# Patient Record
Sex: Male | Born: 1945 | Race: Black or African American | Hispanic: No | Marital: Single | Smoking: Current every day smoker
Health system: Southern US, Community
[De-identification: ages and names within clinical notes are randomized; demographics above are authoritative.]

## PROBLEM LIST (undated history)

## (undated) DIAGNOSIS — F1010 Alcohol abuse, uncomplicated: Secondary | ICD-10-CM

## (undated) DIAGNOSIS — E7211 Homocystinuria: Secondary | ICD-10-CM

## (undated) DIAGNOSIS — I10 Essential (primary) hypertension: Secondary | ICD-10-CM

## (undated) DIAGNOSIS — K219 Gastro-esophageal reflux disease without esophagitis: Secondary | ICD-10-CM

## (undated) DIAGNOSIS — I771 Stricture of artery: Secondary | ICD-10-CM

## (undated) DIAGNOSIS — I739 Peripheral vascular disease, unspecified: Secondary | ICD-10-CM

## (undated) DIAGNOSIS — N19 Unspecified kidney failure: Secondary | ICD-10-CM

## (undated) DIAGNOSIS — D126 Benign neoplasm of colon, unspecified: Secondary | ICD-10-CM

## (undated) DIAGNOSIS — Z720 Tobacco use: Secondary | ICD-10-CM

## (undated) DIAGNOSIS — E538 Deficiency of other specified B group vitamins: Secondary | ICD-10-CM

## (undated) DIAGNOSIS — N281 Cyst of kidney, acquired: Secondary | ICD-10-CM

## (undated) DIAGNOSIS — N289 Disorder of kidney and ureter, unspecified: Secondary | ICD-10-CM

## (undated) DIAGNOSIS — A048 Other specified bacterial intestinal infections: Secondary | ICD-10-CM

## (undated) DIAGNOSIS — I82409 Acute embolism and thrombosis of unspecified deep veins of unspecified lower extremity: Secondary | ICD-10-CM

## (undated) HISTORY — DX: Peripheral vascular disease, unspecified: I73.9

## (undated) HISTORY — PX: BACK SURGERY: SHX140

## (undated) HISTORY — DX: Gastro-esophageal reflux disease without esophagitis: K21.9

## (undated) HISTORY — DX: Disorder of kidney and ureter, unspecified: N28.9

## (undated) HISTORY — DX: Stricture of artery: I77.1

## (undated) HISTORY — DX: Tobacco use: Z72.0

## (undated) HISTORY — DX: Other specified bacterial intestinal infections: A04.8

## (undated) HISTORY — DX: Essential (primary) hypertension: I10

## (undated) HISTORY — DX: Cyst of kidney, acquired: N28.1

## (undated) HISTORY — DX: Acute embolism and thrombosis of unspecified deep veins of unspecified lower extremity: I82.409

## (undated) HISTORY — DX: Unspecified kidney failure: N19

## (undated) HISTORY — DX: Deficiency of other specified B group vitamins: E53.8

## (undated) HISTORY — DX: Alcohol abuse, uncomplicated: F10.10

## (undated) HISTORY — DX: Benign neoplasm of colon, unspecified: D12.6

## (undated) HISTORY — DX: Homocystinuria: E72.11

---

## 2006-03-24 ENCOUNTER — Inpatient Hospital Stay (HOSPITAL_COMMUNITY): Admission: EM | Admit: 2006-03-24 | Discharge: 2006-04-02 | Payer: Self-pay | Admitting: Emergency Medicine

## 2006-04-16 ENCOUNTER — Ambulatory Visit (HOSPITAL_COMMUNITY): Admission: RE | Admit: 2006-04-16 | Discharge: 2006-04-16 | Payer: Self-pay | Admitting: Urology

## 2006-05-12 ENCOUNTER — Ambulatory Visit (HOSPITAL_COMMUNITY): Admission: RE | Admit: 2006-05-12 | Discharge: 2006-05-12 | Payer: Self-pay | Admitting: Urology

## 2010-03-16 DIAGNOSIS — D126 Benign neoplasm of colon, unspecified: Secondary | ICD-10-CM

## 2010-03-16 DIAGNOSIS — I82409 Acute embolism and thrombosis of unspecified deep veins of unspecified lower extremity: Secondary | ICD-10-CM

## 2010-03-16 HISTORY — DX: Benign neoplasm of colon, unspecified: D12.6

## 2010-03-16 HISTORY — DX: Acute embolism and thrombosis of unspecified deep veins of unspecified lower extremity: I82.409

## 2010-06-11 ENCOUNTER — Inpatient Hospital Stay (HOSPITAL_COMMUNITY)
Admission: EM | Admit: 2010-06-11 | Discharge: 2010-06-13 | Disposition: A | Discharge status: Other Acute Inpatient Hospital | Payer: Medicaid Other | Source: Home / Self Care

## 2010-06-11 ENCOUNTER — Emergency Department (HOSPITAL_COMMUNITY): Payer: Medicaid Other

## 2010-06-11 DIAGNOSIS — I129 Hypertensive chronic kidney disease with stage 1 through stage 4 chronic kidney disease, or unspecified chronic kidney disease: Secondary | ICD-10-CM | POA: Diagnosis present

## 2010-06-11 DIAGNOSIS — I824Y9 Acute embolism and thrombosis of unspecified deep veins of unspecified proximal lower extremity: Secondary | ICD-10-CM | POA: Diagnosis present

## 2010-06-11 DIAGNOSIS — L98499 Non-pressure chronic ulcer of skin of other sites with unspecified severity: Secondary | ICD-10-CM | POA: Diagnosis present

## 2010-06-11 DIAGNOSIS — L02419 Cutaneous abscess of limb, unspecified: Secondary | ICD-10-CM | POA: Diagnosis present

## 2010-06-11 DIAGNOSIS — N179 Acute kidney failure, unspecified: Secondary | ICD-10-CM | POA: Diagnosis not present

## 2010-06-11 DIAGNOSIS — L03119 Cellulitis of unspecified part of limb: Secondary | ICD-10-CM | POA: Diagnosis present

## 2010-06-11 DIAGNOSIS — F17200 Nicotine dependence, unspecified, uncomplicated: Secondary | ICD-10-CM | POA: Diagnosis present

## 2010-06-11 DIAGNOSIS — F1010 Alcohol abuse, uncomplicated: Secondary | ICD-10-CM | POA: Diagnosis present

## 2010-06-11 DIAGNOSIS — I739 Peripheral vascular disease, unspecified: Secondary | ICD-10-CM | POA: Diagnosis present

## 2010-06-11 DIAGNOSIS — N189 Chronic kidney disease, unspecified: Secondary | ICD-10-CM | POA: Diagnosis present

## 2010-06-11 DIAGNOSIS — K219 Gastro-esophageal reflux disease without esophagitis: Secondary | ICD-10-CM | POA: Diagnosis present

## 2010-06-11 DIAGNOSIS — L97309 Non-pressure chronic ulcer of unspecified ankle with unspecified severity: Secondary | ICD-10-CM | POA: Diagnosis present

## 2010-06-11 DIAGNOSIS — L97809 Non-pressure chronic ulcer of other part of unspecified lower leg with unspecified severity: Secondary | ICD-10-CM | POA: Diagnosis present

## 2010-06-11 DIAGNOSIS — N183 Chronic kidney disease, stage 3 unspecified: Secondary | ICD-10-CM | POA: Diagnosis present

## 2010-06-11 LAB — CBC
HCT: 42.6 % (ref 39.0–52.0)
MCH: 32.4 pg (ref 26.0–34.0)
MCV: 97.9 fL (ref 78.0–100.0)
Platelets: 319 10*3/uL (ref 150–400)
RBC: 4.35 MIL/uL (ref 4.22–5.81)
RDW: 15.2 % (ref 11.5–15.5)
WBC: 5.3 10*3/uL (ref 4.0–10.5)

## 2010-06-11 LAB — BASIC METABOLIC PANEL
CO2: 25 mEq/L (ref 19–32)
Calcium: 9.6 mg/dL (ref 8.4–10.5)
Chloride: 104 mEq/L (ref 96–112)
Creatinine, Ser: 1.57 mg/dL — ABNORMAL HIGH (ref 0.4–1.5)
Potassium: 4.7 mEq/L (ref 3.5–5.1)

## 2010-06-11 LAB — DIFFERENTIAL
Eosinophils Relative: 3 % (ref 0–5)
Lymphocytes Relative: 26 % (ref 12–46)
Monocytes Relative: 10 % (ref 3–12)
Neutro Abs: 3.2 10*3/uL (ref 1.7–7.7)
Neutrophils Relative %: 60 % (ref 43–77)

## 2010-06-11 LAB — PROTIME-INR: INR: 0.91 (ref 0.00–1.49)

## 2010-06-11 NOTE — H&P (Addendum)
NAME:  Justin Gilmore, Justin Gilmore NO.:  [id number]  MEDICAL RECORD NO.:  [id number]           PATIENT TYPE:  E  LOCATION:  ED                            FACILITY:  [facility]  PHYSICIAN:  Talmage Nap, MD  DATE OF BIRTH:  [birth year]  DATE OF ADMISSION:  06/11/2010 DATE OF DISCHARGE:  LH                             HISTORY & PHYSICAL   PRIMARY CARE PHYSICIAN:  Unassigned and history obtainable from the patient and the patient's family.  CHIEF COMPLAINT:  Swelling and redness of the lower extremities on and off for about a month.  HISTORY OF PRESENT ILLNESS:  The patient is a 65 year old African American male with no known medical history presenting to the emergency room with swelling of the lower extremity with redness which according to family member has been on and off for about a month and this was said to have gotten progressively worse in the past 5 days prior to presenting to the emergency room.  Baseline, the patient is able to ambulate, however, with the progression of the swelling of the lower extremity the patient was having extreme difficulty trying to ambulate. He, however, denied any history of chest pain.  He denied any history of shortness of breath.  He denied any systemic symptoms i.e. no fever, no chills, no rigor.  He was initially treated by the orthopedic surgeon as an outpatient but the swelling and the redness continued to get progressively worse, hence he was advised by the orthopedic surgeon to come to the emergency room to be evaluated.  PAST MEDICAL HISTORY:  None.  PAST SURGICAL HISTORY:  Cyst excision from the lower back.  MEDICATIONS:  He is not on any medications for now.  ALLERGIES:  He has no known allergies.  SOCIAL HISTORY:  The patient smokes about two packs of cigarettes per day in the past 30 years and also takes hard liquor and regular alcohol and is currently on disability.  FAMILY HISTORY:  Positive for hypertension and  coronary artery disease.  REVIEW OF SYSTEMS:  The patient denies any history of headaches.  No blurred vision.  No nausea or vomiting.  No fever.  No chills.  No rigor.  Denies any chest pain or shortness of breath.  No cough.  No abdominal discomfort.  No diarrhea or hematochezia.  No dysuria or hematuria.  Has swelling of the right lower extremity with difficulty in ambulation with a lot of erythema.  No intolerance to heat or cold and no neuropsychiatric disorder.  PHYSICAL EXAMINATION:  GENERAL:  Elderly man not in any obvious respiratory distress looking slightly unkempt. VITAL SIGNS:  Blood pressure is 172/94, pulse is 62, respiratory rate 20, temperature is 98.5. HEENT:  Pupils are reactive to light and extraocular muscles are intact. NECK:  No jugular venous distention.  No carotid bruit.  No lymphadenopathy. CHEST:  Clear to auscultation. HEART:  Sounds are 1 and 2. ABDOMEN:  Soft, nontender.  Liver, spleen and kidney not palpable. Bowel sounds are positive. EXTREMITIES:  Swelling of the right lower leg with irregular erythema slightly warm to touch.  Denna Haggard' sign not  elicited.  Slight tenderness in the calf. NEUROPSYCHIATRIC:  Unremarkable. SKIN:  Normal turgor. MUSCULOSKELETAL:  Arthritic changes in the knees and in the feet.  LABORATORY DATA:  Initial anticoagulation profile done on the patient showed PT 12.5, INR 0.91.  Hematological indices showed WBC of 5.3, hemoglobin of 14.1, hematocrit of 42.6, MCV of 97.1, platelet count of 309, neutrophil is 60, and absolute granulocyte count is 3.2.  Chemistry showed sodium of 136, potassium of 4.7, chloride of 104 with a bicarb of 75, glucose is 93, BUN is 17, creatinine is 1.57.  Imaging studies done on the patient include a venous duplex of the right lower extremity and showed partial occlusive mural thrombus through the superficial femoral vein and occlusive thrombus in the distal, superficial and femoral vein, partial  occlusive mural thrombus in the popliteal vein and the posterior tibial vein, extensive DVT of the femoral popliteal veins.  Chest x-ray showed hyperaeration, no active lung disease.  There is a vague nodular opacification at the right lung base which may be due to overlapping bony and vascular structure and there is coronary artery calcification.  IMPRESSION: 1. Deep vein thrombosis right leg. 2. Venous stasis right lower leg. 3. Cellulitis of the right lower leg. 4. Elevated blood pressure. 5. Chronic tobacco use.  PLAN:  To admit the patient to general medical floor.  The patient will be anticoagulated with Lovenox 1 mg per kg subcu q. 12 hourly and Coumadin p.o. dosing to be done by pharmacy, pain control will be done with Dilaudid 1 mg IV q. 4 p.r.n.  The patient's venous stasis will be treated with Lasix 40 mg IV q. 24 start dose and this will be followed by hydrochlorothiazide 25 mg p.o. daily.  He will also be on Zosyn 3.375 grams IV q. 8 hourly for treatment of cellulitis.  Since the patient expressed his willingness to quit smoking, he will be on nicotine patch 14 mg q.24 hours transdermal and he will also be given Protonix 40 mg p.o. daily for GI prophylaxis.  Further workup to be done on this patient will include repeating coagulation profile that is PT/PTT, INR in a.m., CBCD, CMP and magnesium will be repeated in a.m.  The patient will be followed and evaluated on a day-to-day basis.     Talmage Nap, MD     CN/MEDQ  D:  06/11/2010  T:  06/11/2010  Job:  [id number]  Electronically Signed by Talmage Nap  on 06/11/2010 11:39:11 PM

## 2010-06-12 ENCOUNTER — Inpatient Hospital Stay (HOSPITAL_COMMUNITY): Payer: Medicaid Other

## 2010-06-12 LAB — COMPREHENSIVE METABOLIC PANEL
ALT: 9 U/L (ref 0–53)
Albumin: 2.9 g/dL — ABNORMAL LOW (ref 3.5–5.2)
Alkaline Phosphatase: 65 U/L (ref 39–117)
CO2: 21 mEq/L (ref 19–32)
Chloride: 107 mEq/L (ref 96–112)
Creatinine, Ser: 1.57 mg/dL — ABNORMAL HIGH (ref 0.4–1.5)
GFR calc Af Amer: 54 mL/min — ABNORMAL LOW (ref >60)
GFR calc non Af Amer: 45 mL/min — ABNORMAL LOW (ref >60)
Sodium: 137 mEq/L (ref 135–145)
Total Protein: 6.2 g/dL (ref 6.0–8.3)

## 2010-06-12 LAB — DIFFERENTIAL
Basophils Absolute: 0 10*3/uL (ref 0.0–0.1)
Lymphocytes Relative: 23 % (ref 12–46)
Neutro Abs: 3 10*3/uL (ref 1.7–7.7)
Neutrophils Relative %: 64 % (ref 43–77)

## 2010-06-12 LAB — CBC
Hemoglobin: 13.3 g/dL (ref 13.0–17.0)
MCHC: 32.8 g/dL (ref 30.0–36.0)
RDW: 15.2 % (ref 11.5–15.5)

## 2010-06-13 ENCOUNTER — Inpatient Hospital Stay (HOSPITAL_COMMUNITY)
Admission: EM | Admit: 2010-06-13 | Discharge: 2010-06-23 | DRG: 253 | Disposition: A | Discharge status: Home Health | Payer: Medicaid Other | Source: Other Acute Inpatient Hospital | Attending: Family Medicine | Admitting: Family Medicine

## 2010-06-13 ENCOUNTER — Encounter: Payer: Self-pay | Admitting: Vascular Surgery

## 2010-06-13 LAB — BASIC METABOLIC PANEL
CO2: 24 mEq/L (ref 19–32)
Chloride: 103 mEq/L (ref 96–112)
Creatinine, Ser: 1.78 mg/dL — ABNORMAL HIGH (ref 0.4–1.5)
GFR calc non Af Amer: 39 mL/min — ABNORMAL LOW (ref >60)
Glucose, Bld: 108 mg/dL — ABNORMAL HIGH (ref 70–99)
Potassium: 4.2 mEq/L (ref 3.5–5.1)

## 2010-06-13 LAB — CBC
HCT: 39 % (ref 39.0–52.0)
Hemoglobin: 13 g/dL (ref 13.0–17.0)
MCH: 32.3 pg (ref 26.0–34.0)
MCHC: 33.3 g/dL (ref 30.0–36.0)
MCV: 97 fL (ref 78.0–100.0)
RBC: 4.02 MIL/uL — ABNORMAL LOW (ref 4.22–5.81)

## 2010-06-13 LAB — DIFFERENTIAL
Basophils Absolute: 0 10*3/uL (ref 0.0–0.1)
Basophils Relative: 0 % (ref 0–1)
Eosinophils Relative: 3 % (ref 0–5)
Monocytes Absolute: 0.8 10*3/uL (ref 0.1–1.0)
Neutro Abs: 4.2 10*3/uL (ref 1.7–7.7)
Neutrophils Relative %: 62 % (ref 43–77)

## 2010-06-13 LAB — PROTIME-INR
INR: 1.03 (ref 0.00–1.49)
Prothrombin Time: 13.7 seconds (ref 11.6–15.2)

## 2010-06-14 DIAGNOSIS — I824Z9 Acute embolism and thrombosis of unspecified deep veins of unspecified distal lower extremity: Secondary | ICD-10-CM

## 2010-06-14 DIAGNOSIS — L97309 Non-pressure chronic ulcer of unspecified ankle with unspecified severity: Secondary | ICD-10-CM

## 2010-06-14 LAB — URINALYSIS, ROUTINE W REFLEX MICROSCOPIC
Hgb urine dipstick: NEGATIVE
Nitrite: NEGATIVE
Protein, ur: NEGATIVE mg/dL
Specific Gravity, Urine: 1.015 (ref 1.005–1.030)
Urobilinogen, UA: 0.2 mg/dL (ref 0.0–1.0)

## 2010-06-14 LAB — PROTIME-INR
INR: 1.25 (ref 0.00–1.49)
Prothrombin Time: 15.9 s — ABNORMAL HIGH (ref 11.6–15.2)

## 2010-06-14 LAB — BASIC METABOLIC PANEL
Chloride: 105 mEq/L (ref 96–112)
Creatinine, Ser: 1.63 mg/dL — ABNORMAL HIGH (ref 0.4–1.5)
GFR calc Af Amer: 52 mL/min — ABNORMAL LOW (ref >60)
GFR calc non Af Amer: 43 mL/min — ABNORMAL LOW (ref >60)
Glucose, Bld: 110 mg/dL — ABNORMAL HIGH (ref 70–99)
Potassium: 4.3 mEq/L (ref 3.5–5.1)

## 2010-06-14 LAB — CBC
HCT: 37.4 % — ABNORMAL LOW (ref 39.0–52.0)
Hemoglobin: 12.4 g/dL — ABNORMAL LOW (ref 13.0–17.0)
MCH: 31.6 pg (ref 26.0–34.0)
MCHC: 33.2 g/dL (ref 30.0–36.0)
MCV: 95.2 fL (ref 78.0–100.0)
RBC: 3.93 MIL/uL — ABNORMAL LOW (ref 4.22–5.81)
RDW: 14.5 % (ref 11.5–15.5)

## 2010-06-14 LAB — LIPID PANEL
Cholesterol: 160 mg/dL (ref 0–200)
HDL: 55 mg/dL (ref >39)
Total CHOL/HDL Ratio: 2.9 RATIO
VLDL: 20 mg/dL (ref 0–40)

## 2010-06-15 DIAGNOSIS — I739 Peripheral vascular disease, unspecified: Secondary | ICD-10-CM

## 2010-06-15 DIAGNOSIS — L98499 Non-pressure chronic ulcer of skin of other sites with unspecified severity: Secondary | ICD-10-CM

## 2010-06-15 DIAGNOSIS — I824Z9 Acute embolism and thrombosis of unspecified deep veins of unspecified distal lower extremity: Secondary | ICD-10-CM

## 2010-06-15 DIAGNOSIS — L97309 Non-pressure chronic ulcer of unspecified ankle with unspecified severity: Secondary | ICD-10-CM

## 2010-06-15 LAB — CBC
MCV: 95.8 fL (ref 78.0–100.0)
Platelets: 249 10*3/uL (ref 150–400)
RBC: 3.55 MIL/uL — ABNORMAL LOW (ref 4.22–5.81)
RDW: 14.4 % (ref 11.5–15.5)
WBC: 7 10*3/uL (ref 4.0–10.5)

## 2010-06-15 LAB — BASIC METABOLIC PANEL
CO2: 23 mEq/L (ref 19–32)
Calcium: 8.3 mg/dL — ABNORMAL LOW (ref 8.4–10.5)
Creatinine, Ser: 1.49 mg/dL (ref 0.4–1.5)
GFR calc Af Amer: 58 mL/min — ABNORMAL LOW (ref >60)
GFR calc non Af Amer: 48 mL/min — ABNORMAL LOW (ref >60)
Sodium: 133 mEq/L — ABNORMAL LOW (ref 135–145)

## 2010-06-15 LAB — PROTIME-INR: Prothrombin Time: 15.4 seconds — ABNORMAL HIGH (ref 11.6–15.2)

## 2010-06-16 DIAGNOSIS — Z86718 Personal history of other venous thrombosis and embolism: Secondary | ICD-10-CM

## 2010-06-16 DIAGNOSIS — I803 Phlebitis and thrombophlebitis of lower extremities, unspecified: Secondary | ICD-10-CM

## 2010-06-16 LAB — CBC
MCH: 31.7 pg (ref 26.0–34.0)
MCHC: 33 g/dL (ref 30.0–36.0)
Platelets: 230 10*3/uL (ref 150–400)
RBC: 3.5 MIL/uL — ABNORMAL LOW (ref 4.22–5.81)

## 2010-06-16 LAB — PROTIME-INR
INR: 1.1 (ref 0.00–1.49)
Prothrombin Time: 14.4 seconds (ref 11.6–15.2)

## 2010-06-16 LAB — BASIC METABOLIC PANEL
CO2: 24 mEq/L (ref 19–32)
Creatinine, Ser: 1.51 mg/dL — ABNORMAL HIGH (ref 0.4–1.5)

## 2010-06-16 NOTE — Consult Note (Signed)
NAME:  Justin Gilmore, Justin Gilmore NO.:  [id number]  MEDICAL RECORD NO.:  [id number]           PATIENT TYPE:  I  LOCATION:  5508                         FACILITY:  [facility]  PHYSICIAN:  Quita Skye. Hart Rochester, M.D.  DATE OF BIRTH:  Aug 02, 1945  DATE OF CONSULTATION:  06/14/2010 DATE OF DISCHARGE:                                CONSULTATION   CHIEF COMPLAINT:  Nonhealing ulcer right ankle and recent deep vein thrombosis right leg.  HISTORY OF PRESENT ILLNESS:  A 65 year old male patient states that he has been having swelling in the right leg for the last 4-6 weeks and developed an ulcer on the lateral aspect of his right ankle about the same time period.  He denies trauma to this area or previous ulcerations.  He does admit to severe claudication in the right leg limiting him to walking 1-2 blocks over the last several months.  He has no symptoms on the contralateral left leg.  He was admitted to [facility] where a venous duplex exam revealed a DVT in the right superficial, femoral and popliteal veins and he was started on anticoagulation.  He also had an arterial evaluation with an ABI of 0.24 on the right and 0.84 on the left with evidence of right superficial femoral occlusion on CT angiogram.  He was referred for further evaluation.  CHRONIC MEDICAL PROBLEMS: 1. Hypertension. 2. Tobacco abuse.  Admits to 1-2 packs of cigarettes per day for 50+     years. 3. History of renal cyst. 4. Renal dysfunction, creatinine at 1.5-1.8 range. 5. Negative for coronary artery disease, diabetes or stroke.  SOCIAL HISTORY:  The patient admits to 1-2 packs cigarettes per day. Drinks frequent alcohol and beer primarily.  FAMILY HISTORY:  Positive for diabetes in a brother.  Negative for coronary artery disease or stroke.  REVIEW OF SYSTEMS:  The patient denies any chest pain, dyspnea on exertion, PND, orthopnea, or chronic bronchitis.  Does have severe right leg pain as noted  with ambulation.  Denies all other symptoms in a complete review of systems.  PHYSICAL EXAMINATION:  VITAL SIGNS:  Blood pressure is 140/70, heart rate 70, respirations 14, temperature 98. GENERAL:  He is a thin African American male in no apparent distress, alert and oriented x3. HEENT:  Normal for age.  EOMs intact. LUNGS:  Clear to auscultation.  No rhonchi or wheezing. CARDIOVASCULAR:  Regular rhythm.  No murmurs.  Carotid pulses are 3+. No audible bruits. ABDOMEN:  Soft, nontender with no masses. NEUROLOGIC:  Normal. MUSCULOSKELETAL:  Free of major deformities. LOWER EXTREMITY:  Left leg has a 2+ femoral, popliteal and dorsalis pedis pulse palpable.  Right leg has a 2+ femoral, absent popliteal and distal pulses.  There is a dry eschar adjacent to the right lateral malleolus measuring 2.5 x 4 cm with no exposed bone or purulent drainage.  He has multiple superficial pretibial ulcers in the right leg between the knee and the ankle with no pressure sores on the heel.  He does have 1+ to 2+ edema in the right leg from mid thigh to the foot. No  edema in the left leg.  IMPRESSION: 1. Recent onset of deep vein thrombosis in the right leg. 2. Ischemic ulcer right ankle. 3. Renal insufficiency with creatinine clearance of 48 mL per minute. 4. History of tobacco abuse.  PLAN: 1. The patient needs IV hydration to maximize his renal function. 2. We will plan an angiogram with possible intervention by Dr. Myra Gianotti     next week, if feasible to try to salvage his right lower extremity. 3. Repeat a venous duplex exam on Monday to find out current status of     DVT.  The patient is at high risk of requiring an amputation     because of DVT and arterial insufficiency with renal insufficiency     and an eschar adjacent to the lateral malleolus.  Discussed this at     length with the family at side.     Quita Skye Hart Rochester, M.D.     JDL/MEDQ  D:  06/14/2010  T:  06/15/2010  Job:   [id number]  Electronically Signed by Josephina Gip M.D. on 06/16/2010 10:26:06 AM

## 2010-06-17 DIAGNOSIS — I739 Peripheral vascular disease, unspecified: Secondary | ICD-10-CM

## 2010-06-17 DIAGNOSIS — L98499 Non-pressure chronic ulcer of skin of other sites with unspecified severity: Secondary | ICD-10-CM

## 2010-06-17 LAB — BASIC METABOLIC PANEL
CO2: 24 mEq/L (ref 19–32)
Chloride: 107 mEq/L (ref 96–112)
Glucose, Bld: 99 mg/dL (ref 70–99)
Potassium: 3.9 mEq/L (ref 3.5–5.1)
Sodium: 137 mEq/L (ref 135–145)

## 2010-06-18 DIAGNOSIS — I739 Peripheral vascular disease, unspecified: Secondary | ICD-10-CM

## 2010-06-18 DIAGNOSIS — L98499 Non-pressure chronic ulcer of skin of other sites with unspecified severity: Secondary | ICD-10-CM

## 2010-06-18 LAB — CBC
HCT: 31.7 % — ABNORMAL LOW (ref 39.0–52.0)
Hemoglobin: 10.7 g/dL — ABNORMAL LOW (ref 13.0–17.0)
MCH: 31.9 pg (ref 26.0–34.0)
MCHC: 33.8 g/dL (ref 30.0–36.0)
MCV: 94.6 fL (ref 78.0–100.0)
RBC: 3.35 MIL/uL — ABNORMAL LOW (ref 4.22–5.81)

## 2010-06-18 LAB — BASIC METABOLIC PANEL
CO2: 26 mEq/L (ref 19–32)
Calcium: 8.4 mg/dL (ref 8.4–10.5)
Chloride: 105 mEq/L (ref 96–112)
Creatinine, Ser: 1.47 mg/dL (ref 0.4–1.5)
Glucose, Bld: 106 mg/dL — ABNORMAL HIGH (ref 70–99)

## 2010-06-19 LAB — COMPREHENSIVE METABOLIC PANEL
ALT: 25 U/L (ref 0–53)
Alkaline Phosphatase: 63 U/L (ref 39–117)
BUN: 9 mg/dL (ref 6–23)
CO2: 23 mEq/L (ref 19–32)
Calcium: 8.4 mg/dL (ref 8.4–10.5)
GFR calc non Af Amer: 46 mL/min — ABNORMAL LOW (ref >60)
Glucose, Bld: 102 mg/dL — ABNORMAL HIGH (ref 70–99)
Potassium: 3.9 mEq/L (ref 3.5–5.1)
Sodium: 136 mEq/L (ref 135–145)
Total Protein: 5.6 g/dL — ABNORMAL LOW (ref 6.0–8.3)

## 2010-06-19 LAB — CBC
MCH: 31.6 pg (ref 26.0–34.0)
MCHC: 33.2 g/dL (ref 30.0–36.0)
MCV: 95.1 fL (ref 78.0–100.0)
Platelets: 291 10*3/uL (ref 150–400)

## 2010-06-19 LAB — PROTIME-INR: Prothrombin Time: 14.4 seconds (ref 11.6–15.2)

## 2010-06-19 LAB — HEPARIN LEVEL (UNFRACTIONATED): Heparin Unfractionated: 0.3 IU/mL (ref 0.30–0.70)

## 2010-06-20 ENCOUNTER — Other Ambulatory Visit: Payer: Self-pay | Admitting: Vascular Surgery

## 2010-06-20 DIAGNOSIS — I743 Embolism and thrombosis of arteries of the lower extremities: Secondary | ICD-10-CM

## 2010-06-20 DIAGNOSIS — I70219 Atherosclerosis of native arteries of extremities with intermittent claudication, unspecified extremity: Secondary | ICD-10-CM

## 2010-06-20 HISTORY — PX: FEMORAL-POPLITEAL BYPASS GRAFT: SHX937

## 2010-06-20 LAB — CBC
HCT: 31 % — ABNORMAL LOW (ref 39.0–52.0)
Hemoglobin: 10.2 g/dL — ABNORMAL LOW (ref 13.0–17.0)
Hemoglobin: 10.5 g/dL — ABNORMAL LOW (ref 13.0–17.0)
MCH: 32 pg (ref 26.0–34.0)
MCV: 95.4 fL (ref 78.0–100.0)
Platelets: 279 10*3/uL (ref 150–400)
RBC: 3.25 MIL/uL — ABNORMAL LOW (ref 4.22–5.81)
RBC: 3.28 MIL/uL — ABNORMAL LOW (ref 4.22–5.81)
RDW: 14.7 % (ref 11.5–15.5)
WBC: 5.8 10*3/uL (ref 4.0–10.5)

## 2010-06-20 LAB — BASIC METABOLIC PANEL
BUN: 7 mg/dL (ref 6–23)
CO2: 24 mEq/L (ref 19–32)
Calcium: 8.7 mg/dL (ref 8.4–10.5)
Calcium: 8.6 mg/dL (ref 8.4–10.5)
Chloride: 108 mEq/L (ref 96–112)
GFR calc Af Amer: 59 mL/min — ABNORMAL LOW (ref >60)
GFR calc non Af Amer: 44 mL/min — ABNORMAL LOW (ref >60)
Glucose, Bld: 115 mg/dL — ABNORMAL HIGH (ref 70–99)
Glucose, Bld: 116 mg/dL — ABNORMAL HIGH (ref 70–99)
Potassium: 3.9 mEq/L (ref 3.5–5.1)
Potassium: 4 mEq/L (ref 3.5–5.1)
Sodium: 137 mEq/L (ref 135–145)

## 2010-06-20 LAB — HEMOGLOBIN A1C: Mean Plasma Glucose: 120 mg/dL — ABNORMAL HIGH (ref <117)

## 2010-06-20 LAB — MRSA PCR SCREENING: MRSA by PCR: NEGATIVE

## 2010-06-20 LAB — GLUCOSE, CAPILLARY: Glucose-Capillary: 107 mg/dL — ABNORMAL HIGH (ref 70–99)

## 2010-06-20 LAB — SURGICAL PCR SCREEN: MRSA, PCR: NEGATIVE

## 2010-06-21 LAB — BASIC METABOLIC PANEL
BUN: 6 mg/dL (ref 6–23)
Calcium: 8.5 mg/dL (ref 8.4–10.5)
Chloride: 105 mEq/L (ref 96–112)
Creatinine, Ser: 1.56 mg/dL — ABNORMAL HIGH (ref 0.4–1.5)
GFR calc Af Amer: 55 mL/min — ABNORMAL LOW (ref >60)
GFR calc non Af Amer: 45 mL/min — ABNORMAL LOW (ref >60)

## 2010-06-22 LAB — COMPREHENSIVE METABOLIC PANEL
ALT: 16 U/L (ref 0–53)
AST: 16 U/L (ref 0–37)
CO2: 24 mEq/L (ref 19–32)
Calcium: 8.7 mg/dL (ref 8.4–10.5)
Creatinine, Ser: 1.73 mg/dL — ABNORMAL HIGH (ref 0.4–1.5)
GFR calc Af Amer: 49 mL/min — ABNORMAL LOW (ref >60)
GFR calc non Af Amer: 40 mL/min — ABNORMAL LOW (ref >60)
Sodium: 135 mEq/L (ref 135–145)
Total Protein: 5.8 g/dL — ABNORMAL LOW (ref 6.0–8.3)

## 2010-06-22 LAB — CBC
Platelets: 268 10*3/uL (ref 150–400)
RBC: 3.01 MIL/uL — ABNORMAL LOW (ref 4.22–5.81)
RDW: 15.1 % (ref 11.5–15.5)
WBC: 8.3 10*3/uL (ref 4.0–10.5)

## 2010-06-22 LAB — CROSSMATCH
ABO/RH(D): O POS
Unit division: 0

## 2010-06-22 LAB — PROTIME-INR
INR: 1.12 (ref 0.00–1.49)
Prothrombin Time: 14.6 seconds (ref 11.6–15.2)

## 2010-06-23 LAB — BASIC METABOLIC PANEL
BUN: 8 mg/dL (ref 6–23)
CO2: 24 mEq/L (ref 19–32)
Chloride: 103 mEq/L (ref 96–112)
GFR calc Af Amer: 52 mL/min — ABNORMAL LOW (ref >60)
Potassium: 3.7 mEq/L (ref 3.5–5.1)

## 2010-06-23 LAB — CBC
HCT: 28.7 % — ABNORMAL LOW (ref 39.0–52.0)
Hemoglobin: 9.5 g/dL — ABNORMAL LOW (ref 13.0–17.0)
MCHC: 33.1 g/dL (ref 30.0–36.0)
MCV: 93.5 fL (ref 78.0–100.0)

## 2010-06-23 LAB — PROTIME-INR: INR: 1.17 (ref 0.00–1.49)

## 2010-06-28 NOTE — Discharge Summary (Signed)
NAME:  Justin Gilmore, Justin Gilmore NO.:  [id number]  MEDICAL RECORD NO.:  [id number]           PATIENT TYPE:  LOCATION:                                 FACILITY:  PHYSICIAN:  Pleas Koch, MD        DATE OF BIRTH:  02/09/1946  DATE OF ADMISSION:  06/11/2010 DATE OF DISCHARGE:  06/23/2010                              DISCHARGE SUMMARY   PERTINENT DISCHARGE DIAGNOSES: 1. Right lower extremity deep vein thrombosis. 2. Day #3 status post right common femoral artery endarterectomy and     extended profundoplasty with right common femoral artery to above-     knee popliteal bypass with probe patent graft. 3. Possible cellulitis of the above. 4. Gastroesophageal reflux disease. 5. Diabetes mellitus, A1c 5.8. 6. Hypertension, moderately controlled. 7. Significant alcohol and tobacco abuse in the past. 8. 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  PERTINENT PROCEDURES DONE: 1. Ultrasound accessed left femoral artery. 2. Abdominal aortogram. 3. Right common femoral artery endarterectomy with extended     profundoplasty. 4. Right common artery to above knee popliteal bypass pin probe by Dr.     Leonides Sake dated June 20, 2010.  DISCHARGE MEDICATIONS:  Are as follow: 1. Lovenox 80 mg b.i.d. for quantity sufficient for 5 days,     prescription written. 2. NicoDerm patch 14 mg over 24 hours daily. 3. Nicotine patch 14 mg over 24 hours, quantity sufficient for 1     month, 30 given. 4. Metoprolol 50 mg p.o. b.i.d., Lopressor. 5. Bisacodyl 5 mg tablets, take 10 mg daily p.r.n., quantity     sufficient given for 1 month. 6. Amlodipine 10 mg 1 tablet daily. 7. Keflex 500 mg b.i.d.  Diagnosis, cellulitis.  The patient is to     take this for 4 days and quantity would be 8 and to complete a 7-     day course of treatment. 8. Oxycodone 5 mg IR q.4 p.r.n.  The patient was given a 5-day     prescription of this, which would equal 30 tablets. 9. Tramadol 50 mg tablets p.o. q.6 p.r.n.   The patient was given a 14-     day prescription of this  equalling quantity of 54 tablets. 10.The patient is instructed to discontinue taking Naprosyn.  The     patient is instructed to take collagenous ointment one application     b.i.d. to lower extremity, quantity sufficient for 5 days. 11.Hydrochlorothiazide 25 mg 1 tablet daily.  PERTINENT IMAGING STUDIES: 1. Ultrasound venous Doppler done May 13, 2010, showed extensive     femoral popliteal DVT, probably significant chronic component.     There is partial occlusive mural thrombus through the superficial     femoral vein.  Occlusive thrombus and distal superficial femoral     vein.  Partially occlusive mural thrombus popliteal vein, posterior     tibial vein.  Chest x-ray two-view on June 11, 2010, equal sign,     hyperaeration, no active disease. 2. Vague nodular opacity of right lung base, may be due to overlapping     bony and  vascular structures.  Recommend followup x-ray to assess     stability. 3. Coronary artery calcifications. 4. 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  The patient     failed conservative treatment, consider CT and abdominal, aorta,     and lower extremities to better define side and nature of the     arterial occlusive disease and delineate treatment options.  BRIEF HOSPITAL COURSE:  Briefly, this is a pleasant 65 year old male with no diagnosed medical diseases in the past, who has been having lower extremity swelling on and off for about a month that got worse over the past 5 days.  He presented to the emergency room at [facility] and [facility] pain and was referred to Dr. Hilda Lias, who referred him then to the emergency room subsequently.  The patient has already been scheduled to have Vascular Surgery see him, but never made the appointment  and it was noted that on the lateral malleolus, he had black eschar with no drainage measuring about 3 x 4 cm area proximal to that about 4.5 x 2 cm and the third in the pretibial region measuring 4.5 x 5 cm.  The patient had tight shiny skin, significant erythema and warmth, and arterial Doppler done showed DVT.  Arterial studies also confirmed arterial disease as well.  He is placed on Lasix and metoprolol and was on Lovenox, but subsequently was discussed by Dr. Lendell Caprice and Dr. Hart Rochester and subsequently transferred over to [facility] for further management and care.  The patient stayed on at [facility] until June 20, 2010, when he got the procedure as mentioned above.  HOSPITAL COURSE: 1. Day 3 status post endarterectomy and profundoplasty.  The patient     had been seen by Dr. Imogene Burn who recommended the patient can go home.     The patient will continue on Santyl ointment.  The patient will     also need to get Keflex for empiric coverage.  It is noted that the     patient is ambulating well, but will need PT/OT.  Dr. Imogene Burn has     reported to me that the patient will probably need revisit of lower     extremity and may need surgical debridement versus amputation in     the future.  This will have to be discussed with him and the family     as an outpatient.  The patient is stable from our standpoint and     will continue on Santyl to debride the area. 2. Right lower extremity DVT.  His INR is still subtherapeutic at     1.17, and will need overlap supply for 4 days.  He will start     Coumadin on discharge, may be 5 mg and follow up with PCP as an     outpatient.  The patient will need an INR in 3-5 days to determine     level of what his anticoagulation would be.  I have discussed this     with family and with the patient extensively, and the patient will     need to be on Coumadin at least for 6 months. 3. Cellulitis.  I have discussed this with Dr. Imogene Burn and he feels that      the patient should be on empiric coverage for cellulitis as an  outpatient just for a 7-day course subsequent to antibiotics.     Please note the patient is on vanc and Zosyn on admission and     continue this until the day of surgery. 4. Gastroesophageal reflux.  The patient has a history of this and can     take over-the-counter Tums. 5. Hypertension.  The patient will continue on Norvasc 10 OD,     metoprolol 50 b.i.d., and hydrochlorothiazide 25 mg daily as he has     had a history of accelerated hypertension in the past. 6. Ethanol and tobacco abuse.  Ethanol abuse, the patient actually     requested alcohol while in the hospital and we agreed to this and     gave him 1 dose of whiskey to bedside.  The patient has been on     tobacco patch while here and will need tobacco and alcohol     counseling as an outpatient.  He will get tobacco counseling prior     to discharge.  I have mentioned to him that smoking and drinking     would cause him to have peripheral vascular disease.  The patient     is understanding same.  The patient is discharged in stable state.     Temperature was 99, pulse was 60, respirations were 19-20, and     blood pressures 143-147/68-71. 7. CKD, stage II to III.  His renal function was relatively stable     while in the hospital.  His last BUN/creatinine was 8/1.62, this is     likely attributable to his poorly-controlled hypertension.  It was a pleasure taking care of this patient.          ______________________________ Pleas Koch, MD     JS/MEDQ  D:  06/23/2010  T:  06/23/2010  Job:  [id number]  Electronically Signed by Pleas Koch MD on 06/28/2010 07:16:12 AM

## 2010-07-01 NOTE — Op Note (Signed)
NAME:  Justin Gilmore, MCSHAN NO.:  [id number]  MEDICAL RECORD NO.:  [id number]           PATIENT TYPE:  LOCATION:                                 FACILITY:  PHYSICIAN:  Fransisco Hertz, MD       DATE OF BIRTH:  04/17/1945  DATE OF PROCEDURE:  06/20/2010 DATE OF DISCHARGE:                              OPERATIVE REPORT   PROCEDURES: 1. Right common femoral artery endarterectomy with extended     profundoplasty. 2. A right common femoral artery to above-knee popliteal bypass with     probe patent graft.  PREOPERATIVE DIAGNOSIS:  Peripheral arterial disease and nonhealing ulcers of left lower leg.  POSTOPERATIVE DIAGNOSIS:  Peripheral arterial disease and nonhealing ulcers of left lower leg.  SURGEON:  Arlys John L. Imogene Burn, MD  ASSISTANT:  Pecola Leisure, PA  ANESTHESIA:  General.  FINDINGS: dopplerable right posterior tibial and anterior tibial.  SPECIMEN:  Plaque which was sent to Pathology.  ESTIMATED BLOOD LOSS:  200 mL.  INDICATIONS:  This is a 65 year old gentleman that presented with non-healing ulcers in the right leg.  He underwent an angiogram which demonstrated an occlusion of his right superficial femoral artery with above-knee reconstitution.  He underwent vein mapping in his left thigh. His greater saphenous vein, however, was found to be of inadequate caliber throughout.  So it was felt that his only bypass option would be a right common femoral artery endarterectomy with likely extension into the profunda and then a bypass from the newly patched common femoral artery down to the above-knee popliteal artery.  I discussed with this patient preoperatively that with the extensive amount of ulceration he had in the lower leg that there was no guarantee that these wounds would heal even with additional blood flow and in fact it may not be possible to salvage this leg but without an attempt at bypass, there would not be any chance of salvaging this right  lower leg and would need a below-knee  amputation.  He is aware of the risks of this procedure include bleeding, infection, death, myocardial infarction, stroke, possible infection of the prosthetic graft, and possible need for additional procedures.  He is aware of the risks and agreed to proceed forward with such.  DESCRIPTION OF THE OPERATION:  After full informed written consent was obtained from the patient, he was brought back to the operating room and placed supine upon the operating table.  Prior to induction, he had received IV antibiotics.  He was then prepped and draped in the standard fashion for right leg bypass procedure. I turned my attention first to his right groin.  A longitudinal incision was made over the common femoral artery, and I dissected down to the artery with blunt dissection and electrocautery, tying off any crossing veins in the process.  We were able to identify the common femoral artery from the inguinal ligament level down to its femoral bifurcation where we dissected out circumflex branches and then controlled these with vessel loops.  I was able to dissect down to the first bifurcation of the profunda artery and placed vessel  loops around these branches, also had control of the external iliac artery and also the superficial femoral artery.  At this point, I turned my attention to the above-knee exposure, I made an incision in the distal thigh, over Hunter canal.  I dissected down to the artery, retracting the sartorius out of the way,  I was able to get down to the above-knee popliteal artery.  It was actually noted to be soft here.  I dissected the veins off, tying off any crossing branches.  I had controlled this segment of the artery proximally and distally with vessel loops.  At this point, I turned my attention back to the groin.  I gave the patient's 6000 units of heparin intravenously to obtain therapeutic anticoagulation.  After waiting 3  minutes, I clamped the external iliac artery and placed the circumflex branches and the profunda branches under tension.  I made an arteriotomy with a 11-blade and extended it with Potts scissor.  Note, there was no bleeding whatsoever from the superficial femoral artery.  The clot in the common femoral artery appeared to have relatively fresh thrombus with some organization.  I extended the arteriotomy up into the more proximal common femoral artery.  Using a Cytogeneticist, I was able to eventually dissect the plaque which was heavily calcified and adherent to the wall circumferentially. I carried this dissection into a portion of the plaque which was densely adherent to the wall and the more proximal common femoral artery.  I did not feel the extension of this dissection into the external iliac artery was needed as this plaque was extensively adherent to the posterior wall and there was a greater than 80% of the lumen still present even with this plaque present.  I then carried out this dissection distally down to the superficial femoral artery.  There was absolutely no flow through the SFA.  I then carried this dissection into the profunda artery where it feathered out well.  Note that the plaque had signs of recent clots and also organized clot on it along with calcified plaque in the posterior aspect.  We then spent about 10 minutes removing intimal debris.  Note that the artery was extremely thinned out in the midportion of the common femoral artery, but still of adequate substance for use as the inflow target and did not need replacement with interposition graft.  I irrigated out this artery. There was no more loose intimal flaps.  I allowed all the arteries to back bleed, there was a good backbleeding from the profunda branches. No backbleeding from the superficial femoral artery.  The circumflex arteries did bleed and there was pulsatile bleeding from the external iliac  artery.  I reclamped everything and I washed out this artery.  Then, I took a bovine pericardial patch that I previously been soaking and fashioned it to the geometry of this arteriotomy.  Note, I had extended the arteriotomy into the profunda to help ensure that I had completed the endarterectomy successfully into the profunda.  There was no more residual intimal flaps extending into the first order profunda branches.  I then took this bovine patch and sewed it from above and below extending the patch angioplasty onto the profunda, thus performing a profundoplasty.  Also this was sewn in place with a stitch of 5-0 Prolene from above and a 6-0 Prolene from beneath tying in the middle.  Prior to completing the patch angioplasty, I allowed all vessels to back bleed or antegrade bleed. There was  good bleeding throughout.  I injected heparinized saline at this point, and I completed the patch angioplasty in the usual fashion.  At this point, I released all clamps and immediately there was a good pulse in this patched common femoral artery extending down into the profunda. Using a continuous Doppler, I interrogated the profunda branches.  There was good pulsatile triphasic flow into the profunda.  At this point, I obtained a Gore tunneler and tunneled from the medial incision in a subsartorial fashion up into the groin and then through this tunnel, placed a 6-mm Propatene graft.  This had been stretched to a full length and then I removed the tunnel leaving the graft in place.  The proximal end of this graft was spatulated for about a 8-mm long anastomosis.  I then made an incision in the bovine patch near the femoral bifurcation and extended it proximally with a Potts scissor to meet the geometry of the proximal graft.  The graft was sewn to the patched artery with a running stitch of 5-0 Prolene in an end to side configuration.   I completed this anastomosis without any difficulties and then I  clamped the graft distally in the thigh incision.  At this point, I released all clamps and then allowed the graft to pressurize.  There was good pulsatile bleeding in the graft.  At this point, I then clamped the graft near its arterial anastomosis and placed thrombin and Gelfoam around the anastomoses to treat the needle holes.  Around this time, approximately an hour had passed, so I gave another 2000 units of heparin to continue the anticoagulation.  I then adjusted the tension on this graft to the anterior incision and then I cut the graft to appropriate length, spatulating in the process for another about 7-mm arteriotomy.  I then clamped the above-the-knee popliteal artery, proximally and distally, and removed the vessel loops.  I made an arteriotomy in the popliteal artery with a 11-blade  and extended it with Potts scissors.  I allowed the artery to bleed in an  antegrade and retrograde fashion.  There was backbleeding from both ends.  I then  sewed the graft to the artery in end-to-side fashion using a running stitch of 6-0 Prolene.  Prior to completing this anastomosis, I allowed all arteries to back bleed and the graft to bleed in an antegrade fashion.  There was good pulsatile bleeding in the graft.  I have flushed out the anastomosis with heparin and I completed the anastomosis in the usual fashion.  Then I released all clamps and immediately there was resumption of pulsatile flow in the graft and also in the above-knee popliteal artery.  Using a Doppler, I was able to obtain multiphasic flow in the above-knee popliteal artery. With the Doppler, I checked the foot.   There was at least a biphasic posterior tibial and a strongly monophasic anterior tibial.  At this point, I irrigated out all wounds and placed on some more thrombin and Gelfoam in all wounds.  After waiting a few minutes, we looked for any bleeding points which were controlled with electrocautery, irrigated out  the wound another time, and placed some more thrombin Gelfoam.  After waiting an additional 3 minutes, there was no more active bleeding for any wounds.  We then closed the groin with a double layer of 2-0 Vicryl and then a layer of 3-0 Vicryl and the skin was closed with running subcuticular 4-0 Monocryl and then closure was reinforced with Dermabond  and then Coverall dressings. Sterile dressing was applied for the thigh incision.  A single layer of 3-0 Vicryl was placed and then a layer of 4-0 Monocryl was placed in a subcuticular fashion to close the skin.  The skin was then reinforced with Dermabond and then after allowing to dry, Coverall sterile dressing was applied.  The ulcerations in the right foot were just wrapped tightly with a Kerlix taking care to leave the foot open for Doppler examination.  The patient was allowed to awaken without any difficulties with the plan to readmit to the step-down unit for observation overnight.  COMPLICATIONS:  None.  CONDITION:  Stable.     Fransisco Hertz, MD     BLC/MEDQ  D:  06/20/2010  T:  06/21/2010  Job:  [id number]  Electronically Signed by Leonides Sake MD on 07/01/2010 10:16:59 AM

## 2010-07-02 NOTE — Discharge Summary (Signed)
NAME:  Justin Gilmore, Justin Gilmore                 ACCOUNT NO.:  [id number]  MEDICAL RECORD NO.:  [id number]           PATIENT TYPE:  I  LOCATION:  A318                          FACILITY:  [facility]  PHYSICIAN:  Kawon Willcutt L. Lendell Caprice, MDDATE OF BIRTH:  October 28, 1945  DATE OF ADMISSION:  06/11/2010 DATE OF DISCHARGE:  03/30/2012LH                         DISCHARGE SUMMARY-REFERRING   DISCHARGE DIAGNOSES: 1. Right leg deep venous thrombosis. 2. Peripheral arterial disease of the right leg with several ischemic     ulcers. 3. Tobacco abuse. 4. Malignant hypertension, new diagnosis. 5. Renal insufficiency, suspect chronic kidney disease. 6. Right leg cellulitis, improved.  MEDICATIONS:  At the time of transfer include; 1. Lovenox 80 mg subcutaneously b.i.d. 2. Vancomycin IV per pharmacy. 3. Metoprolol 50 mg p.o. b.i.d. 4. Nicotine patch 14 mg daily. 5. Warfarin has been stopped, but he has received 2 doses, currently     normal INR and some p.r.n. medications.  DIET:  Heart healthy.  ACTIVITY:  Ad lib, but keep his right leg elevated when seated or in bed.  CONDITION AT TRANSFER:  Stable.  PROCEDURES:  None.  LABORATORY DATA:  CBC on admission normal.  INR on admission was 0.91, PTT 37.  INR today is 1.03.  Complete metabolic panel on admission significant for a creatinine of 1.57, creatinine today is 1.78 after several doses of Lasix and hydrochlorothiazide, which have been stopped. Magnesium normal.  Fasting lipid panel and basic metabolic panel for tomorrow had been ordered.  RADIOLOGY AND SPECIAL STUDIES:  Ultrasound of the right leg showed partially occlusive mural thrombus through the superficial femoral vein, occlusive thrombus in the distal superficial femoral vein, partially occlusive mural thrombus in the popliteal vein and posterior tibial veins.  Visualized portions of the greater saphenous vein are unremarkable.  Chest x-ray showed hyperaeration vague nodular opacity at the right  lung base.  Recommend followup to assess stability.  Coronary artery calcifications; arterial Doppler of the right leg shows irregular monophasic waveform recorded distally, right ABI is 0.24, left leg shows a triphasic waveform distally, left ABI is 0.84.  HISTORY AND HOSPITAL COURSE:  Please see H and P for complete admission details.  Justin Gilmore is a 65 year old black male smoker without a primary care physician who has been having problems with his right leg for quite some time.  He presented to initially an [facility] with right leg swelling and pain.  He also had ulcerations in several areas that started over a month ago.  He was referred to Dr. Hilda Lias who apparently eventually referred him to the emergency room.  Dr. Hilda Lias had scheduled an appointment as an outpatient with Vascular Surgery, but the patient did not make the appointment.  The patient has no known medical history, but has not had a primary care physician for quite some time. He smokes 2 packs of cigarettes a day and was not previously on any medications.  He had a blood pressure in the emergency room of 172/94. He was afebrile.  He had right leg swelling with erythema, warmth.  No ulceration was mentioned in the H and P, but when I  evaluated him the following day, he did in fact have several ulcers.  One is on the right lateral malleolus with a black eschar and no drainage measuring about 3 x 4 cm with an area proximal to that about 4.5 x 2 cm and the third area in the pretibial region measuring 4.5 x 5 cm.  The patient had a tight, shiny skin with significant erythema and warmth.  His toes were hairless and I could not palpate a pulse.  A Doppler was done in the emergency room, which showed DVT.  I ordered arterial studies which confirmed arterial disease as well.  Upon further questioning, the patient reports claudication for the past year or so, but never sought medical care.  He initially was placed on  Lasix and subsequently hydrochlorothiazide, which I have stopped and placed him on metoprolol.  He was on Lovenox on admission as well as a few doses of Coumadin, but his INR has not increased as of yet.  Upon finding the arterial study results, I did discuss the case with Dr. Hart Rochester .  Because of his DVT and renal insufficiency, his case is fairly complicated in terms of workup and optimal treatment.  Also, he reports that the location of the ulcer may be problematic.  He recommends at this time transferred to the hospitalist service at [facility] and he will consult to determine best options for any further workup or treatment.  The patient's cellulitis, edema are much improved and his ulcers remain the same.  Yesterday, he has had about 1-2+ pitting edema with significant erythema involving the mid to distal leg and foot.  Today, he has trace edema if any and minimal erythema.  He has significant desquamative flaking.  I have started him on IV fluids in anticipation for possible angiography or other workup.  He will need fasting lipids.  I will also order an EKG as he has evidence of coronary artery calcifications on chest x-ray.  He has had no cardiac symptoms in the past or present.  He does wish to quit smoking and currently has a nicotine patch in place.  Total time today is 90 minutes.  The patient understands the plan and is agreeable.     Justin Gilmore L. Lendell Caprice, MD     CLS/MEDQ  D:  06/13/2010  T:  06/13/2010  Job:  [id number]  cc:   Teola Bradley, M.D. Fax: [phone]  Dr. Hart Rochester  Electronically Signed by Crista Curb MD on 07/02/2010 09:34:39 PM

## 2010-07-11 ENCOUNTER — Ambulatory Visit (INDEPENDENT_AMBULATORY_CARE_PROVIDER_SITE_OTHER): Payer: Self-pay | Admitting: Vascular Surgery

## 2010-07-11 DIAGNOSIS — I824Z9 Acute embolism and thrombosis of unspecified deep veins of unspecified distal lower extremity: Secondary | ICD-10-CM

## 2010-07-11 NOTE — Assessment & Plan Note (Signed)
OFFICE VISIT  Justin Gilmore, Justin Gilmore DOB:  February 13, 1946                                       07/11/2010 ZOXWR#:[id number]  This is Gilmore postoperative visit.  HISTORY OF PRESENT ILLNESS:  This patient underwent Gilmore right common femoral artery endarterectomy with extended profundoplasty with Gilmore right common femoral artery to above knee pop bypass with Propaten on 06/20/2010 who presents for followup.  No fever or chills.  He does note some drainage from his above the knee pop exposure.  It is serous.  No pus.  He has been undergoing wound care with some type of clinic at [facility] and they have been debriding his wounds serially with good result.  PHYSICAL EXAMINATION:  He had Gilmore blood pressure 161/81, Gilmore heart rate of 54, respirations were 12.  Focused exam:  The right groin incisions are mostly healed.  There is firm tissue here consistent with postoperative scar tissue.  No frankly ballotable hematoma here.  The above the knee popliteal exposure feels slightly fluctuant.  There is some serous fluid leaking there.  We also took down the dressings in his lower leg.  He has at least three big ulcerations.  The anterior one has recently been debrided and actually has good granulation tissue in it.  The medial one remains about the same with some evidence of some granulation tissue. The posterior ulceration still has some eschar on top of it and this is the next ulcer that will be debrided at this wound care clinic.  In his lower foot he has Gilmore weakly palpable dorsalis pedis and also Gilmore posterior tibial.  There are no other ischemic changes in this foot.  The foot feels warm.  MEDICAL DECISION MAKING:  This is Gilmore 65 year old gentleman status post extended right femoral artery endarterectomy and profundoplasty and then Gilmore bypass from the femoral artery to above knee popliteal artery with Propaten.  Given the presence of likely seroma in the popliteal exposure I would go ahead  and proceed with antibiotics as Gilmore precaution.  He is going to continue wound care and we will see him back in about 3 weeks to check on the wounds.  Meanwhile, he is going to continue to follow up with his wound care specialist for continued debridement on these ulcers.  I am hopeful that he will be able to heal this though as I noted previously there is no guarantee that he is going to be able to heal the full extent of the ulcers that he already had present prior to the bypass procedure.  The next followup in addition we will obtain some ABIs and also Gilmore duplex on the graft to verify patency of the graft.    Fransisco Hertz, MD Electronically Signed  BLC/MEDQ  D:  07/11/2010  T:  07/11/2010  Job:  [id number]

## 2010-07-16 DIAGNOSIS — I824Z9 Acute embolism and thrombosis of unspecified deep veins of unspecified distal lower extremity: Secondary | ICD-10-CM

## 2010-07-21 ENCOUNTER — Encounter: Payer: Self-pay | Admitting: Vascular Surgery

## 2010-07-28 NOTE — Op Note (Signed)
  NAME:  Justin Gilmore, Justin Gilmore                 ACCOUNT NO.:  [id number]  MEDICAL RECORD NO.:  [id number]           PATIENT TYPE:  I  LOCATION:  5508                         FACILITY:  [facility]  PHYSICIAN:  Juleen China IV, MDDATE OF BIRTH:  22-Mar-1945  DATE OF PROCEDURE:  06/17/2010 DATE OF DISCHARGE:                              OPERATIVE REPORT   PREOPERATIVE DIAGNOSIS:  Right leg ulcer.  POSTOPERATIVE DIAGNOSIS:  Right leg ulcer.  PROCEDURE PERFORMED: 1. Ultrasound access left femoral artery. 2. Abdominal aortogram. 3. Right leg runoff. 4. Second order catheterization.  INDICATIONS:  This is a 65 year old gentleman with right foot ulcer.  He has subacute DVT on the right and renal insufficiency comes today for arteriogram and possible intervention.  PROCEDURE:  The patient was identified in the holding area and taken to room 8, placed supine on the table.  Both groins were prepped and draped in usual fashion.  Time-out was called.  The left femoral artery was evaluated with ultrasound and found to be widely patent.  It was accessed under ultrasound guidance with an 18-gauge needle.  An 0.035 wire was then advanced into the iliac system where I met resistance and a 5-French sheath was placed.  A Kumpe catheter was used to navigate the wire into the aorta, and an Omni flush catheter was advanced to the level of L1 and abdominal aortogram was obtained.  Next, the catheters were pulled down the aortic bifurcation and distal aortogram was performed.  I then crossed the aortic bifurcation with an Omni flush catheter and Bentson wire.  Catheter was placed in the right external iliac artery and right leg runoff was performed.  FINDINGS:  Aortogram:  Visualized portions of the suprarenal abdominal aorta showed no significant disease.  There was no evidence of renal artery stenosis.  The infrarenal abdominal aorta is widely patent.  It is heavily calcified.  There is approximately 60%  stenosis within the left common iliac artery.  The left external iliac artery is widely patent.  The left hypogastric artery is not visualized.  Right common iliac artery is widely patent.  Right lower extremity:  The right common femoral artery is occluded. The proximal right profunda femoral artery is occluded.  The right superficial femoral artery is occluded with reconstitution in the mid thigh.  The popliteal artery is patent throughout its course.  There is three-vessel runoff to the foot.  After the above images were obtained, decision was made to terminate the procedure.  Catheters and wires were removed.  The patient taken to the holding area for sheath pull.  IMPRESSION: 1. Left common iliac stenosis. 2. Right common femoral artery occlusion with concomitant proximal     right profunda occlusion and right superficial femoral artery     occlusion with reconstitution of the mid thigh. 3. Three-vessel runoff of the right leg.     Juleen China IV, MD     VWB/MEDQ  D:  06/17/2010  T:  06/18/2010  Job:  [id number] Electronically Signed by Arelia Longest IV MD on 07/28/2010 10:53:28 PM

## 2010-08-08 ENCOUNTER — Encounter (INDEPENDENT_AMBULATORY_CARE_PROVIDER_SITE_OTHER): Payer: Medicaid Other

## 2010-08-08 ENCOUNTER — Ambulatory Visit (INDEPENDENT_AMBULATORY_CARE_PROVIDER_SITE_OTHER): Payer: Self-pay | Admitting: Vascular Surgery

## 2010-08-08 DIAGNOSIS — L98499 Non-pressure chronic ulcer of skin of other sites with unspecified severity: Secondary | ICD-10-CM

## 2010-08-08 DIAGNOSIS — I739 Peripheral vascular disease, unspecified: Secondary | ICD-10-CM

## 2010-08-08 DIAGNOSIS — Z48812 Encounter for surgical aftercare following surgery on the circulatory system: Secondary | ICD-10-CM

## 2010-08-12 NOTE — Assessment & Plan Note (Signed)
OFFICE VISIT  MCCLELLAN, DEMARAIS A DOB:  10-30-1945                                       08/08/2010 ZOXWR#:[id number]  Post-operative note  HISTORY OF PRESENT ILLNESS:  This is a 65 year old gentleman who is now status post right common femoral artery to above-knee popliteal bypass with Propaten graft for limb salvage.  He had multiple ulcers on his right foot that I had concerns were not going to be able to heal.  He notes at this point now most of his ulcers on the foot are healed except for one on his dorsum of his foot which is in the process of healing. Notes some pain.  No further drainage from his popliteal exposure site. Denies any fevers or chills at this point.  PHYSICAL EXAMINATION:  On physical exam examination today, he had a blood pressure of 144/76, heart rate of 48, temperature was 98.0.  On focused exam, the right above the knee popliteal exposure is completely healed at this point.  His previous ulcers are all healed except for an ulcer on his dorsum of his right foot.  This has good granulation in it and is almost of the level of the skin, so this should reepithelialized quickly soon.  He has a weakly palpable DP and PT on this side.  Warm foot.  He had noninvasive studies of the bilateral lower extremity.  ABI on the right side 1.05,  left is 1.06.  The right posterior tibials look to be hyperemic and likely multiphasic, however, due to hyperemia appears be above the baseline.  I suspect this patient has multiphasic flow but with the elevated waveforms are above baseline due to hyperemia, so I cannot absolutely say such.  He also had a graft duplex that demonstrates it is a widely patent endarterectomized common femoral segment.  The inflow into this is at 170 cm/sec, so the inflow itself is elevated velocity at 259 v/dat the proximal stenosis and distally 316 cm/sec.  This does not meet the criteria for x3.0 even though it is greater  than 300 c/s.  MEDICAL DECISION MAKING:  This is a 65 year old gentleman who is recovering now slightly past about 6 weeks after his right common femoral artery endarterectomy with extended profundoplasty and bypass from that down to above-knee popliteal segment.  He demonstrates some fluid around the graft on the ultrasound.  He is not clinically presenting with any evidence of graft infection.  I suspect this is residual serous fluid from the surgery.  He is healing up his ulcers well and I suspect the one residual ulcer should be fully reepithelialized within a couple weeks.  He is going to continue Santyl and wet-to-dry dressings to his right foot.  Otherwise will see this gentleman in 2 weeks.  He will follow up with [place] for arterial duplex studies and I will continue to follow this graft closely.    Fransisco Hertz, MD Electronically Signed  BLC/MEDQ  D:  08/08/2010  T:  08/12/2010  Job:  [id number]

## 2010-08-15 NOTE — Procedures (Unsigned)
BYPASS GRAFT EVALUATION  INDICATION:  Right CFA to AK popliteal graft performed 06/20/2010.  HISTORY: Diabetes:  No. Cardiac:  No. Hypertension:  Yes. Smoking:  Yes. Previous Surgery:  Fem-pop graft.  SINGLE LEVEL ARTERIAL EXAM                              RIGHT              LEFT Brachial:                    126                139 Anterior tibial:             146                148 Posterior tibial:            138                160 Peroneal: Ankle/brachial index:        1.05               1.15  PREVIOUS ABI:  Date:  NA  RIGHT:  LEFT:  LOWER EXTREMITY BYPASS GRAFT DUPLEX EXAM:  DUPLEX:  Waveforms on the right ABI are abnormal likely due to postsurgical hyperemia.  The bilateral ABI is within normal limits. Widely patent right fem-pop graft with elevated velocities of the anastomoses, however, no hyperplasia or restenosis is observed. There is serous fluid surrounding the entire graft.  IMPRESSION:  Widely patent right femoral-popliteal graft as described above.   ___________________________________________ Fransisco Hertz, MD  LT/MEDQ  D:  08/08/2010  T:  08/08/2010  Job:  [id number]

## 2010-09-09 ENCOUNTER — Other Ambulatory Visit (HOSPITAL_COMMUNITY): Payer: Self-pay | Admitting: Oncology

## 2010-09-09 ENCOUNTER — Encounter (HOSPITAL_COMMUNITY): Payer: Medicaid Other | Attending: Oncology

## 2010-09-09 DIAGNOSIS — Z86718 Personal history of other venous thrombosis and embolism: Secondary | ICD-10-CM | POA: Insufficient documentation

## 2010-09-09 DIAGNOSIS — D509 Iron deficiency anemia, unspecified: Secondary | ICD-10-CM | POA: Insufficient documentation

## 2010-09-09 DIAGNOSIS — I82409 Acute embolism and thrombosis of unspecified deep veins of unspecified lower extremity: Secondary | ICD-10-CM

## 2010-09-09 LAB — PROTIME-INR
INR: 2.13 — ABNORMAL HIGH (ref 0.00–1.49)
Prothrombin Time: 24.2 seconds — ABNORMAL HIGH (ref 11.6–15.2)

## 2010-09-11 ENCOUNTER — Other Ambulatory Visit (HOSPITAL_COMMUNITY): Payer: Self-pay | Admitting: Oncology

## 2010-09-11 ENCOUNTER — Encounter (HOSPITAL_COMMUNITY): Payer: Medicaid Other

## 2010-09-11 DIAGNOSIS — I82509 Chronic embolism and thrombosis of unspecified deep veins of unspecified lower extremity: Secondary | ICD-10-CM

## 2010-09-11 LAB — CBC
HCT: 34.4 % — ABNORMAL LOW (ref 39.0–52.0)
Hemoglobin: 11 g/dL — ABNORMAL LOW (ref 13.0–17.0)
RBC: 3.89 MIL/uL — ABNORMAL LOW (ref 4.22–5.81)

## 2010-09-11 LAB — DIFFERENTIAL
Basophils Absolute: 0.1 10*3/uL (ref 0.0–0.1)
Eosinophils Absolute: 0.3 10*3/uL (ref 0.0–0.7)
Lymphs Abs: 1.6 10*3/uL (ref 0.7–4.0)
Monocytes Absolute: 0.5 10*3/uL (ref 0.1–1.0)
Neutro Abs: 2.6 10*3/uL (ref 1.7–7.7)

## 2010-09-11 LAB — PROTIME-INR
INR: 2.63 — ABNORMAL HIGH (ref 0.00–1.49)
Prothrombin Time: 28.5 seconds — ABNORMAL HIGH (ref 11.6–15.2)

## 2010-09-11 LAB — RETICULOCYTES
RBC.: 3.89 MIL/uL — ABNORMAL LOW (ref 4.22–5.81)
Retic Ct Pct: 1.9 % (ref 0.4–3.1)

## 2010-09-11 LAB — D-DIMER, QUANTITATIVE: D-Dimer, Quant: 0.54 ug/mL-FEU — ABNORMAL HIGH (ref 0.00–0.48)

## 2010-09-12 LAB — FERRITIN: Ferritin: 20 ng/mL — ABNORMAL LOW (ref 22–322)

## 2010-09-12 LAB — VITAMIN B12: Vitamin B-12: 228 pg/mL (ref 211–911)

## 2010-09-12 LAB — FOLATE: Folate: 2.4 ng/mL — ABNORMAL LOW

## 2010-09-19 ENCOUNTER — Encounter (HOSPITAL_COMMUNITY): Payer: Medicaid Other | Attending: Oncology

## 2010-09-19 ENCOUNTER — Other Ambulatory Visit (HOSPITAL_COMMUNITY): Payer: Self-pay | Admitting: Oncology

## 2010-09-19 ENCOUNTER — Encounter (HOSPITAL_COMMUNITY): Payer: Medicaid Other

## 2010-09-19 DIAGNOSIS — Z86718 Personal history of other venous thrombosis and embolism: Secondary | ICD-10-CM | POA: Insufficient documentation

## 2010-09-19 DIAGNOSIS — I82409 Acute embolism and thrombosis of unspecified deep veins of unspecified lower extremity: Secondary | ICD-10-CM

## 2010-09-19 DIAGNOSIS — D509 Iron deficiency anemia, unspecified: Secondary | ICD-10-CM | POA: Insufficient documentation

## 2010-09-19 LAB — PROTIME-INR: INR: 2.3 — ABNORMAL HIGH (ref 0.00–1.49)

## 2010-09-25 ENCOUNTER — Other Ambulatory Visit (HOSPITAL_COMMUNITY): Payer: Self-pay | Admitting: Oncology

## 2010-09-25 ENCOUNTER — Encounter (HOSPITAL_BASED_OUTPATIENT_CLINIC_OR_DEPARTMENT_OTHER): Payer: Medicaid Other

## 2010-09-25 DIAGNOSIS — I82409 Acute embolism and thrombosis of unspecified deep veins of unspecified lower extremity: Secondary | ICD-10-CM

## 2010-09-25 LAB — PROTIME-INR
INR: 2.72 — ABNORMAL HIGH (ref 0.00–1.49)
Prothrombin Time: 29.3 seconds — ABNORMAL HIGH (ref 11.6–15.2)

## 2010-09-26 ENCOUNTER — Other Ambulatory Visit (HOSPITAL_COMMUNITY): Payer: Self-pay | Admitting: *Deleted

## 2010-09-26 DIAGNOSIS — I82409 Acute embolism and thrombosis of unspecified deep veins of unspecified lower extremity: Secondary | ICD-10-CM

## 2010-10-02 ENCOUNTER — Encounter (HOSPITAL_BASED_OUTPATIENT_CLINIC_OR_DEPARTMENT_OTHER): Payer: Medicaid Other

## 2010-10-02 ENCOUNTER — Other Ambulatory Visit (HOSPITAL_COMMUNITY): Payer: Self-pay | Admitting: Oncology

## 2010-10-02 DIAGNOSIS — I82409 Acute embolism and thrombosis of unspecified deep veins of unspecified lower extremity: Secondary | ICD-10-CM

## 2010-10-02 LAB — PROTIME-INR
INR: 3.03 — ABNORMAL HIGH (ref 0.00–1.49)
Prothrombin Time: 31.9 seconds — ABNORMAL HIGH (ref 11.6–15.2)

## 2010-10-02 NOTE — Progress Notes (Signed)
Labs drawn today for pt.  Pt on 5mg  and 7.5 mg alt.  Call pt at [phone]

## 2010-10-02 NOTE — Progress Notes (Signed)
Same dose. PT/INR in one week.  Pt notified to continue same coumadin dose and that we will recheck his pt/inr with his previously scheduled lab appt on Friday 7/27. He verbalized understanding.

## 2010-10-10 ENCOUNTER — Telehealth (HOSPITAL_COMMUNITY): Payer: Self-pay | Admitting: *Deleted

## 2010-10-10 ENCOUNTER — Encounter (HOSPITAL_COMMUNITY): Payer: Medicaid Other

## 2010-10-10 ENCOUNTER — Other Ambulatory Visit (HOSPITAL_COMMUNITY): Payer: Self-pay | Admitting: Oncology

## 2010-10-10 DIAGNOSIS — I82409 Acute embolism and thrombosis of unspecified deep veins of unspecified lower extremity: Secondary | ICD-10-CM

## 2010-10-10 LAB — PROTIME-INR: Prothrombin Time: 29.2 seconds — ABNORMAL HIGH (ref 11.6–15.2)

## 2010-10-10 LAB — CBC
MCV: 91.1 fL (ref 78.0–100.0)
RDW: 21.2 % — ABNORMAL HIGH (ref 11.5–15.5)

## 2010-10-10 LAB — FERRITIN: Ferritin: 156 ng/mL (ref 22–322)

## 2010-10-10 NOTE — Telephone Encounter (Signed)
Message copied by Dennie Maizes on Fri Oct 10, 2010  3:06 PM ------      Message from: Ellouise Newer III      Created: Fri Oct 10, 2010  2:05 PM       Same Coumadin dose.            PT/INR in 2 weeks.

## 2010-10-10 NOTE — Progress Notes (Signed)
Labs drawn today for pt, cbc,ferr.  Patient on 7.5mg  of coud.  Call patient at [phone]

## 2010-10-10 NOTE — Telephone Encounter (Signed)
Message copied by Dennie Maizes on Fri Oct 10, 2010  3:07 PM ------      Message from: Ellouise Newer III      Created: Fri Oct 10, 2010  2:05 PM       Same Coumadin dose.            PT/INR in 2 weeks.

## 2010-10-10 NOTE — Telephone Encounter (Signed)
Spoke with pt, instruct same dose coumadin,rtc in 2 weeks for labs. Kathie Rhodes wrote appt down.

## 2010-10-24 ENCOUNTER — Encounter (HOSPITAL_COMMUNITY): Payer: Medicaid Other | Attending: Oncology

## 2010-10-24 ENCOUNTER — Other Ambulatory Visit (HOSPITAL_COMMUNITY): Payer: Self-pay | Admitting: Oncology

## 2010-10-24 DIAGNOSIS — I82409 Acute embolism and thrombosis of unspecified deep veins of unspecified lower extremity: Secondary | ICD-10-CM

## 2010-10-24 DIAGNOSIS — N289 Disorder of kidney and ureter, unspecified: Secondary | ICD-10-CM | POA: Insufficient documentation

## 2010-10-24 DIAGNOSIS — D509 Iron deficiency anemia, unspecified: Secondary | ICD-10-CM | POA: Insufficient documentation

## 2010-10-24 LAB — PROTIME-INR
INR: 2.94 — ABNORMAL HIGH (ref 0.00–1.49)
Prothrombin Time: 31.1 seconds — ABNORMAL HIGH (ref 11.6–15.2)

## 2010-10-24 NOTE — Progress Notes (Signed)
Labs drawn today for pt.  Patient on 7.5mg  coud.  Call patient at [phone]

## 2010-10-31 ENCOUNTER — Encounter (HOSPITAL_COMMUNITY): Payer: Medicaid Other

## 2010-10-31 ENCOUNTER — Telehealth (HOSPITAL_COMMUNITY): Payer: Self-pay | Admitting: *Deleted

## 2010-10-31 DIAGNOSIS — I82409 Acute embolism and thrombosis of unspecified deep veins of unspecified lower extremity: Secondary | ICD-10-CM

## 2010-10-31 LAB — PROTIME-INR
INR: 4.7 — ABNORMAL HIGH (ref 0.00–1.49)
Prothrombin Time: 44.9 seconds — ABNORMAL HIGH (ref 11.6–15.2)

## 2010-10-31 NOTE — Progress Notes (Signed)
Labs drawn today for pt,  Patient on 5mg  and 7.5 mg alt.  Call patient at [phone]

## 2010-10-31 NOTE — Telephone Encounter (Signed)
Pt. Instructed to take NO coumadin this weekend. Start back on coumadin at 5 mg on Monday night. And pt level Tuesday.

## 2010-11-04 ENCOUNTER — Telehealth (HOSPITAL_COMMUNITY): Payer: Self-pay | Admitting: *Deleted

## 2010-11-04 ENCOUNTER — Encounter (HOSPITAL_COMMUNITY): Payer: Medicaid Other

## 2010-11-04 DIAGNOSIS — I82409 Acute embolism and thrombosis of unspecified deep veins of unspecified lower extremity: Secondary | ICD-10-CM

## 2010-11-04 NOTE — Telephone Encounter (Signed)
Message copied by Dennie Maizes on Tue Nov 04, 2010  5:07 PM ------      Message from: Mariel Sleet, ERIC S      Created: Tue Nov 04, 2010  4:58 PM       Same dose INR Monday.

## 2010-11-04 NOTE — Progress Notes (Signed)
Labs drawn today for pt.  Patient on 5mg  coud qd.  Call patient at [phone]

## 2010-11-05 ENCOUNTER — Telehealth (HOSPITAL_COMMUNITY): Payer: Self-pay | Admitting: *Deleted

## 2010-11-05 NOTE — Telephone Encounter (Signed)
Spoke with pt. Instruct to continue same dose coumadin. RTC Monday 8/27 at 920 am for PT/INR. Pt understands,

## 2010-11-07 ENCOUNTER — Encounter (INDEPENDENT_AMBULATORY_CARE_PROVIDER_SITE_OTHER): Payer: Medicaid Other

## 2010-11-07 ENCOUNTER — Telehealth (HOSPITAL_COMMUNITY): Payer: Self-pay

## 2010-11-07 DIAGNOSIS — I739 Peripheral vascular disease, unspecified: Secondary | ICD-10-CM

## 2010-11-07 DIAGNOSIS — Z48812 Encounter for surgical aftercare following surgery on the circulatory system: Secondary | ICD-10-CM

## 2010-11-07 NOTE — Telephone Encounter (Signed)
OK per Dr. Mariel Sleet to have PT level as scheduled.

## 2010-11-10 ENCOUNTER — Encounter (HOSPITAL_COMMUNITY): Payer: Medicaid Other

## 2010-11-10 ENCOUNTER — Telehealth (HOSPITAL_COMMUNITY): Payer: Self-pay | Admitting: *Deleted

## 2010-11-10 ENCOUNTER — Other Ambulatory Visit (HOSPITAL_COMMUNITY): Payer: Self-pay | Admitting: *Deleted

## 2010-11-10 DIAGNOSIS — I82409 Acute embolism and thrombosis of unspecified deep veins of unspecified lower extremity: Secondary | ICD-10-CM

## 2010-11-10 NOTE — Progress Notes (Signed)
Labs drawn today for pt.  Patient on 5mg  coud.  Call patient at [phone]

## 2010-11-10 NOTE — Telephone Encounter (Signed)
Message left on answering machine as below. 

## 2010-11-10 NOTE — Telephone Encounter (Signed)
Message copied by Dennie Maizes on Mon Nov 10, 2010  4:23 PM ------      Message from: Mariel Sleet, ERIC S      Created: Mon Nov 10, 2010  3:54 PM       Inr in 7 days. Same dose.

## 2010-11-11 ENCOUNTER — Encounter (HOSPITAL_COMMUNITY): Payer: Self-pay | Admitting: Oncology

## 2010-11-11 ENCOUNTER — Encounter (HOSPITAL_BASED_OUTPATIENT_CLINIC_OR_DEPARTMENT_OTHER): Payer: Medicaid Other | Admitting: Oncology

## 2010-11-11 DIAGNOSIS — N289 Disorder of kidney and ureter, unspecified: Secondary | ICD-10-CM | POA: Insufficient documentation

## 2010-11-11 DIAGNOSIS — I771 Stricture of artery: Secondary | ICD-10-CM

## 2010-11-11 DIAGNOSIS — K219 Gastro-esophageal reflux disease without esophagitis: Secondary | ICD-10-CM

## 2010-11-11 DIAGNOSIS — I82409 Acute embolism and thrombosis of unspecified deep veins of unspecified lower extremity: Secondary | ICD-10-CM | POA: Insufficient documentation

## 2010-11-11 DIAGNOSIS — D509 Iron deficiency anemia, unspecified: Secondary | ICD-10-CM

## 2010-11-11 DIAGNOSIS — I10 Essential (primary) hypertension: Secondary | ICD-10-CM

## 2010-11-11 HISTORY — DX: Stricture of artery: I77.1

## 2010-11-11 HISTORY — DX: Gastro-esophageal reflux disease without esophagitis: K21.9

## 2010-11-11 HISTORY — DX: Disorder of kidney and ureter, unspecified: N28.9

## 2010-11-11 HISTORY — DX: Essential (primary) hypertension: I10

## 2010-11-11 NOTE — Patient Instructions (Signed)
[facility]  Discharge Instructions  RECOMMENDATIONS MADE BY THE CONSULTANT AND ANY TEST RESULTS WILL BE SENT TO YOUR REFERRING DOCTOR.         SPECIAL INSTRUCTIONS/FOLLOW-UP:  We will schedule a CT Scan in the next 1-2 weeks. We will do additional labs next week when you are here for your PT level. You will return to see the MD in 2 months.   I acknowledge that I have been informed and understand all the instructions given to me and received a copy. I do not have any more questions at this time, but understand that I may call the [facility]ty [facility] at [facility] at [phone] during business hours should I have any further questions or need assistance in obtaining follow-up care.    __________________________________________  _____________  __________ Signature of Patient or Authorized Representative            Date                   Time    __________________________________________ Nurse's Signature

## 2010-11-11 NOTE — Progress Notes (Signed)
Luan Moore, PA, PA [address] [place] [address]  1. DVT (deep venous thrombosis)  Antithrombin III, Protein C activity, Protein C, total, Protein S activity, Protein S, total, Lupus anticoagulant panel, Beta-2-glycoprotein i abs, IgG/M/A, Homocysteine, serum, Factor 5 leiden, Prothrombin gene mutation, Cardiolipin antibodies, IgG, IgM, IgA, Comprehensive metabolic panel, CT Chest Wo Contrast, Ambulatory referral to Gastroenterology  2. Arterial insufficiency    3. Renal insufficiency  Comprehensive metabolic panel  4. Hypertension    5. GERD (gastroesophageal reflux disease)    6. Iron deficiency anemia  CBC, Differential, Comprehensive metabolic panel, Iron and TIBC, Ferritin    CURRENT THERAPY: Coumadin 5mg /7.5mg  alternating.  S/P Feraheme 1020 mg on 09/19/10.  INTERVAL HISTORY: ALASDAIR KLEVE 65 y.o. male returns for  regular  visit for followup of DVT of right LE without any initiating cause and iron deficiency anemia.    The patient denies any complaints.  His right LE remains swollen with woody infiltration of the right calf.  He reports that this has improved significantly since diagnosis.  He denies any LE pain or discomfort.    He denies any rectal bleeding, blood in stool, black tarry stool, hematuria, hemoptysis, gingival bleeding, and spontaneous bleeding.  He denies any breathing changes or difficulties.  He denies any cough or shortness of breath.  The patient denies having a CT of chest or follow-up chest X-ray recently.  He underwent a chest X-ray when he was in the hospital which showed: 1. Hyperaeration. No active lung disease.  2. Vague nodular opacity at the right lung base may be due to overlapping bony and vascular structures. Recommend follow-up chest x-ray to assess stability.  3. Coronary artery calcifications.  The patient denies ever having a colonoscopy.    I spent a significant amount of time with the patient going over the significance of  having an unexplained blood clot.  He is at high risk for lung cancer due to his 60 plus pack year smoking history.  This warrants a low dose spiral CT to evaluate for occult malignancy.  He also needs a screening/diagnostic colonoscopy for his iron deficiency anemia.  I offered the patient a rectal exam for hemoccult stool cards but he declined.    Past Medical History  Diagnosis Date  . Hypertension   . Tobacco abuse   . DVT (deep venous thrombosis) 2012    Right lower ext.  Marland Kitchen GERD (gastroesophageal reflux disease)   . Diabetes mellitus   . Renal cyst   . Renal failure     Stage 2- Stage 3  . DVT (deep venous thrombosis) 11/11/2010  . Arterial insufficiency 11/11/2010  . Renal insufficiency 11/11/2010  . Hypertension 11/11/2010  . GERD (gastroesophageal reflux disease) 11/11/2010    has DVT (deep venous thrombosis); Arterial insufficiency; Renal insufficiency; Hypertension; and GERD (gastroesophageal reflux disease) on his problem list.      has no known allergies.  Mr. Matich does not currently have medications on file.  Past Surgical History  Procedure Date  . 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 Leonides Sake    Denies any headaches, dizziness, double vision, fevers, chills, night sweats, nausea, vomiting, diarrhea, constipation, chest pain, heart palpitations, shortness of breath, blood in stool, black tarry stool, urinary pain, urinary burning, urinary frequency, hematuria.   PHYSICAL EXAMINATION   Filed Vitals:   11/11/10 0930  BP: 133/73  Pulse: 51  Temp: 98.5 F (36.9 C)  GENERAL:alert, no distress, well developed, comfortable, cooperative and smiling SKIN: skin color, texture, turgor are normal, no rashes or significant lesions HEAD: Normocephalic, No masses, lesions, tenderness or abnormalities EYES: normal EARS: External ears normal OROPHARYNX:no exudate, no erythema, lips, buccal mucosa, and tongue  normal, edentulous and no masses or lesions noted.  NECK: supple, no bruits, no JVD, thyroid normal size, non-tender, without nodularity, trachea midline, small benign left submandibular node noted that is soft, non-tender, not fixed, and mobile LYMPH:  no palpable lymphadenopathy except for above mentioned. BREAST:not examined LUNGS: clear to auscultation and percussion, decreased breath sounds throughout. HEART: regular rate & rhythm, no murmurs, no gallops, S1 normal and S2 normal ABDOMEN:abdomen soft, non-tender, normal bowel sounds and no rebound or guarding BACK: Back symmetric, no curvature., No CVA tenderness EXTREMITIES:less then 2 second capillary refill, no joint deformities, effusion, or inflammation, no clubbing, no cyanosis, positive findings:  edema of right LE with woody infiltration and B/L LE hyperpigmentation  NEURO: alert & oriented x 3 with fluent speech, no focal motor/sensory deficits, gait normal    LABORATORY DATA: CBC    Component Value Date/Time   WBC 4.4 10/10/2010 1000   RBC 3.60* 10/10/2010 1000   HGB 10.7* 10/10/2010 1000   HCT 32.8* 10/10/2010 1000   PLT 264 10/10/2010 1000   MCV 91.1 10/10/2010 1000   MCH 29.7 10/10/2010 1000   MCHC 32.6 10/10/2010 1000   RDW 21.2* 10/10/2010 1000   LYMPHSABS PENDING 09/11/2010 1446   LYMPHSABS 1.6 09/11/2010 1446   MONOABS PENDING 09/11/2010 1446   MONOABS 0.5 09/11/2010 1446   EOSABS PENDING 09/11/2010 1446   EOSABS 0.3 09/11/2010 1446   BASOSABS PENDING 09/11/2010 1446   BASOSABS 0.1 09/11/2010 1446    Lab Results  Component Value Date   INR 2.76* 11/10/2010   INR 2.41* 11/04/2010   INR 4.70* 10/31/2010   Lab Results  Component Value Date   IRON 39* 09/11/2010   TIBC 413 09/11/2010   FERRITIN 156 10/10/2010    RADIOGRAPHIC STUDIES:  06/11/10  *RADIOLOGY REPORT*  Clinical Data: Pain, elevated blood pressure  CHEST - 2 VIEW  Comparison: Portable chest x-ray of 03/24/2006  Findings: The lungs are slightly  hyperaerated. A vague nodular  opacity at the right lung base overlying the anterior right seventh  rib probably is due to overlapping bony and vascular structures.  Attention to this area on follow-up chest x-ray is recommended.  Mediastinal contours appear normal. The heart is within upper  limits of normal. There do appear to be coronary artery  calcifications present. No bony abnormality is seen.  IMPRESSION:  1. Hyperaeration. No active lung disease.  2. Vague nodular opacity at the right lung base may be due to  overlapping bony and vascular structures. Recommend follow-up  chest x-ray to assess stability.  3. Coronary artery calcifications.  Original Report Authenticated By: Juline Patch, M.D.    ASSESSMENT:  1. Right LE DVT, on Coumadin anticoagulation 2. Iron deficiency anemia, IV iron administered on 09/19/10 3. Tobacco abuse, 60 pack years 4. Renal insufficiency 5. Arterial and Venous insufficiency   PLAN:  1. Will continue Coumadin anticoagulation for 1 year (instead of the proposed 6 months) 2. Compression stockings, thigh-high, intermediate grade (20-30 mm Hg) 3. Refer to GI for screening/diagnostic colonoscopy. 4. Patient education regarding colonoscopy, the procedure, the prep, and the reason for its importance in preventative medicine, polyp removal, investigation into iron deficiency anemia, and as a screening tool for colorectal cancers. 5.  CT of chest without contrast to evaluate for occult malignancy. 6. Patient education regarding the risks, benefits, and alternatives to a CT of chest.  The patient understands the importance of having one to evaluate for occult malignancy.  The patient has an unexplained blood clot and he is at high risk due to his significant smoking history. 7. Lab work on his next PT/INR on Tuesday 11/18/10: Hypercoagulable panel, PT/INR, CBC diff, CMET, Ferritin, Iron, TIBC. 8. Return in 2 months for follow-up.   All questions were answered.  The patient knows to call the clinic with any problems, questions or concerns. We can certainly see the patient much sooner if necessary.  The patient and plan discussed with Glenford Peers, MD and he is in agreement with the aforementioned.  I spent 40 minutes counseling the patient face to face. The total time spent in the appointment was 60 minutes.  More than 50% of the time spent with the patient was utilized for counseling.   Joli Koob

## 2010-11-14 NOTE — Procedures (Unsigned)
BYPASS GRAFT EVALUATION  INDICATION:  Follow up peripheral vascular disease.  HISTORY: Diabetes:  No. Cardiac:  No. Hypertension:  Yes. Smoking:  Currently. Previous Surgery:  Right femoral-popliteal bypass graft on 06/20/2010.  SINGLE LEVEL ARTERIAL EXAM                              RIGHT              LEFT Brachial: Anterior tibial: Posterior tibial: Peroneal: Ankle/brachial index:        0.96               0.92  PREVIOUS ABI:  Date: 08/08/2010  RIGHT:  1.05  LEFT:  1.15  LOWER EXTREMITY BYPASS GRAFT DUPLEX EXAM:  DUPLEX:  Elevated velocities present involving the right femoral to popliteal bypass graft proximal anastomosis, which may be suggestive of stenosis; however, there is a significant diameter change present. Serous fluid present surrounding the graft on the right lower extremity.  IMPRESSION:  Patent right femoral-to-popliteal above-knee bypass graft. Right ankle brachial index of 0.96.  Left ankle brachial index of 0.92. The left ankle brachial index is in the mild claudication range. These have bilaterally decreased since the previous study on 08/08/2010.         ___________________________________________ Fransisco Hertz, MD  SH/MEDQ  D:  11/10/2010  T:  11/10/2010  Job:  [id number]

## 2010-11-18 ENCOUNTER — Other Ambulatory Visit (HOSPITAL_COMMUNITY): Payer: Medicaid Other

## 2010-11-18 ENCOUNTER — Ambulatory Visit (HOSPITAL_COMMUNITY): Payer: Medicaid Other

## 2010-11-24 ENCOUNTER — Telehealth (HOSPITAL_COMMUNITY): Payer: Self-pay

## 2010-11-24 ENCOUNTER — Encounter (HOSPITAL_COMMUNITY): Payer: Medicaid Other | Attending: Oncology

## 2010-11-24 ENCOUNTER — Ambulatory Visit (HOSPITAL_COMMUNITY)
Admission: RE | Admit: 2010-11-24 | Discharge: 2010-11-24 | Disposition: A | Discharge status: Home / Self Care | Payer: Medicaid Other | Source: Ambulatory Visit | Attending: Oncology | Admitting: Oncology

## 2010-11-24 ENCOUNTER — Other Ambulatory Visit (HOSPITAL_COMMUNITY): Payer: Self-pay | Admitting: Oncology

## 2010-11-24 DIAGNOSIS — D509 Iron deficiency anemia, unspecified: Secondary | ICD-10-CM | POA: Insufficient documentation

## 2010-11-24 DIAGNOSIS — N289 Disorder of kidney and ureter, unspecified: Secondary | ICD-10-CM | POA: Insufficient documentation

## 2010-11-24 DIAGNOSIS — I2510 Atherosclerotic heart disease of native coronary artery without angina pectoris: Secondary | ICD-10-CM | POA: Insufficient documentation

## 2010-11-24 DIAGNOSIS — I82409 Acute embolism and thrombosis of unspecified deep veins of unspecified lower extremity: Secondary | ICD-10-CM

## 2010-11-24 DIAGNOSIS — F17200 Nicotine dependence, unspecified, uncomplicated: Secondary | ICD-10-CM | POA: Insufficient documentation

## 2010-11-24 DIAGNOSIS — R937 Abnormal findings on diagnostic imaging of other parts of musculoskeletal system: Secondary | ICD-10-CM | POA: Insufficient documentation

## 2010-11-24 DIAGNOSIS — E538 Deficiency of other specified B group vitamins: Secondary | ICD-10-CM | POA: Insufficient documentation

## 2010-11-24 DIAGNOSIS — F1010 Alcohol abuse, uncomplicated: Secondary | ICD-10-CM | POA: Insufficient documentation

## 2010-11-24 DIAGNOSIS — E7210 Disorders of sulfur-bearing amino-acid metabolism, unspecified: Secondary | ICD-10-CM | POA: Insufficient documentation

## 2010-11-24 LAB — DIFFERENTIAL
Eosinophils Absolute: 0.2 10*3/uL (ref 0.0–0.7)
Eosinophils Relative: 3 % (ref 0–5)
Lymphs Abs: 1.5 10*3/uL (ref 0.7–4.0)
Monocytes Absolute: 0.5 10*3/uL (ref 0.1–1.0)
Monocytes Relative: 8 % (ref 3–12)

## 2010-11-24 LAB — COMPREHENSIVE METABOLIC PANEL
ALT: 12 U/L (ref 0–53)
Albumin: 3.5 g/dL (ref 3.5–5.2)
Alkaline Phosphatase: 73 U/L (ref 39–117)
GFR calc Af Amer: 33 mL/min — ABNORMAL LOW (ref >60)
Glucose, Bld: 120 mg/dL — ABNORMAL HIGH (ref 70–99)
Potassium: 4.5 mEq/L (ref 3.5–5.1)
Sodium: 136 mEq/L (ref 135–145)
Total Protein: 6.3 g/dL (ref 6.0–8.3)

## 2010-11-24 LAB — CBC
HCT: 29.6 % — ABNORMAL LOW (ref 39.0–52.0)
MCH: 31.4 pg (ref 26.0–34.0)
MCV: 94.9 fL (ref 78.0–100.0)
Platelets: 289 10*3/uL (ref 150–400)
RDW: 20.6 % — ABNORMAL HIGH (ref 11.5–15.5)

## 2010-11-24 LAB — IRON AND TIBC
Saturation Ratios: 14 % — ABNORMAL LOW (ref 20–55)
TIBC: 328 ug/dL (ref 215–435)
UIBC: 281 ug/dL (ref 125–400)

## 2010-11-24 LAB — PROTIME-INR: Prothrombin Time: 31.3 seconds — ABNORMAL HIGH (ref 11.6–15.2)

## 2010-11-24 NOTE — Telephone Encounter (Signed)
Spoke with sister - to her knowledge there has been no bleeding of any kind.  She will let him know to continue coumadin at the same dosage and RTC on 9/14 for PT/INR. I will call patient in am to discuss nephrology consult and any bleeding issues.

## 2010-11-24 NOTE — Progress Notes (Signed)
Labs drawn today for pt.  Patient on 5mg  coud.  Call patient at [phone].   Labs also drawn, cbc/diff,at3,protein c,s,feibc,acardo,ferr,homocystine,lupas ,factor 5,cmp

## 2010-11-25 ENCOUNTER — Other Ambulatory Visit (HOSPITAL_COMMUNITY): Payer: Self-pay | Admitting: Oncology

## 2010-11-25 DIAGNOSIS — R9389 Abnormal findings on diagnostic imaging of other specified body structures: Secondary | ICD-10-CM

## 2010-11-25 DIAGNOSIS — R911 Solitary pulmonary nodule: Secondary | ICD-10-CM

## 2010-11-25 LAB — LUPUS ANTICOAGULANT PANEL: PTT Lupus Anticoagulant: 63.1 secs — ABNORMAL HIGH (ref 30.0–45.6)

## 2010-11-25 LAB — ANTITHROMBIN III: AntiThromb III Func: 110 % (ref 75–120)

## 2010-11-25 LAB — PROTEIN S ACTIVITY: Protein S Activity: 19 % — ABNORMAL LOW (ref 69–129)

## 2010-11-26 LAB — PROTHROMBIN GENE MUTATION

## 2010-11-26 LAB — BETA-2-GLYCOPROTEIN I ABS, IGG/M/A
Beta-2 Glyco I IgG: 0 G Units (ref <20)
Beta-2-Glycoprotein I IgA: 13 A Units (ref <20)

## 2010-11-26 LAB — PROTEIN C, TOTAL: Protein C, Total: 36 % — ABNORMAL LOW (ref 72–160)

## 2010-11-26 LAB — CARDIOLIPIN ANTIBODIES, IGG, IGM, IGA: Anticardiolipin IgG: 13 GPL U/mL (ref <23)

## 2010-11-28 ENCOUNTER — Telehealth (HOSPITAL_COMMUNITY): Payer: Self-pay

## 2010-11-28 ENCOUNTER — Encounter (HOSPITAL_BASED_OUTPATIENT_CLINIC_OR_DEPARTMENT_OTHER): Payer: Medicaid Other

## 2010-11-28 ENCOUNTER — Other Ambulatory Visit (HOSPITAL_COMMUNITY): Payer: Self-pay | Admitting: Oncology

## 2010-11-28 ENCOUNTER — Other Ambulatory Visit (HOSPITAL_COMMUNITY): Payer: Medicaid Other

## 2010-11-28 DIAGNOSIS — I82409 Acute embolism and thrombosis of unspecified deep veins of unspecified lower extremity: Secondary | ICD-10-CM

## 2010-11-28 LAB — PROTIME-INR: Prothrombin Time: 34.8 seconds — ABNORMAL HIGH (ref 11.6–15.2)

## 2010-11-28 NOTE — Telephone Encounter (Signed)
Notes Recorded by Dellis Anes, PA on 11/28/2010 at 12:29 PM Hold coumadin tonight. Restart on Saturday with 4 mg. hange Coumadin to 4/5/5 repeating. Call in 2 mg tablets of coumadin if needed  PT/INR on Monday  1257 Spoke with Kathie Rhodes and instructions given.  Will call in RX to [facility] for 2 mg tablets. Terisa Starr RN

## 2010-11-28 NOTE — Progress Notes (Signed)
Labs drawn today for pt.  Patient on 5mg .  Call patient at [phone]

## 2010-12-01 ENCOUNTER — Encounter (HOSPITAL_COMMUNITY): Payer: Medicaid Other

## 2010-12-01 ENCOUNTER — Telehealth (HOSPITAL_COMMUNITY): Payer: Self-pay | Admitting: *Deleted

## 2010-12-01 ENCOUNTER — Other Ambulatory Visit (HOSPITAL_COMMUNITY): Payer: Self-pay | Admitting: Oncology

## 2010-12-01 DIAGNOSIS — I82409 Acute embolism and thrombosis of unspecified deep veins of unspecified lower extremity: Secondary | ICD-10-CM

## 2010-12-01 NOTE — Telephone Encounter (Signed)
Message copied by Dennie Maizes on Mon Dec 01, 2010  1:34 PM ------      Message from: Ellouise Newer III      Created: Mon Dec 01, 2010  1:24 PM       Same dose            PT/INR in one week

## 2010-12-01 NOTE — Progress Notes (Signed)
Labs drawn today for pt.  Patient took 2mg  on Sat and 5mg  on Sun.  Call patient at [phone]

## 2010-12-01 NOTE — Telephone Encounter (Signed)
Spoke with [employer]. States pt took 2 mg Sat eve and 5mg  Sun eve. Per T.Kefalas instruct pt to take 4mg  tonight then 5mg  tomorrow night continue to alternate every other day then recheck PT/INR on Friday morning. Betty repeated instructions with understanding.

## 2010-12-02 ENCOUNTER — Ambulatory Visit (HOSPITAL_COMMUNITY): Admission: RE | Admit: 2010-12-02 | Payer: Medicaid Other | Source: Ambulatory Visit

## 2010-12-05 ENCOUNTER — Other Ambulatory Visit (HOSPITAL_COMMUNITY): Payer: Self-pay | Admitting: Oncology

## 2010-12-05 ENCOUNTER — Encounter (HOSPITAL_COMMUNITY): Payer: Medicaid Other

## 2010-12-05 ENCOUNTER — Ambulatory Visit (HOSPITAL_COMMUNITY)
Admission: RE | Admit: 2010-12-05 | Discharge: 2010-12-05 | Disposition: A | Discharge status: Home / Self Care | Payer: Medicaid Other | Source: Ambulatory Visit | Attending: Oncology | Admitting: Oncology

## 2010-12-05 DIAGNOSIS — R937 Abnormal findings on diagnostic imaging of other parts of musculoskeletal system: Secondary | ICD-10-CM | POA: Insufficient documentation

## 2010-12-05 DIAGNOSIS — I82409 Acute embolism and thrombosis of unspecified deep veins of unspecified lower extremity: Secondary | ICD-10-CM

## 2010-12-05 DIAGNOSIS — M549 Dorsalgia, unspecified: Secondary | ICD-10-CM | POA: Insufficient documentation

## 2010-12-05 DIAGNOSIS — R9389 Abnormal findings on diagnostic imaging of other specified body structures: Secondary | ICD-10-CM

## 2010-12-05 LAB — FERRITIN: Ferritin: 37 ng/mL (ref 22–322)

## 2010-12-05 LAB — CBC
Hemoglobin: 9.9 g/dL — ABNORMAL LOW (ref 13.0–17.0)
MCH: 30.2 pg (ref 26.0–34.0)
MCV: 94.8 fL (ref 78.0–100.0)
RBC: 3.28 MIL/uL — ABNORMAL LOW (ref 4.22–5.81)

## 2010-12-05 LAB — RETICULOCYTES
RBC.: 3.28 MIL/uL — ABNORMAL LOW (ref 4.22–5.81)
Retic Count, Absolute: 121.4 10*3/uL (ref 19.0–186.0)
Retic Ct Pct: 3.7 % — ABNORMAL HIGH (ref 0.4–3.1)

## 2010-12-05 LAB — IRON AND TIBC: UIBC: 357 ug/dL (ref 125–400)

## 2010-12-05 LAB — FOLATE: Folate: 2.4 ng/mL — ABNORMAL LOW

## 2010-12-05 NOTE — Progress Notes (Signed)
Labs drawn today for pt, anemia panel,cbc,. Patient on 5mg  coud.  Call patient at [phone]

## 2010-12-10 ENCOUNTER — Encounter (HOSPITAL_COMMUNITY): Payer: Self-pay | Admitting: Oncology

## 2010-12-10 ENCOUNTER — Telehealth (HOSPITAL_COMMUNITY): Payer: Self-pay

## 2010-12-10 ENCOUNTER — Encounter (HOSPITAL_BASED_OUTPATIENT_CLINIC_OR_DEPARTMENT_OTHER): Payer: Medicaid Other | Admitting: Oncology

## 2010-12-10 ENCOUNTER — Other Ambulatory Visit (HOSPITAL_COMMUNITY): Payer: Self-pay | Admitting: Oncology

## 2010-12-10 ENCOUNTER — Encounter (HOSPITAL_COMMUNITY): Payer: Medicaid Other

## 2010-12-10 DIAGNOSIS — I82409 Acute embolism and thrombosis of unspecified deep veins of unspecified lower extremity: Secondary | ICD-10-CM

## 2010-12-10 DIAGNOSIS — E538 Deficiency of other specified B group vitamins: Secondary | ICD-10-CM

## 2010-12-10 DIAGNOSIS — N289 Disorder of kidney and ureter, unspecified: Secondary | ICD-10-CM

## 2010-12-10 DIAGNOSIS — F1010 Alcohol abuse, uncomplicated: Secondary | ICD-10-CM

## 2010-12-10 DIAGNOSIS — E7211 Homocystinuria: Secondary | ICD-10-CM

## 2010-12-10 HISTORY — DX: Homocystinuria: E72.11

## 2010-12-10 HISTORY — DX: Alcohol abuse, uncomplicated: F10.10

## 2010-12-10 HISTORY — DX: Deficiency of other specified B group vitamins: E53.8

## 2010-12-10 LAB — PROTIME-INR: Prothrombin Time: 30.3 seconds — ABNORMAL HIGH (ref 11.6–15.2)

## 2010-12-10 MED ORDER — FOLIC ACID 1 MG PO TABS: 1.0000 mg | ORAL_TABLET | Freq: Two times a day (BID) | ORAL | Status: AC

## 2010-12-10 NOTE — Telephone Encounter (Signed)
Message left with family member for patient to continue the same dosage of coumadin and to RTC 10/3 for PT/INR.

## 2010-12-10 NOTE — Progress Notes (Signed)
Labs drawn today for pt.  Patient on 5mg  coud.  Call patient at [phone]

## 2010-12-10 NOTE — Progress Notes (Signed)
[place], [place] [address] [address]  1. DVT (deep venous thrombosis)   2. Hyperhomocysteinemia   3. ETOH abuse   4. Folic acid deficiency     CURRENT THERAPY: On Coumadin anticoagulation  INTERVAL HISTORY: Justin Gilmore 65 y.o. male returns for  regular  visit for followup of DVT of LE.  The patient was asked to come in today to see a provider after having his PT/INR performed because he is folic acid deficient with homocystinemia and bony abnormalities seen on CT of chest and MRI of thoracic spine.  This makes [place] believe that he is utilizing alcohol in excess.  On discussion with the patient, he admits to 2-3 beers daily and up to 5 hard alcoholic beverages in addition to the beer daily.    Family members present with the patient affirm that he is drinking alcohol.  Went over alcohol cessation programs including alcoholics anonymous  Past Medical History  Diagnosis Date  . Hypertension   . Tobacco abuse   . DVT (deep venous thrombosis) 2012    Right lower ext.  Marland Kitchen GERD (gastroesophageal reflux disease)   . Diabetes mellitus   . Renal cyst   . Renal failure     Stage 2- Stage 3  . DVT (deep venous thrombosis) 11/11/2010  . Arterial insufficiency 11/11/2010  . Renal insufficiency 11/11/2010  . Hypertension 11/11/2010  . GERD (gastroesophageal reflux disease) 11/11/2010  . 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 ETOH abuse 12/10/2010  . Folic acid deficiency 12/10/2010    has DVT (deep venous thrombosis); Arterial insufficiency; Renal insufficiency; Hypertension; GERD (gastroesophageal reflux disease); Hyperhomocysteinemia; ETOH abuse; and Folic acid deficiency on his problem list.      has no known allergies.  Justin Gilmore does not currently have medications on file.  Past Surgical History  Procedure Date  . 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 Leonides Sake    Denies any headaches, dizziness, double  vision, fevers, chills, night sweats, nausea, vomiting, diarrhea, constipation, chest pain, heart palpitations, shortness of breath, blood in stool, black tarry stool, urinary pain, urinary burning, urinary frequency, hematuria.   PHYSICAL EXAMINATION  ECOG PERFORMANCE STATUS: 1 - Symptomatic but completely ambulatory  Filed Vitals:   12/10/10 1134  BP: 124/63  Pulse: 50  Temp: 98.7 F (37.1 C)    GENERAL:alert, no distress, well nourished, well developed, comfortable and cooperative SKIN: skin color, texture, turgor are normal HEAD: Normocephalic EYES: normal EARS: External ears normal OROPHARYNX:edentulous and mucous membranes are moist  NECK: trachea midline LYMPH:  not examined BREAST:not examined LUNGS: not examined HEART: not examined ABDOMEN:not examined BACK: Back symmetric, no curvature. EXTREMITIES:  NEURO: alert & oriented x 3 with fluent speech, no focal motor/sensory deficits, gait normal  LABORATORY DATA: Lab Results  Component Value Date   INR 2.84* 12/10/2010   INR 2.03* 12/05/2010   INR 2.26* 12/01/2010     RADIOGRAPHIC STUDIES:  12/05/10  *RADIOLOGY REPORT*  Clinical Data: Metastatic disease to the spine. Chronic back pain.  No known history of cancer.  MRI THORACIC SPINE WITHOUT CONTRAST  Technique: Multiplanar and multiecho pulse sequences of the  thoracic spine were obtained without intravenous contrast.  Comparison: CT 11/24/2010.  Findings: The marrow signal is diffusely abnormal, with replacement  of fatty marrow diffusely. Small island of normal fatty marrow  view remain present although this is a nine nor the. Although  nonspecific, the appearance can be associated with  multiple  myeloma, lymphoma, or other myelodysplastic disease including  myelofibrosis. Renal osteodystrophy is also a consideration in a  patient with chronic renal failure, although uncommonly seen in  patients that are not on dialysis.  This involves all visualized  bones of the axial and appendicular  skeleton. There is superimposed degenerative disease with trace  grade 1 anterolisthesis of T1 and T2 T3.  The thoracic cord demonstrates normal caliber. No intramedullary  lesions are identified. Chronic appearing T6 inferior endplate  compression fracture is present. No acute compression fractures  are identified. Renal scarring and atrophy is incidentally noted  in the paraspinal soft tissues. Focal areas of hyperintensity are  present in the lungs, probably representing small nodules and  pulmonary vessels seen on recent CT.  There are no focal disc protrusions or central stenosis identified.  IMPRESSION:  1. Diffusely abnormal marrow signal in all visualized bones.  There is replacement majority of normal fatty marrow expected in  this age group. Although nonspecific, the differential  considerations include marrow replacing etiologies such as  myelodysplastic syndrome, leukemia/lymphoma, multiple myeloma,  myelofibrosis with renal osteodystrophy considered less likely.  2. Chronic T6 compression fracture. Mild lumbar spondylosis  without focal stenosis.  Original Report Authenticated By: Andreas Newport, M.D.  11/25/10  *RADIOLOGY REPORT*  Clinical Data: DVT of uncertain etiology, high risk factors for  lung cancer, strong smoking history  CT CHEST WITHOUT CONTRAST  Technique: Multidetector CT imaging of the chest was performed  following the standard protocol without IV contrast. Sagittal and  coronal MPR images reconstructed from axial data set.  Comparison: None  Findings:  Extensive atherosclerotic calcifications aorta and coronary  arteries.  2.0 x 1.6 cm diameter low attenuation left adrenal lesion image 60  likely adenoma.  Numerous probable hepatic cysts, largest 2.0 x 1.8 cm image 53.  Mildly lobulated upper pole right kidney.  Remaining visualized portions upper abdomen unremarkable.  No thoracic adenopathy.  Mild  emphysematous changes at primarily the upper lobes.  Questionable tiny 2 mm nodule right upper lobe image 30.  Subpleural density at lateral right lung base image 46, question  atelectasis or scarring.  A few additional tiny areas of questionable nodularity noted in  both lungs.  No dominant pulmonary mass or nodule identified.  Inhomogeneous osseous attenuation throughout the spine.  IMPRESSION:  Extensive atherosclerotic calcifications at aorta and coronary  arteries.  Probable left adrenal adenoma and hepatic cysts.  Question new area of atelectasis versus scarring at lateral right  lung base though nodule not entirely excluded; recommend assessment  on followup imaging in 46 months.  Additional tiny questionable nodular foci in the lungs, can be  reassessed at time of follow-up CT as above.  Abnormal osseous attenuation of the spine, nonspecific, could be  related to osteoporosis but cannot exclude other etiologies  including multiple myeloma, marrow infiltrative processes, or  metastatic disease.  Recommend follow-up MR imaging of the thoracic spine to evaluate.  Original Report Authenticated By: Lollie Marrow, M.D.         ASSESSMENT:  1. EtOH abuse 2. DVT LE, on anticoagulation  3. Hyperhomocystinemia    PLAN:  1. Folic acid 1 mg BID 2. Lab work prior to next follow-up appointment: CBC, diff, Anemia panel 3. Homocystine level in 3 months 4. Return as scheduled 5. I personally reviewed and went over laboratory results with the patient. 6. I personally reviewed and went over radiographic studies with the patient. 7. More than 50% of the time  spent with the patient was utilized for counseling. 8. Follow-up CT scan of Chest without contrast scheduled for 03/22/10    All questions were answered. The patient knows to call the clinic with any problems, questions or concerns. We can certainly see the patient much sooner if necessary.  The patient and plan discussed with  Glenford Peers, MD and he is in agreement with the aforementioned.   KEFALAS,THOMAS

## 2010-12-17 ENCOUNTER — Other Ambulatory Visit (HOSPITAL_COMMUNITY): Payer: Self-pay | Admitting: Oncology

## 2010-12-17 ENCOUNTER — Encounter (HOSPITAL_COMMUNITY): Payer: Medicaid Other | Attending: Oncology

## 2010-12-17 DIAGNOSIS — I82409 Acute embolism and thrombosis of unspecified deep veins of unspecified lower extremity: Secondary | ICD-10-CM

## 2010-12-17 LAB — PROTIME-INR
INR: 1.8 — ABNORMAL HIGH (ref 0.00–1.49)
Prothrombin Time: 21.2 seconds — ABNORMAL HIGH (ref 11.6–15.2)

## 2010-12-17 NOTE — Progress Notes (Signed)
Labs drawn today for pt.  Patient on 5mg  of coud.  Call patient [phone]

## 2010-12-18 ENCOUNTER — Ambulatory Visit (HOSPITAL_COMMUNITY): Payer: Self-pay | Admitting: *Deleted

## 2010-12-18 ENCOUNTER — Telehealth (HOSPITAL_COMMUNITY): Payer: Self-pay | Admitting: *Deleted

## 2010-12-18 NOTE — Telephone Encounter (Signed)
Pt. Notified to continue same dose coumadin. PT level due 12/24/10

## 2010-12-24 ENCOUNTER — Encounter (HOSPITAL_BASED_OUTPATIENT_CLINIC_OR_DEPARTMENT_OTHER): Payer: Medicaid Other

## 2010-12-24 ENCOUNTER — Other Ambulatory Visit (HOSPITAL_COMMUNITY): Payer: Self-pay | Admitting: Nephrology

## 2010-12-24 DIAGNOSIS — N189 Chronic kidney disease, unspecified: Secondary | ICD-10-CM

## 2010-12-24 DIAGNOSIS — I82409 Acute embolism and thrombosis of unspecified deep veins of unspecified lower extremity: Secondary | ICD-10-CM

## 2010-12-24 LAB — PROTIME-INR
INR: 2.43 — ABNORMAL HIGH (ref 0.00–1.49)
Prothrombin Time: 26.8 seconds — ABNORMAL HIGH (ref 11.6–15.2)

## 2010-12-24 NOTE — Progress Notes (Signed)
Labs drawn today for pt.  Patient on 5mg  qd.  Call patient at [phone]

## 2010-12-26 ENCOUNTER — Telehealth (HOSPITAL_COMMUNITY): Payer: Self-pay

## 2010-12-26 ENCOUNTER — Other Ambulatory Visit (HOSPITAL_COMMUNITY): Payer: Self-pay

## 2010-12-26 DIAGNOSIS — I82409 Acute embolism and thrombosis of unspecified deep veins of unspecified lower extremity: Secondary | ICD-10-CM

## 2010-12-26 NOTE — Telephone Encounter (Signed)
Notes Recorded by Randall An, MD on 12/24/2010 at 5:56 PM Change coumadin to 5/4/4 and INR in 7 days. Notes Recorded by Evelena Leyden, RN on 12/24/2010 at 4:24 PM 9/17 Coumadin 4 mg /5mg  9/26 Coumadin 5 mg 10/3 Coumadin 5 mg Notes Recorded by Randall An, MD on 12/24/2010 at 4:01 PM How much coumadin was he on when he was here in sept and oct /3?

## 2010-12-30 ENCOUNTER — Telehealth (HOSPITAL_COMMUNITY): Payer: Self-pay

## 2010-12-30 NOTE — Telephone Encounter (Signed)
Spoke with patient, verifies that he received message to take coumadin 5 mg/ 4 mg/ 4 mg/ 5 mg/ 4 mg/ 4 mg and to RTC on 10/18 for repeat PT/INR.

## 2010-12-31 ENCOUNTER — Ambulatory Visit (HOSPITAL_COMMUNITY)
Admission: RE | Admit: 2010-12-31 | Discharge: 2010-12-31 | Disposition: A | Discharge status: Home / Self Care | Payer: Medicaid Other | Source: Ambulatory Visit | Attending: Nephrology | Admitting: Nephrology

## 2010-12-31 DIAGNOSIS — N189 Chronic kidney disease, unspecified: Secondary | ICD-10-CM

## 2010-12-31 DIAGNOSIS — N289 Disorder of kidney and ureter, unspecified: Secondary | ICD-10-CM | POA: Insufficient documentation

## 2011-01-01 ENCOUNTER — Other Ambulatory Visit (HOSPITAL_COMMUNITY): Payer: Self-pay | Admitting: Oncology

## 2011-01-01 ENCOUNTER — Encounter (HOSPITAL_BASED_OUTPATIENT_CLINIC_OR_DEPARTMENT_OTHER): Payer: Medicaid Other

## 2011-01-01 DIAGNOSIS — I82409 Acute embolism and thrombosis of unspecified deep veins of unspecified lower extremity: Secondary | ICD-10-CM

## 2011-01-01 NOTE — Progress Notes (Signed)
Labs drawn today for pt. Patient on 5, 4, 4 mg alternating.  Call patient at [phone]

## 2011-01-01 NOTE — Progress Notes (Signed)
Message left on patient's answering machine to return 10/25 to have inr done with all other labs that are due for him. He was instructed to continue same dose of coumadin.

## 2011-01-08 ENCOUNTER — Encounter (HOSPITAL_COMMUNITY): Payer: Medicaid Other

## 2011-01-08 ENCOUNTER — Other Ambulatory Visit (HOSPITAL_COMMUNITY): Payer: Self-pay | Admitting: Oncology

## 2011-01-08 ENCOUNTER — Telehealth (HOSPITAL_COMMUNITY): Payer: Self-pay | Admitting: *Deleted

## 2011-01-08 DIAGNOSIS — F1010 Alcohol abuse, uncomplicated: Secondary | ICD-10-CM

## 2011-01-08 DIAGNOSIS — I82409 Acute embolism and thrombosis of unspecified deep veins of unspecified lower extremity: Secondary | ICD-10-CM

## 2011-01-08 DIAGNOSIS — E7211 Homocystinuria: Secondary | ICD-10-CM

## 2011-01-08 DIAGNOSIS — E538 Deficiency of other specified B group vitamins: Secondary | ICD-10-CM

## 2011-01-08 LAB — PROTIME-INR: INR: 1.54 — ABNORMAL HIGH (ref 0.00–1.49)

## 2011-01-08 NOTE — Progress Notes (Signed)
Labs drawn today for pt. Patient on 5, 4, 4 mg alternating.  Call patient at [phone] 

## 2011-01-08 NOTE — Telephone Encounter (Signed)
Spoke with pt states he has not missed any doses and he is not taking any Jaquila Santelli medicines. Instruct to continue same dose Coumadin. RTC on 10/30 for MD appointment and 11/1 for lab work. Verbalizes understanding.

## 2011-01-08 NOTE — Telephone Encounter (Signed)
Message copied by Dennie Maizes on Thu Jan 08, 2011  3:08 PM ------      Message from: Ellouise Newer III      Created: Thu Jan 08, 2011 12:28 PM       Any missed doses?  Demiah Gullickson medications?              Same dose            PT/INR one week

## 2011-01-08 NOTE — Progress Notes (Signed)
Patient called to come back for more labs not drawn on 10/25.

## 2011-01-09 ENCOUNTER — Encounter (HOSPITAL_COMMUNITY): Payer: Medicaid Other

## 2011-01-09 LAB — COMPREHENSIVE METABOLIC PANEL
AST: 17 U/L (ref 0–37)
Albumin: 3.2 g/dL — ABNORMAL LOW (ref 3.5–5.2)
CO2: 22 mEq/L (ref 19–32)
Calcium: 9.6 mg/dL (ref 8.4–10.5)
Glucose, Bld: 114 mg/dL — ABNORMAL HIGH (ref 70–99)
Potassium: 4.3 mEq/L (ref 3.5–5.1)
Sodium: 136 mEq/L (ref 135–145)
Total Bilirubin: 0.1 mg/dL — ABNORMAL LOW (ref 0.3–1.2)
Total Protein: 7 g/dL (ref 6.0–8.3)

## 2011-01-09 LAB — DIFFERENTIAL
Basophils Relative: 2 % — ABNORMAL HIGH (ref 0–1)
Lymphocytes Relative: 27 % (ref 12–46)
Lymphs Abs: 1.4 10*3/uL (ref 0.7–4.0)
Monocytes Absolute: 0.7 10*3/uL (ref 0.1–1.0)
Monocytes Relative: 13 % — ABNORMAL HIGH (ref 3–12)
Neutro Abs: 2.9 10*3/uL (ref 1.7–7.7)
Neutrophils Relative %: 54 % (ref 43–77)

## 2011-01-09 LAB — CBC
HCT: 34.3 % — ABNORMAL LOW (ref 39.0–52.0)
Hemoglobin: 10.7 g/dL — ABNORMAL LOW (ref 13.0–17.0)
MCH: 28.1 pg (ref 26.0–34.0)
RBC: 3.81 MIL/uL — ABNORMAL LOW (ref 4.22–5.81)

## 2011-01-09 NOTE — Progress Notes (Signed)
Labs drawn today for folate,homocysteine,cmp,cbc,diff, vb12  No ventipucture charge because labs were missed and patient had to come back

## 2011-01-10 LAB — HOMOCYSTEINE: Homocysteine: 22.5 umol/L — ABNORMAL HIGH (ref 4.0–15.4)

## 2011-01-12 ENCOUNTER — Encounter (HOSPITAL_COMMUNITY): Payer: Self-pay

## 2011-01-13 ENCOUNTER — Ambulatory Visit (HOSPITAL_COMMUNITY): Payer: Medicaid Other | Admitting: Oncology

## 2011-01-15 ENCOUNTER — Encounter (HOSPITAL_COMMUNITY): Payer: Medicaid Other | Attending: Oncology

## 2011-01-15 ENCOUNTER — Other Ambulatory Visit (HOSPITAL_COMMUNITY): Payer: Self-pay | Admitting: Oncology

## 2011-01-15 ENCOUNTER — Telehealth (HOSPITAL_COMMUNITY): Payer: Self-pay | Admitting: *Deleted

## 2011-01-15 DIAGNOSIS — N289 Disorder of kidney and ureter, unspecified: Secondary | ICD-10-CM

## 2011-01-15 DIAGNOSIS — I82409 Acute embolism and thrombosis of unspecified deep veins of unspecified lower extremity: Secondary | ICD-10-CM | POA: Insufficient documentation

## 2011-01-15 LAB — PROTIME-INR
INR: 1.73 — ABNORMAL HIGH (ref 0.00–1.49)
Prothrombin Time: 20.6 seconds — ABNORMAL HIGH (ref 11.6–15.2)

## 2011-01-15 NOTE — Progress Notes (Signed)
Labs drawn today for pt. Patient on 5, 4, 4 mg alternating.  Call patient at [phone] 

## 2011-01-15 NOTE — Telephone Encounter (Signed)
Pt notified to continue same dose of coumadin and to return in one week to repeat pt level.

## 2011-01-22 ENCOUNTER — Other Ambulatory Visit (HOSPITAL_COMMUNITY): Payer: Self-pay | Admitting: Oncology

## 2011-01-22 ENCOUNTER — Encounter (HOSPITAL_COMMUNITY): Payer: Medicaid Other

## 2011-01-22 ENCOUNTER — Encounter (HOSPITAL_COMMUNITY): Payer: Self-pay | Admitting: Oncology

## 2011-01-22 ENCOUNTER — Telehealth (HOSPITAL_COMMUNITY): Payer: Self-pay | Admitting: *Deleted

## 2011-01-22 DIAGNOSIS — I82409 Acute embolism and thrombosis of unspecified deep veins of unspecified lower extremity: Secondary | ICD-10-CM

## 2011-01-22 DIAGNOSIS — N289 Disorder of kidney and ureter, unspecified: Secondary | ICD-10-CM

## 2011-01-22 LAB — PROTIME-INR
INR: 2.56 — ABNORMAL HIGH (ref 0.00–1.49)
Prothrombin Time: 27.9 seconds — ABNORMAL HIGH (ref 11.6–15.2)

## 2011-01-22 NOTE — Telephone Encounter (Signed)
Instructions as below. Appointment date and time given to pt. Pt verbalizes understanding.

## 2011-01-22 NOTE — Progress Notes (Signed)
Labs drawn today for pt.  Patient on 5,4,4mg  alternating.  Call patient at [phone]. Patient unable to collect urine sample.  Patient was given cup to bring back on next lab appt.

## 2011-01-22 NOTE — Telephone Encounter (Signed)
Message copied by Dennie Maizes on Thu Jan 22, 2011  1:40 PM ------      Message from: Ellouise Newer III      Created: Thu Jan 22, 2011 12:41 PM       Same dose            PT/INR in 1 week

## 2011-01-29 ENCOUNTER — Other Ambulatory Visit (HOSPITAL_COMMUNITY): Payer: Self-pay | Admitting: Oncology

## 2011-01-29 ENCOUNTER — Encounter (HOSPITAL_BASED_OUTPATIENT_CLINIC_OR_DEPARTMENT_OTHER): Payer: Medicaid Other

## 2011-01-29 ENCOUNTER — Telehealth (HOSPITAL_COMMUNITY): Payer: Self-pay | Admitting: *Deleted

## 2011-01-29 DIAGNOSIS — I82409 Acute embolism and thrombosis of unspecified deep veins of unspecified lower extremity: Secondary | ICD-10-CM

## 2011-01-29 LAB — PROTIME-INR: Prothrombin Time: 24.7 seconds — ABNORMAL HIGH (ref 11.6–15.2)

## 2011-01-29 NOTE — Progress Notes (Signed)
Labs drawn today for pt.  Patient on 5,4,and 4mg  alternating of coud.  Call patient at [phone]

## 2011-01-29 NOTE — Telephone Encounter (Signed)
Pt instructed to return in 10 days for pt/inr and to continue same dose. He verbalized understanding of appt time/date and other instructions regarding coumadin.

## 2011-02-09 ENCOUNTER — Other Ambulatory Visit (HOSPITAL_COMMUNITY): Payer: Self-pay | Admitting: Oncology

## 2011-02-09 ENCOUNTER — Encounter (HOSPITAL_COMMUNITY): Payer: Medicaid Other

## 2011-02-09 DIAGNOSIS — I82409 Acute embolism and thrombosis of unspecified deep veins of unspecified lower extremity: Secondary | ICD-10-CM

## 2011-02-09 LAB — PROTIME-INR: INR: 2.49 — ABNORMAL HIGH (ref 0.00–1.49)

## 2011-02-09 NOTE — Progress Notes (Signed)
Labs drawn today for pt.  Patient on 5,4, and 4mg  alternating.  Call patient with results to [phone]

## 2011-02-16 ENCOUNTER — Other Ambulatory Visit (HOSPITAL_COMMUNITY): Payer: Self-pay | Admitting: Oncology

## 2011-02-16 ENCOUNTER — Encounter (HOSPITAL_COMMUNITY): Payer: Medicaid Other | Attending: Oncology

## 2011-02-16 VITALS — BP 116/72 | HR 56 | Temp 98.7°F | Wt 179.2 lb

## 2011-02-16 DIAGNOSIS — Z86718 Personal history of other venous thrombosis and embolism: Secondary | ICD-10-CM

## 2011-02-16 DIAGNOSIS — I82409 Acute embolism and thrombosis of unspecified deep veins of unspecified lower extremity: Secondary | ICD-10-CM | POA: Insufficient documentation

## 2011-02-16 DIAGNOSIS — E538 Deficiency of other specified B group vitamins: Secondary | ICD-10-CM

## 2011-02-16 DIAGNOSIS — E7210 Disorders of sulfur-bearing amino-acid metabolism, unspecified: Secondary | ICD-10-CM | POA: Insufficient documentation

## 2011-02-16 DIAGNOSIS — E7211 Homocystinuria: Secondary | ICD-10-CM

## 2011-02-16 DIAGNOSIS — Z7901 Long term (current) use of anticoagulants: Secondary | ICD-10-CM

## 2011-02-16 DIAGNOSIS — N289 Disorder of kidney and ureter, unspecified: Secondary | ICD-10-CM | POA: Insufficient documentation

## 2011-02-16 NOTE — Progress Notes (Signed)
This office note has been dictated.

## 2011-02-16 NOTE — Progress Notes (Signed)
Justin Gilmore presented for [employer]. Labs per MD order drawn via Peripheral Line 23 gauge needle inserted in right antecubital.  Good blood return present. Procedure without incident.  Needle removed intact. Patient tolerated procedure well. Coumadin dose is 5 / 5 / 4 / 4 / alternating.

## 2011-02-16 NOTE — Patient Instructions (Signed)
[facility]  Discharge Instructions  RECOMMENDATIONS MADE BY THE CONSULTANT AND ANY TEST RESULTS WILL BE SENT TO YOUR REFERRING DOCTOR.   EXAM FINDINGS BY MD TODAY AND SIGNS AND SYMPTOMS TO REPORT TO CLINIC OR PRIMARY MD: PT/INR will be done today.  MEDICATIONS PRESCRIBED: Refills will be called to your drug store.  INSTRUCTIONS GIVEN AND DISCUSSED: You will be due lab work for your PT level again next week.  SPECIAL INSTRUCTIONS/FOLLOW-UP: Other than your PT level you will have additional blood work in January, then your scans and then return to see the doctor.   I acknowledge that I have been informed and understand all the instructions given to me and received a copy. I do not have any more questions at this time, but understand that I may call the Specialty Clinic at [facility] at [phone] during business hours should I have any further questions or need assistance in obtaining follow-up care.    __________________________________________  _____________  __________ Signature of Patient or Authorized Representative            Date                   Time    __________________________________________ Nurse's Signature

## 2011-02-16 NOTE — Progress Notes (Signed)
CC:   Edward L. Juanetta Gosling, M.D.  IDENTIFYING STATEMENT:  The patient is a 65 year old man with a history of right extensive deep vein thrombosis diagnosed in March 2012.  He is on ongoing anticoagulation therapy.  He reports no bleeding or bruising. He has a history of alcohol intake and was also found to have folic acid deficiency and hyperhomocystinemia.  The patient is on folic acid.  MEDICATIONS:  Reviewed and updated.  ALLERGIES:  None.  PHYSICAL EXAMINATION:  General Appearance:  Alert and oriented x3. Vital Signs:  Pulse 56.  Blood pressure 116/72.  Temperature 98.7. Respirations 16.  Weight 179 pounds.  HEENT:  Head is atraumatic, normocephalic.  Sclerae are anicteric.  Mouth is moist.  Chest:  Clear. CVS:  Unremarkable.  Abdomen:  Soft, nontender.  Bowel sounds present. Extremities:  No calf tenderness.  LABORATORY DATA:  PT/INR pending.  IMPRESSION AND PLAN:  Mr. Dalesandro is a 65 year old man with history of an extensive femoropopliteal deep venous thrombosis diagnosed in March 2012 for which he is on anticoagulation.  His INRs have remained therapeutic.  The patient will continue with current dose and return for periodic monitoring.  The patient is due to receive a followup CT scan of the chest in January.  At the same time, he is to obtain Doppler of the lower extremities, repeat serum folic acid, and homocystine levels.    ______________________________ Laurice Record, M.D. LIO/MEDQ  D:  02/16/2011  T:  02/16/2011  Job:  [id number]

## 2011-02-19 ENCOUNTER — Other Ambulatory Visit (HOSPITAL_COMMUNITY): Payer: Medicaid Other

## 2011-02-20 ENCOUNTER — Encounter (HOSPITAL_COMMUNITY): Payer: Medicaid Other

## 2011-02-20 ENCOUNTER — Other Ambulatory Visit (HOSPITAL_COMMUNITY): Payer: Medicaid Other

## 2011-02-20 ENCOUNTER — Other Ambulatory Visit (HOSPITAL_COMMUNITY): Payer: Self-pay | Admitting: Oncology

## 2011-02-20 DIAGNOSIS — I82409 Acute embolism and thrombosis of unspecified deep veins of unspecified lower extremity: Secondary | ICD-10-CM

## 2011-02-20 LAB — PROTIME-INR
INR: 2.35 — ABNORMAL HIGH (ref 0.00–1.49)
Prothrombin Time: 26.1 seconds — ABNORMAL HIGH (ref 11.6–15.2)

## 2011-02-20 NOTE — Progress Notes (Unsigned)
Justin Gilmore presented for [employer]. Labs per MD order drawn via Peripheral Line 23 gauge needle inserted in right [facility] - for PT/INR Good blood return present. Procedure without incident.  Needle removed intact. Patient tolerated procedure well.  Per patient  Held coumadin 2 evenings and then started back taking 5 mg, 4 mg. Phone # [phone].

## 2011-02-26 ENCOUNTER — Other Ambulatory Visit (HOSPITAL_COMMUNITY): Payer: Medicaid Other

## 2011-02-27 ENCOUNTER — Encounter (HOSPITAL_COMMUNITY): Payer: Medicaid Other

## 2011-02-27 DIAGNOSIS — I82409 Acute embolism and thrombosis of unspecified deep veins of unspecified lower extremity: Secondary | ICD-10-CM

## 2011-02-27 DIAGNOSIS — E7211 Homocystinuria: Secondary | ICD-10-CM

## 2011-02-27 LAB — PROTIME-INR: Prothrombin Time: 16.8 seconds — ABNORMAL HIGH (ref 11.6–15.2)

## 2011-02-27 NOTE — Progress Notes (Signed)
Labs drawn today for pt.  Patient on 4mg  and 5mg  alternating.  Call patient at [phone]

## 2011-03-03 ENCOUNTER — Telehealth (HOSPITAL_COMMUNITY): Payer: Self-pay | Admitting: Oncology

## 2011-03-03 ENCOUNTER — Encounter (HOSPITAL_BASED_OUTPATIENT_CLINIC_OR_DEPARTMENT_OTHER): Payer: Medicaid Other

## 2011-03-03 ENCOUNTER — Other Ambulatory Visit (HOSPITAL_COMMUNITY): Payer: Self-pay | Admitting: Oncology

## 2011-03-03 DIAGNOSIS — I82409 Acute embolism and thrombosis of unspecified deep veins of unspecified lower extremity: Secondary | ICD-10-CM

## 2011-03-03 LAB — PROTIME-INR: Prothrombin Time: 18.6 seconds — ABNORMAL HIGH (ref 11.6–15.2)

## 2011-03-03 NOTE — Progress Notes (Signed)
Labs drawn today for pt.  Patient on 4mg and 5mg alternating.  Call patient at [phone] 

## 2011-03-11 ENCOUNTER — Encounter (HOSPITAL_BASED_OUTPATIENT_CLINIC_OR_DEPARTMENT_OTHER): Payer: Medicaid Other

## 2011-03-11 ENCOUNTER — Other Ambulatory Visit (HOSPITAL_COMMUNITY): Payer: Self-pay | Admitting: *Deleted

## 2011-03-11 ENCOUNTER — Telehealth (HOSPITAL_COMMUNITY): Payer: Self-pay | Admitting: *Deleted

## 2011-03-11 DIAGNOSIS — I82409 Acute embolism and thrombosis of unspecified deep veins of unspecified lower extremity: Secondary | ICD-10-CM

## 2011-03-11 DIAGNOSIS — N289 Disorder of kidney and ureter, unspecified: Secondary | ICD-10-CM

## 2011-03-11 DIAGNOSIS — E7211 Homocystinuria: Secondary | ICD-10-CM

## 2011-03-11 LAB — DIFFERENTIAL
Basophils Absolute: 0 10*3/uL (ref 0.0–0.1)
Basophils Relative: 1 % (ref 0–1)
Eosinophils Relative: 3 % (ref 0–5)
Monocytes Absolute: 0.3 10*3/uL (ref 0.1–1.0)
Neutro Abs: 2.7 10*3/uL (ref 1.7–7.7)

## 2011-03-11 LAB — BASIC METABOLIC PANEL
CO2: 24 mEq/L (ref 19–32)
Chloride: 103 mEq/L (ref 96–112)
Creatinine, Ser: 1.72 mg/dL — ABNORMAL HIGH (ref 0.50–1.35)
Glucose, Bld: 109 mg/dL — ABNORMAL HIGH (ref 70–99)

## 2011-03-11 LAB — FOLATE: Folate: >20 ng/mL

## 2011-03-11 LAB — CBC
MCHC: 31.5 g/dL (ref 30.0–36.0)
RDW: 20.3 % — ABNORMAL HIGH (ref 11.5–15.5)

## 2011-03-11 LAB — IRON AND TIBC
Saturation Ratios: 6 % — ABNORMAL LOW (ref 20–55)
UIBC: 396 ug/dL (ref 125–400)

## 2011-03-11 LAB — FERRITIN: Ferritin: 16 ng/mL — ABNORMAL LOW (ref 22–322)

## 2011-03-11 NOTE — Telephone Encounter (Signed)
Patient notified to come in on Jan 2 @ 840 for pt/inr. He verbalized understanding.

## 2011-03-11 NOTE — Progress Notes (Signed)
Justin Gilmore presented for [employer]. Labs per MD order drawn via Peripheral Line 24 gauge needle inserted in rt ac  Good blood return present. Procedure without incident.  Needle removed intact. Patient tolerated procedure well.  Labs drawn today include: BMET, CBC diff, folate, ferritin, iron & tibc, homocysteine, pt/inr, renal panel. We went ahead and drew labs today that we due the 1st of January because Dr. Kristian Covey had also ordered the most of the same tests. We were trying not to repeat the same tests 1-2 weeks apart.   Same dose coumadin. INR in 7 days per [place]. Appt on Jan 2 @ 840. Patient notified.

## 2011-03-11 NOTE — Telephone Encounter (Signed)
Same dose coumadin inr in 7 days per Dr. Mariel Sleet

## 2011-03-12 LAB — HOMOCYSTEINE: Homocysteine: 25 umol/L — ABNORMAL HIGH (ref 4.0–15.4)

## 2011-03-12 NOTE — Progress Notes (Signed)
Labs faxed to Dr. Susa Griffins office today at 1620.

## 2011-03-17 DIAGNOSIS — A048 Other specified bacterial intestinal infections: Secondary | ICD-10-CM

## 2011-03-17 HISTORY — DX: Other specified bacterial intestinal infections: A04.8

## 2011-03-18 ENCOUNTER — Encounter (HOSPITAL_COMMUNITY): Payer: Medicaid Other | Attending: Oncology

## 2011-03-18 DIAGNOSIS — D509 Iron deficiency anemia, unspecified: Secondary | ICD-10-CM | POA: Insufficient documentation

## 2011-03-18 DIAGNOSIS — N289 Disorder of kidney and ureter, unspecified: Secondary | ICD-10-CM

## 2011-03-18 DIAGNOSIS — I82409 Acute embolism and thrombosis of unspecified deep veins of unspecified lower extremity: Secondary | ICD-10-CM

## 2011-03-18 LAB — RENAL FUNCTION PANEL
BUN: 22 mg/dL (ref 6–23)
Chloride: 108 mEq/L (ref 96–112)
Creatinine, Ser: 1.88 mg/dL — ABNORMAL HIGH (ref 0.50–1.35)
Glucose, Bld: 109 mg/dL — ABNORMAL HIGH (ref 70–99)
Phosphorus: 3.3 mg/dL (ref 2.3–4.6)
Potassium: 4.3 mEq/L (ref 3.5–5.1)

## 2011-03-18 NOTE — Progress Notes (Signed)
Labs drawn today for pt and renal.  Patient on 4mg  and 5mg  alternating.  Call patient at [phone]

## 2011-03-23 ENCOUNTER — Other Ambulatory Visit (HOSPITAL_COMMUNITY): Payer: Medicaid Other

## 2011-03-23 ENCOUNTER — Ambulatory Visit (HOSPITAL_COMMUNITY)
Admission: RE | Admit: 2011-03-23 | Discharge: 2011-03-23 | Disposition: A | Discharge status: Home / Self Care | Payer: Medicaid Other | Source: Ambulatory Visit | Attending: Oncology | Admitting: Oncology

## 2011-03-23 ENCOUNTER — Ambulatory Visit (HOSPITAL_COMMUNITY)
Admission: RE | Admit: 2011-03-23 | Discharge: 2011-03-23 | Disposition: A | Discharge status: Home / Self Care | Payer: Medicaid Other | Source: Ambulatory Visit | Attending: Hematology and Oncology | Admitting: Hematology and Oncology

## 2011-03-23 DIAGNOSIS — N289 Disorder of kidney and ureter, unspecified: Secondary | ICD-10-CM | POA: Insufficient documentation

## 2011-03-23 DIAGNOSIS — R911 Solitary pulmonary nodule: Secondary | ICD-10-CM

## 2011-03-23 DIAGNOSIS — Z09 Encounter for follow-up examination after completed treatment for conditions other than malignant neoplasm: Secondary | ICD-10-CM | POA: Insufficient documentation

## 2011-03-23 DIAGNOSIS — E7211 Homocystinuria: Secondary | ICD-10-CM

## 2011-03-23 DIAGNOSIS — E119 Type 2 diabetes mellitus without complications: Secondary | ICD-10-CM | POA: Insufficient documentation

## 2011-03-23 DIAGNOSIS — I82409 Acute embolism and thrombosis of unspecified deep veins of unspecified lower extremity: Secondary | ICD-10-CM

## 2011-03-23 DIAGNOSIS — R9389 Abnormal findings on diagnostic imaging of other specified body structures: Secondary | ICD-10-CM

## 2011-03-23 DIAGNOSIS — I82891 Chronic embolism and thrombosis of other specified veins: Secondary | ICD-10-CM | POA: Insufficient documentation

## 2011-03-23 DIAGNOSIS — I10 Essential (primary) hypertension: Secondary | ICD-10-CM | POA: Insufficient documentation

## 2011-03-24 ENCOUNTER — Ambulatory Visit (HOSPITAL_COMMUNITY)
Admission: RE | Admit: 2011-03-24 | Discharge: 2011-03-24 | Disposition: A | Discharge status: Home / Self Care | Payer: Medicaid Other | Source: Ambulatory Visit | Attending: Oncology | Admitting: Oncology

## 2011-03-24 DIAGNOSIS — Z86718 Personal history of other venous thrombosis and embolism: Secondary | ICD-10-CM | POA: Insufficient documentation

## 2011-03-24 DIAGNOSIS — M7989 Other specified soft tissue disorders: Secondary | ICD-10-CM | POA: Insufficient documentation

## 2011-03-24 DIAGNOSIS — I10 Essential (primary) hypertension: Secondary | ICD-10-CM | POA: Insufficient documentation

## 2011-03-24 DIAGNOSIS — E119 Type 2 diabetes mellitus without complications: Secondary | ICD-10-CM | POA: Insufficient documentation

## 2011-03-25 ENCOUNTER — Encounter (HOSPITAL_COMMUNITY): Payer: Medicaid Other

## 2011-03-25 ENCOUNTER — Ambulatory Visit (HOSPITAL_COMMUNITY): Payer: Medicaid Other | Admitting: Oncology

## 2011-03-30 ENCOUNTER — Encounter (HOSPITAL_BASED_OUTPATIENT_CLINIC_OR_DEPARTMENT_OTHER): Payer: Medicaid Other | Admitting: Oncology

## 2011-03-30 VITALS — BP 132/76 | HR 50 | Temp 99.1°F | Wt 180.0 lb

## 2011-03-30 DIAGNOSIS — E611 Iron deficiency: Secondary | ICD-10-CM

## 2011-03-30 DIAGNOSIS — I82409 Acute embolism and thrombosis of unspecified deep veins of unspecified lower extremity: Secondary | ICD-10-CM

## 2011-03-30 DIAGNOSIS — D509 Iron deficiency anemia, unspecified: Secondary | ICD-10-CM

## 2011-03-30 DIAGNOSIS — E538 Deficiency of other specified B group vitamins: Secondary | ICD-10-CM

## 2011-03-30 DIAGNOSIS — N189 Chronic kidney disease, unspecified: Secondary | ICD-10-CM

## 2011-03-30 MED ORDER — SODIUM CHLORIDE 0.9 % IV SOLN: 1020.0000 mg | Freq: Once | INTRAVENOUS | Status: DC

## 2011-03-30 NOTE — Progress Notes (Signed)
CC:   Ladona Horns. Mariel Sleet, MD [place] Deputy Juanetta Gosling, M.D.  DIAGNOSES: 1. Deep vein thrombosis of the right leg. 2. Hyperhomocystinemia that may have predisposed him to the deep     venous thrombosis. 3. Folic acid deficiency at presentation. 4. Iron deficiency at presentation. 5. Chronic renal insufficiency at presentation. 6. History of tobacco abuse in the past. 7. History of hypertension.  HISTORY:  Mr. Crossan actually received IV iron in the form of Feraheme 1020 mg on 09/19/2010.  At the time of presentation he was found to be severely iron deficient and severely folic acid deficient.  His folic acid level was 2.4 on 09/03/2010.  Ferritin was 20 but his serum iron was only 39, TIBC was 413 and his percent saturation was only 9%.  It is of interest that his iron studies that were done on the 26th of December interestingly show that his iron is even lower at 25, TIBC is higher at 421, and his percent saturation is down to 6 and his ferritin is down to 16 in spite of the IV Feraheme.  So he is actually losing blood.  We confirmed that his folic acid level was well within the normal range at greater than 20 but a homocystine level did not get repeated.  He still has a creatinine that is elevated at 1.88 on the 2nd of January.  He of course has a history of excessive alcohol use as well.  At some place in his history it says he is also a diabetic though his sugar recently was very well controlled and presently he is not on any diabetic medications.  PHYSICAL EXAMINATION:  His vital signs today are all very stable, 180 pounds for his weight, blood pressure 132/76, pulse is low, right around 50.  He is afebrile.  Not in any acute distress.  Not in any pain.  I did not examine him further.  So we went over his lab work which is abnormal, of course, with the iron deficiency still an issue and he has never been referred to a gastroenterologist which I will do today.  He absolutely needs a  workup. We can do some Hemoccult cards, but regardless he needs a GI consultation since he has also never had colonoscopy.  He is 66 years old now.  So he is agreeable to these things.  We will set him up for Feraheme once again this coming Monday.  A month later we will get a CBC, ferritin and homocystine and the ferritin will be our new baseline level at that time.  Either myself or Jenita Seashore, my PA, will see him right after those labs are drawn the end of February.  By then he will have seen a GI doctor.  I tried to emphasize to him through counseling the importance of followup, taking his medications, etc.   ______________________________ Ladona Horns. Mariel Sleet, MD ESN/MEDQ  D:  03/30/2011  T:  03/30/2011  Job:  [id number]

## 2011-03-30 NOTE — Patient Instructions (Signed)
Justin Gilmore  [id number] [birth year]   [facility]  Discharge Instructions  RECOMMENDATIONS MADE BY THE CONSULTANT AND ANY TEST RESULTS WILL BE SENT TO YOUR REFERRING DOCTOR.   EXAM FINDINGS BY MD TODAY AND SIGNS AND SYMPTOMS TO REPORT TO CLINIC OR PRIMARY MD: Will do feraheme (iron) infusion on Monday of next week.  Need to collect 3 stool samples to check for blood and you need have a GI consult to see why you are losing iron.  MEDICATIONS PRESCRIBED: none     SPECIAL INSTRUCTIONS/FOLLOW-UP: Lab work Needed :  Collect 3 stool samples to check for blood, Feraheme infusion next week, blood work 1 month later  Return to Clinic on :  To see PA or Dr. Mariel Sleet after bloodwork in February.   I acknowledge that I have been informed and understand all the instructions given to me and received a copy. I do not have any more questions at this time, but understand that I may call the Specialty Clinic at [facility] at [phone] during business hours should I have any further questions or need assistance in obtaining follow-up care.    __________________________________________  _____________  __________ Signature of Patient or Authorized Representative            Date                   Time    __________________________________________ Nurse's Signature

## 2011-03-30 NOTE — Progress Notes (Signed)
This office note has been dictated.

## 2011-04-06 ENCOUNTER — Encounter (HOSPITAL_BASED_OUTPATIENT_CLINIC_OR_DEPARTMENT_OTHER): Payer: Medicaid Other

## 2011-04-06 ENCOUNTER — Other Ambulatory Visit (HOSPITAL_COMMUNITY): Payer: Self-pay | Admitting: Oncology

## 2011-04-06 VITALS — BP 119/67 | HR 48 | Temp 98.3°F

## 2011-04-06 DIAGNOSIS — E611 Iron deficiency: Secondary | ICD-10-CM

## 2011-04-06 DIAGNOSIS — I82409 Acute embolism and thrombosis of unspecified deep veins of unspecified lower extremity: Secondary | ICD-10-CM

## 2011-04-06 DIAGNOSIS — D509 Iron deficiency anemia, unspecified: Secondary | ICD-10-CM

## 2011-04-06 LAB — OCCULT BLOOD X 1 CARD TO LAB, STOOL
Fecal Occult Bld: NEGATIVE
Fecal Occult Bld: NEGATIVE
Fecal Occult Bld: NEGATIVE

## 2011-04-06 LAB — PROTIME-INR: INR: 1.73 — ABNORMAL HIGH (ref 0.00–1.49)

## 2011-04-06 MED ORDER — SODIUM CHLORIDE 0.9 % IV SOLN
1020.0000 mg | Freq: Once | INTRAVENOUS | Status: DC
  Filled 2011-04-06: qty 34

## 2011-04-06 MED ORDER — SODIUM CHLORIDE 0.9 % IV SOLN
INTRAVENOUS | Status: DC
  Administered 2011-04-06: 10:00:00 via INTRAVENOUS

## 2011-04-06 MED ORDER — SODIUM CHLORIDE 0.9 % IV SOLN
1020.0000 mg | Freq: Once | INTRAVENOUS | Status: AC
  Administered 2011-04-06: 1020 mg via INTRAVENOUS
  Filled 2011-04-06: qty 34

## 2011-04-06 MED ORDER — SODIUM CHLORIDE 0.9 % IJ SOLN
INTRAMUSCULAR | Status: AC
  Administered 2011-04-06: 10 mL via INTRAVENOUS
  Filled 2011-04-06: qty 10

## 2011-04-06 MED ORDER — SODIUM CHLORIDE 0.9 % IJ SOLN
10.0000 mL | INTRAMUSCULAR | Status: DC | PRN
  Administered 2011-04-06: 10 mL via INTRAVENOUS
  Filled 2011-04-06: qty 10

## 2011-04-06 NOTE — Progress Notes (Signed)
Labs drawn today pt.  Patient on 4 and 5mg  alternating.  Call patient at [phone]

## 2011-04-06 NOTE — Progress Notes (Signed)
Addended by: Edythe Lynn A on: 04/06/2011 11:12 AM   Modules accepted: Orders

## 2011-04-10 ENCOUNTER — Encounter: Payer: Self-pay | Admitting: Internal Medicine

## 2011-04-10 ENCOUNTER — Ambulatory Visit (INDEPENDENT_AMBULATORY_CARE_PROVIDER_SITE_OTHER): Payer: Medicaid Other | Admitting: Internal Medicine

## 2011-04-10 VITALS — BP 144/75 | HR 54 | Temp 98.2°F | Ht 68.0 in | Wt 180.6 lb

## 2011-04-10 DIAGNOSIS — D509 Iron deficiency anemia, unspecified: Secondary | ICD-10-CM

## 2011-04-10 NOTE — Patient Instructions (Addendum)
Colonoscopy and possible egd in the near future - after speaking with Dr. Mariel Sleet, he felt it was safe to stop the Coumadin 4 days prior to the procedure and that's what we are going to do.

## 2011-04-10 NOTE — Progress Notes (Signed)
Primary Care Physician: [facility]/referring physician: Dr. Glenford Peers Primary Gastroenterologist:  Dr. Jena Gauss  Pre-Procedure History & Physical: HPI:  Justin Gilmore is a 66 y.o. male here for for further evaluation of iron deficiency anemia.  He does have an extensive right lower kidney DVT last year. He is on Coumadin. Clinically, has not had any melena, hematochezia, abdominal pain, early satiety, nausea vomiting, odynophagia or dysphagia. He has been repeatedly Hemoccult negative. He's never had a GI evaluation of any sort including colonoscopy. Because of the extensive nature of his right lower extremity DVT he is to be continued on Coumadin for one year. No family history of colon polyps or colon cancer or other gastrointestinal malignancy.   Past Medical History  Diagnosis Date  . Hypertension   . Tobacco abuse   . DVT (deep venous thrombosis) 2012    Right lower ext.  Marland Kitchen GERD (gastroesophageal reflux disease)   . Diabetes mellitus   . Renal cyst   . Renal failure     Stage 2- Stage 3  . DVT (deep venous thrombosis) 11/11/2010  . Arterial insufficiency 11/11/2010  . Renal insufficiency 11/11/2010  . Hypertension 11/11/2010  . GERD (gastroesophageal reflux disease) 11/11/2010  . 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 ETOH abuse 12/10/2010  . Folic acid deficiency 12/10/2010    Past Surgical History  Procedure Date  . 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 Leonides Sake  . Back surgery     Prior to Admission medications   Medication Sig Start Date End Date Taking? Authorizing Provider  amLODipine (NORVASC) 10 MG tablet Take 10 mg by mouth daily.     Yes Historical Provider, MD  bisacodyl (DULCOLAX) 5 MG EC tablet Take 10 mg by mouth daily as needed.     Yes Historical Provider, MD  folic acid (FOLVITE) 1 MG tablet Take 1 tablet (1 mg total) by mouth 2 (two) times daily. 12/10/10 12/10/11 Yes Dellis Anes, PA    hydrochlorothiazide 25 MG tablet Take 25 mg by mouth daily.     Yes Historical Provider, MD  metoprolol (LOPRESSOR) 50 MG tablet Take 50 mg by mouth 2 (two) times daily.     Yes Historical Provider, MD  warfarin (COUMADIN) 5 MG tablet Take 5 mg by mouth daily. Alternates 5mg  5mg  4mg  4mg    Yes Historical Provider, MD  naproxen sodium (ANAPROX) 220 MG tablet Take 220 mg by mouth as needed. As needed for pain    Historical Provider, MD  nicotine (NICODERM CQ - DOSED IN MG/24 HOURS) 14 mg/24hr patch Place 1 patch onto the skin daily.      Historical Provider, MD  traMADol (ULTRAM) 50 MG tablet Take 50 mg by mouth every 6 (six) hours as needed.      Historical Provider, MD    Allergies as of 04/10/2011  . (No Known Allergies)    Family History  Problem Relation Age of Onset  . Diabetes Brother     History   Social History  . Marital Status: Single    Spouse Name: N/A    Number of Children: N/A  . Years of Education: N/A   Occupational History  . Not on file.   Social History Main Topics  . Smoking status: Current Everyday Smoker -- 1.5 packs/day for 50 years    Types: Cigarettes  . Smokeless tobacco: Not on file  . Alcohol Use: Yes  . Drug Use:   . Sexually Active:  Other Topics Concern  . Not on file   Social History Narrative  . No narrative on file    Review of Systems: See HPI, otherwise negative ROS  Physical Exam: BP 144/75  Pulse 54  Temp(Src) 98.2 F (36.8 C) (Temporal)  Ht 5\' 8"  (1.727 m)  Wt 180 lb 9.6 oz (81.92 kg)  BMI 27.46 kg/m2 General:   Alert,   Somewhat disheveled gentleman appears in no acute distress. He is accompanied by his wife Well-developed, well-nourished, pleasant and cooperative in NAD Skin:  Intact without significant lesions or rashes. Eyes:  Sclera clear, no icterus.   Conjunctiva pink. Ears:  Normal auditory acuity. Nose:  No deformity, discharge,  or lesions. Mouth:  No deformity or lesions. Neck:  Supple; no masses or  thyromegaly. No significant cervical adenopathy. Lungs:  Clear throughout to auscultation.   No wheezes, crackles, or rhonchi. No acute distress. Heart:  Regular rate and rhythm; no murmurs, clicks, rubs,  or gallops. Abdomen: Non-distended, normal bowel sounds.  Soft and nontender without appreciable mass or hepatosplenomegaly.  Pulses:  Normal pulses noted. Extremities:  Without clubbing or edema.  Impression/Plan:

## 2011-04-10 NOTE — Assessment & Plan Note (Signed)
Justin Gilmore is a very pleasant 66 year old gentleman found to have profound iron deficiency anemia last year after being diagnosed with an extensive right lower extremity DVT. He is essentially devoid of any GI tract symptoms. He has never had his colon looked at.  His review of systems is essentially negative. I spoke to Dr. Mariel Sleet - he planned to keep the patient on Coumadin for one year given the extensive nature of the clot in his leg. However, he felt it would be safe to take him off Coumadin long enough to perform the endoscopic evaluation.  Therefore,  I have offered the patient both a colonoscopy with a possible EGD to follow in the near future at the hospital. We'll stop his Coumadin 4 days prior to the procedure. Risks, benefits, limitations, alternatives and imponderables have been reviewed. Questions have been answered. All parties agreeable.  Further recommendations to follow

## 2011-04-13 ENCOUNTER — Other Ambulatory Visit: Payer: Self-pay | Admitting: Gastroenterology

## 2011-04-13 DIAGNOSIS — D509 Iron deficiency anemia, unspecified: Secondary | ICD-10-CM

## 2011-04-14 ENCOUNTER — Encounter (HOSPITAL_COMMUNITY): Payer: Self-pay | Admitting: Pharmacy Technician

## 2011-04-16 ENCOUNTER — Encounter (HOSPITAL_COMMUNITY): Payer: Medicaid Other

## 2011-04-16 ENCOUNTER — Other Ambulatory Visit (HOSPITAL_COMMUNITY): Payer: Self-pay | Admitting: Oncology

## 2011-04-16 ENCOUNTER — Telehealth (HOSPITAL_COMMUNITY): Payer: Self-pay | Admitting: *Deleted

## 2011-04-16 DIAGNOSIS — I82409 Acute embolism and thrombosis of unspecified deep veins of unspecified lower extremity: Secondary | ICD-10-CM

## 2011-04-16 LAB — PROTIME-INR
INR: 3.28 — ABNORMAL HIGH (ref 0.00–1.49)
Prothrombin Time: 33.9 seconds — ABNORMAL HIGH (ref 11.6–15.2)

## 2011-04-16 NOTE — Telephone Encounter (Signed)
SPOKE WITH PT, INSTRUCTIONS AS BELOW. VERBALIZED UNDERSTANDING.

## 2011-04-16 NOTE — Telephone Encounter (Signed)
Message copied by Dennie Maizes on Thu Apr 16, 2011 11:57 AM ------      Message from: Ellouise Newer III      Created: Thu Apr 16, 2011 11:29 AM       Same Dose            PT/INR on Tuesday

## 2011-04-20 ENCOUNTER — Other Ambulatory Visit (HOSPITAL_COMMUNITY): Payer: Self-pay | Admitting: *Deleted

## 2011-04-20 ENCOUNTER — Telehealth (HOSPITAL_COMMUNITY): Payer: Self-pay | Admitting: *Deleted

## 2011-04-20 DIAGNOSIS — I82409 Acute embolism and thrombosis of unspecified deep veins of unspecified lower extremity: Secondary | ICD-10-CM

## 2011-04-20 NOTE — Telephone Encounter (Signed)
Spoke with pt's daughter Nicole Cella. Informed her Dr.Neijstrom was in agreement with Dr.Rourk. No other medication was ordered for pt while Coumadin on hold. Instruct pt is to resume taking Coumadin at same dose the evening after the procedure. Pt will take a dose on Wed and Thur evening and come for PT/INR on Friday am.

## 2011-04-20 NOTE — Telephone Encounter (Signed)
Message copied by Dennie Maizes on Mon Apr 20, 2011 11:49 AM ------      Message from: Mariel Sleet, ERIC S      Created: Fri Apr 17, 2011  1:01 PM       Yes I am.      ----- Message -----         From: Valentina Shaggy Irean Kendricks, RN         Sent: 04/16/2011  12:44 PM           To: Randall An, MD            Dorothy reports that Jediah is having a colonoscopy next Wed 04/22/11. Reports Dr.Rourk told then to hold Coumadin on Saturday 2/2 until after colonoscopy. Are you in agreement and does he need to be covered with anything else?

## 2011-04-21 ENCOUNTER — Other Ambulatory Visit (HOSPITAL_COMMUNITY): Payer: Medicaid Other

## 2011-04-21 MED ORDER — SODIUM CHLORIDE 0.45 % IV SOLN
Freq: Once | INTRAVENOUS | Status: AC
  Administered 2011-04-22: 09:00:00 via INTRAVENOUS

## 2011-04-22 ENCOUNTER — Encounter (HOSPITAL_COMMUNITY)
Admission: RE | Disposition: A | Discharge status: Home / Self Care | Payer: Self-pay | Source: Ambulatory Visit | Attending: Internal Medicine

## 2011-04-22 ENCOUNTER — Ambulatory Visit (HOSPITAL_COMMUNITY)
Admission: RE | Admit: 2011-04-22 | Discharge: 2011-04-22 | Disposition: A | Discharge status: Home / Self Care | Payer: Medicaid Other | Source: Ambulatory Visit | Attending: Internal Medicine | Admitting: Internal Medicine

## 2011-04-22 ENCOUNTER — Encounter (HOSPITAL_COMMUNITY): Payer: Self-pay | Admitting: *Deleted

## 2011-04-22 ENCOUNTER — Other Ambulatory Visit: Payer: Self-pay | Admitting: Internal Medicine

## 2011-04-22 DIAGNOSIS — K2100 Gastro-esophageal reflux disease with esophagitis, without bleeding: Secondary | ICD-10-CM | POA: Insufficient documentation

## 2011-04-22 DIAGNOSIS — K449 Diaphragmatic hernia without obstruction or gangrene: Secondary | ICD-10-CM | POA: Insufficient documentation

## 2011-04-22 DIAGNOSIS — K2940 Chronic atrophic gastritis without bleeding: Secondary | ICD-10-CM | POA: Insufficient documentation

## 2011-04-22 DIAGNOSIS — K5730 Diverticulosis of large intestine without perforation or abscess without bleeding: Secondary | ICD-10-CM

## 2011-04-22 DIAGNOSIS — I10 Essential (primary) hypertension: Secondary | ICD-10-CM | POA: Insufficient documentation

## 2011-04-22 DIAGNOSIS — E119 Type 2 diabetes mellitus without complications: Secondary | ICD-10-CM | POA: Insufficient documentation

## 2011-04-22 DIAGNOSIS — K2980 Duodenitis without bleeding: Secondary | ICD-10-CM | POA: Insufficient documentation

## 2011-04-22 DIAGNOSIS — D126 Benign neoplasm of colon, unspecified: Secondary | ICD-10-CM

## 2011-04-22 DIAGNOSIS — K2960 Other gastritis without bleeding: Secondary | ICD-10-CM

## 2011-04-22 DIAGNOSIS — Z7901 Long term (current) use of anticoagulants: Secondary | ICD-10-CM | POA: Insufficient documentation

## 2011-04-22 DIAGNOSIS — D509 Iron deficiency anemia, unspecified: Secondary | ICD-10-CM | POA: Insufficient documentation

## 2011-04-22 DIAGNOSIS — Z79899 Other long term (current) drug therapy: Secondary | ICD-10-CM | POA: Insufficient documentation

## 2011-04-22 DIAGNOSIS — A048 Other specified bacterial intestinal infections: Secondary | ICD-10-CM | POA: Insufficient documentation

## 2011-04-22 DIAGNOSIS — D128 Benign neoplasm of rectum: Secondary | ICD-10-CM | POA: Insufficient documentation

## 2011-04-22 HISTORY — PX: COLONOSCOPY: SHX174

## 2011-04-22 HISTORY — PX: ESOPHAGOGASTRODUODENOSCOPY: SHX1529

## 2011-04-22 SURGERY — COLONOSCOPY WITH ESOPHAGOGASTRODUODENOSCOPY (EGD)
Anesthesia: Moderate Sedation

## 2011-04-22 MED ORDER — MIDAZOLAM HCL 5 MG/5ML IJ SOLN
INTRAMUSCULAR | Status: DC | PRN
  Administered 2011-04-22: 2 mg via INTRAVENOUS
  Administered 2011-04-22: 1 mg via INTRAVENOUS

## 2011-04-22 MED ORDER — MEPERIDINE HCL 100 MG/ML IJ SOLN
INTRAMUSCULAR | Status: AC
  Filled 2011-04-22: qty 2

## 2011-04-22 MED ORDER — MEPERIDINE HCL 100 MG/ML IJ SOLN
INTRAMUSCULAR | Status: DC | PRN
  Administered 2011-04-22: 50 mg via INTRAVENOUS
  Administered 2011-04-22: 25 mg via INTRAVENOUS

## 2011-04-22 MED ORDER — STERILE WATER FOR IRRIGATION IR SOLN
Status: DC | PRN
  Administered 2011-04-22: 10:00:00

## 2011-04-22 MED ORDER — MIDAZOLAM HCL 5 MG/5ML IJ SOLN
INTRAMUSCULAR | Status: AC
  Filled 2011-04-22: qty 10

## 2011-04-22 MED ORDER — BUTAMBEN-TETRACAINE-BENZOCAINE 2-2-14 % EX AERO
INHALATION_SPRAY | CUTANEOUS | Status: DC | PRN
  Administered 2011-04-22: 1 via TOPICAL

## 2011-04-22 MED ORDER — PANTOPRAZOLE SODIUM 40 MG PO TBEC: 40.0000 mg | DELAYED_RELEASE_TABLET | Freq: Every day | ORAL | Status: DC

## 2011-04-22 NOTE — H&P (Signed)
  I have seen & examined the patient prior to the procedure(s) today and reviewed the history and physical/consultation.  There have been no changes.  After consideration of the risks, benefits, alternatives and imponderables, the patient has consented to the procedure(s).   

## 2011-04-22 NOTE — Op Note (Signed)
[facility] [address] [place], [place]  [address]  COLONOSCOPY PROCEDURE REPORT  PATIENT:  Naven, Giambalvo  MR#:  [id number] BIRTHDATE:  Oct 25, 1945, 65 yrs. old  GENDER:  male ENDOSCOPIST:  R. Roetta Sessions, MD FACP [facility] REF. BY:  Glenford Peers, M.D. PROCEDURE DATE:  04/22/2011 PROCEDURE:  colonoscopy with snare polypectomy  INDICATIONS:  iron deficiency anemia; first ever colonoscopy  INFORMED CONSENT:  The risks, benefits, alternatives and imponderables including but not limited to bleeding, perforation as well as the possibility of a missed lesion have been reviewed. The potential for biopsy, lesion removal, etc. have also been discussed.  Questions have been answered.  All parties agreeable. Please see the history and physical in the medical record for more information.  MEDICATIONS:  Demerol 75 mg IV and Versed 3 mg IV in divided doses  DESCRIPTION OF PROCEDURE:  After a digital rectal exam was performed, the EC-[id number] ([id number]) colonoscope was advanced from the anus through the rectum and colon to the area of the cecum, ileocecal valve and appendiceal orifice.  The cecum was deeply intubated.  These structures were well-seen and photographed for the record.  From the level of the cecum and ileocecal valve, the scope was slowly and cautiously withdrawn.  The mucosal surfaces were carefully surveyed utilizing scope tip deflection to facilitate fold flattening as needed.  The scope was pulled down into the rectum where a thorough examination including retroflexion was performed. <<PROCEDUREIMAGES>>  FINDINGS:suboptimal prep. Scattered pancolonic diverticula. Polyps in the rectum at 10 cm, the sigmoid segment and at the splenic flexure-the largest approximately 8 mm in dimensions at the splenic flexure.  Internal hemorrhoids  THERAPEUTIC / DIAGNOSTIC MANEUVERS PERFORMED:  Multiple hot snare polypectomies performed. All polyps seen were  removed.  COMPLICATIONS:  none  CECAL WITHDRAWAL TIME:  15 minutes  IMPRESSION:  Multiple colonic polyps - status post multiple snare polypectomies.  colonic diverticulosis. Internal hemorrhoids.  I do not believe the polyps were large enough to produce a picture of iron deficiency anemia via a mechanism of GI bleeding.  RECOMMENDATIONS:   Followup pathology. See EGD report.  ______________________________ R. Roetta Sessions, MD Caleen Essex  CC:  Glenford Peers, M.D.  n. eSIGNED:   R. Roetta Sessions at 04/22/2011 10:06 AM  Webb Laws,   [phone]

## 2011-04-22 NOTE — Op Note (Signed)
[facility] [address] [place], [place]  [address]  ENDOSCOPY PROCEDURE REPORT  PATIENT:  Gilmore, Justin  MR#:  [id number] BIRTHDATE:  [birth year], 65 yrs. old  GENDER:  male  ENDOSCOPIST:  R. Roetta Sessions, MD Caleen Essex Referred by:  Glenford Peers, M.D.  PROCEDURE DATE:  04/22/2011 PROCEDURE:  EGD with gastric and duodenal biopsy  INDICATIONS:  iron deficiency anemia; see colonoscopy report  INFORMED CONSENT:   The risks, benefits, limitations, alternatives and imponderables have been discussed.  The potential for biopsy, esophogeal dilation, etc. have also been reviewed.  Questions have been answered.  All parties agreeable.  Please see the history and physical in the medical record for more information.  MEDICATIONS:    Versed 3 mg IV and Demerol 75 mg IV in divided dose. Cetacaine spray  DESCRIPTION OF PROCEDURE:   The AO-[id number] ([id number]) endoscope was introduced through the mouth and advanced to the second portion of the duodenum without difficulty or limitations.  The mucosal surfaces were surveyed very carefully during advancement of the scope and upon withdrawal.  Retroflexion view of the proximal stomach and esophagogastric junction was performed.  <<PROCEDUREIMAGES>>  FINDINGS:  extensive "geographic" erosions involving the distal one third of the tubular esophagus.. No evidence of Barrett's esophagus. The tubular esophagus was widely patent throughout its course. Stomach emptied. Diffuse gastric erosions. Small hiatal hernia. Deformity and multiple erosions localized to the antral mucosa. Pylorus patent and easily traversed. Examination of the bulb and second portion revealed diffuse erosions with partially healing ulcers in the bulb. please see photographs.  THERAPEUTIC / DIAGNOSTIC MANEUVERS PERFORMED:  Biopsies of the gastric mucosa and second portion of the duodenum were taken.  COMPLICATIONS:   None  IMPRESSION:    Severe erosive reflux  esophagitis; hiatal hernia. Diffuse gastric and duodenal erosions with bulbar ulceration. Status post gastric and duodenal biopsy. Today's findings on EGD could easily explain iron deficiency anemia via a mechanism of GI bleeding  RECOMMENDATIONS:  PPI therapy.  no NSAIDs. Followup on pathology  ______________________________ R. Roetta Sessions, MD Caleen Essex  CC:  n. eSIGNED:   R. Roetta Sessions at 04/22/2011 10:28 AM  Webb Laws, [phone]

## 2011-04-24 ENCOUNTER — Other Ambulatory Visit (HOSPITAL_COMMUNITY): Payer: Self-pay | Admitting: Oncology

## 2011-04-24 ENCOUNTER — Encounter (HOSPITAL_COMMUNITY): Payer: Medicaid Other | Attending: Oncology

## 2011-04-24 DIAGNOSIS — I82409 Acute embolism and thrombosis of unspecified deep veins of unspecified lower extremity: Secondary | ICD-10-CM

## 2011-04-24 DIAGNOSIS — E7210 Disorders of sulfur-bearing amino-acid metabolism, unspecified: Secondary | ICD-10-CM | POA: Insufficient documentation

## 2011-04-24 NOTE — Progress Notes (Signed)
Labs drawn today for pt.  Patient on 5mg  and 4mg  alternating.  Call patient at [phone]

## 2011-04-26 ENCOUNTER — Encounter: Payer: Self-pay | Admitting: Internal Medicine

## 2011-04-26 NOTE — Progress Notes (Signed)
Pt has hp gastritis; need prevpak or generic equivalent x 14 days; please call in Rx; need to decrease coumad to 3 mg daily while on hp rx; then resume regular regimen; need INR 7 days into hp treatment

## 2011-04-29 ENCOUNTER — Telehealth (HOSPITAL_COMMUNITY): Payer: Self-pay | Admitting: *Deleted

## 2011-04-29 NOTE — Progress Notes (Signed)
Pt and pts friend Nicole Cella aware. Rx for prevpac called to [facility].   Nicole Cella was concerned about changing coumadin d/t his inr was "messed up" the last time they had it checked. Informed her that I would contact Dr. Arnell Asal office and get their input. Advised her to wait until they heard back from someone before changing his dose.   I will send this message to his office.

## 2011-04-29 NOTE — Telephone Encounter (Signed)
Spoke with Nicole Cella. Instruct that Dr.Neijstrom agree's with instructions from Dr.Rourk's office. Pt is to take Coumadin 3 mg daily (1 and 1/2 tablet of 2 mg tablet strength). Pt is to keep his lab appointment with [place] for Friday and Monday. Dorothy verbalized understanding.

## 2011-04-29 NOTE — Telephone Encounter (Signed)
Message copied by Dennie Maizes on Wed Apr 29, 2011  2:54 PM ------      Message from: Mariel Sleet, ERIC S      Created: Wed Apr 29, 2011  1:28 PM       We just need to see his INR on Friday.      ----- Message -----         From: Sol Blazing, RN         Sent: 04/29/2011  12:33 PM           To: Randall An, MD            Please read note from Rourk's office about Coumadin and pt's response.      ----- Message -----         From: Evalee Mutton, LPN         Sent: 04/29/2011  12:29 PM           To: Valentina Shaggy Malan Werk, RN, Corbin Ade, MD, #            See hp rx instrucitons

## 2011-04-30 NOTE — Progress Notes (Signed)
Dr. Thornton Papas office spoke with pts family

## 2011-05-01 ENCOUNTER — Other Ambulatory Visit (HOSPITAL_COMMUNITY): Payer: Self-pay | Admitting: Oncology

## 2011-05-01 ENCOUNTER — Encounter (HOSPITAL_BASED_OUTPATIENT_CLINIC_OR_DEPARTMENT_OTHER): Payer: Medicaid Other

## 2011-05-01 DIAGNOSIS — I82409 Acute embolism and thrombosis of unspecified deep veins of unspecified lower extremity: Secondary | ICD-10-CM

## 2011-05-01 LAB — PROTIME-INR
INR: 1.37 (ref 0.00–1.49)
Prothrombin Time: 17.1 seconds — ABNORMAL HIGH (ref 11.6–15.2)

## 2011-05-01 NOTE — Progress Notes (Signed)
Labs drawn today for pt.  Patient on 4,5 and 5mg  alternating.  Call patient at [phone]

## 2011-05-04 ENCOUNTER — Other Ambulatory Visit (HOSPITAL_COMMUNITY): Payer: Self-pay | Admitting: Oncology

## 2011-05-04 ENCOUNTER — Encounter (HOSPITAL_BASED_OUTPATIENT_CLINIC_OR_DEPARTMENT_OTHER): Payer: Medicaid Other

## 2011-05-04 DIAGNOSIS — E611 Iron deficiency: Secondary | ICD-10-CM

## 2011-05-04 DIAGNOSIS — I82409 Acute embolism and thrombosis of unspecified deep veins of unspecified lower extremity: Secondary | ICD-10-CM

## 2011-05-04 LAB — CBC
HCT: 35.3 % — ABNORMAL LOW (ref 39.0–52.0)
Hemoglobin: 11 g/dL — ABNORMAL LOW (ref 13.0–17.0)
RBC: 4.19 MIL/uL — ABNORMAL LOW (ref 4.22–5.81)
WBC: 4.6 10*3/uL (ref 4.0–10.5)

## 2011-05-04 LAB — PROTIME-INR
INR: 1.37 (ref 0.00–1.49)
Prothrombin Time: 17.1 seconds — ABNORMAL HIGH (ref 11.6–15.2)

## 2011-05-04 LAB — FERRITIN: Ferritin: 178 ng/mL (ref 22–322)

## 2011-05-04 LAB — HOMOCYSTEINE: Homocysteine: 19.8 umol/L — ABNORMAL HIGH (ref 4.0–15.4)

## 2011-05-04 NOTE — Progress Notes (Signed)
Labs drawn today for pt, cbc,ferr,Homocysteine.  Patient on 4, 5 and 5mg  of coud alternating .   Call patient at [phone]

## 2011-05-06 ENCOUNTER — Other Ambulatory Visit (HOSPITAL_COMMUNITY): Payer: Medicaid Other

## 2011-05-06 ENCOUNTER — Ambulatory Visit (HOSPITAL_COMMUNITY): Payer: Medicaid Other | Admitting: Oncology

## 2011-05-07 ENCOUNTER — Telehealth (HOSPITAL_COMMUNITY): Payer: Self-pay | Admitting: *Deleted

## 2011-05-07 ENCOUNTER — Other Ambulatory Visit (HOSPITAL_COMMUNITY): Payer: Self-pay | Admitting: Oncology

## 2011-05-07 ENCOUNTER — Encounter (HOSPITAL_COMMUNITY): Payer: Medicaid Other

## 2011-05-07 ENCOUNTER — Other Ambulatory Visit (HOSPITAL_COMMUNITY): Payer: Medicaid Other

## 2011-05-07 DIAGNOSIS — I82409 Acute embolism and thrombosis of unspecified deep veins of unspecified lower extremity: Secondary | ICD-10-CM

## 2011-05-07 LAB — PROTIME-INR
INR: 1.49 (ref 0.00–1.49)
Prothrombin Time: 18.3 seconds — ABNORMAL HIGH (ref 11.6–15.2)

## 2011-05-07 NOTE — Progress Notes (Signed)
Labs drawn today for pt level and warfin level.  Call patient at [phone].  Patient stated he was on the same dose as last time.

## 2011-05-07 NOTE — Telephone Encounter (Signed)
Pt notified to take coumadin 6 mg daily and pt level in one week. Coumadin 1 mg tabs called to [facility] so that pt can make correct dose.

## 2011-05-12 ENCOUNTER — Encounter (HOSPITAL_BASED_OUTPATIENT_CLINIC_OR_DEPARTMENT_OTHER): Payer: Medicaid Other | Admitting: Oncology

## 2011-05-12 DIAGNOSIS — D509 Iron deficiency anemia, unspecified: Secondary | ICD-10-CM

## 2011-05-12 DIAGNOSIS — E611 Iron deficiency: Secondary | ICD-10-CM

## 2011-05-12 DIAGNOSIS — E7211 Homocystinuria: Secondary | ICD-10-CM

## 2011-05-12 DIAGNOSIS — I82409 Acute embolism and thrombosis of unspecified deep veins of unspecified lower extremity: Secondary | ICD-10-CM

## 2011-05-12 DIAGNOSIS — E7210 Disorders of sulfur-bearing amino-acid metabolism, unspecified: Secondary | ICD-10-CM

## 2011-05-12 NOTE — Patient Instructions (Signed)
[facility]  Discharge Instructions  RECOMMENDATIONS MADE BY THE CONSULTANT AND ANY TEST RESULTS WILL BE SENT TO YOUR REFERRING DOCTOR.   PT/INR today, we will call you with instructions for your Coumadin later today. Return to clinic in 6 weeks to see the doctor. Report any issues or concerns to this clinic as needed.   I acknowledge that I have been informed and understand all the instructions given to me and received a copy. I do not have any more questions at this time, but understand that I may call the Specialty Clinic at [facility] at [phone] during business hours should I have any further questions or need assistance in obtaining follow-up care.    __________________________________________  _____________  __________ Signature of Patient or Authorized Representative            Date                   Time    __________________________________________ Nurse's Signature

## 2011-05-12 NOTE — Progress Notes (Signed)
Justin Gilmore presented for [employer]. Labs per MD order drawn via Peripheral Line 23 gauge needle inserted in right antecubital.  Good blood return present. Procedure without incident.  Needle removed intact. Patient tolerated procedure well.  Coumadin 6 mg daily.

## 2011-05-12 NOTE — Progress Notes (Signed)
[name], MD, MD [address] [place] [address]  1. DVT (deep venous thrombosis)  CBC, Differential, Basic metabolic panel, Ferritin, Homocysteine, serum, Protime-INR  2. Iron deficiency  CBC, Ferritin  3. Hyperhomocysteinemia  Homocysteine, serum    CURRENT THERAPY: On coumadin 6 mg daily  INTERVAL HISTORY: Justin Gilmore 66 y.o. male returns for  regular  visit for followup of DVT, homocystinemia, and iron deficiency.  The patient reports that his right ankle is tender and swollen.  This is the same extremity with a DVT for which we are treating. He explains that it began following his colonoscopy.  We went over his pathology results from his EGD and Colonoscopy.  He was diagnosed with H. Pylori and is being treated for that.  He will complete treatment in 2 days.  I personally reviewed and went over laboratory results with the patient.  He reports that he is compliant with his Coumadin and therefore I suspect his INR has been low secondary to antibiotic treatment for H. Pylori.  He reports that he has cut back on his smoking.  He smokes 1 ppd, compared to 2 ppd.  He admits that he continues to drink approximately 1 six pack per week.  He explains that he only drinks on weekends.  Smoking cessation education provided.   ROS: No TIA's or unusual headaches, no dysphagia.  No prolonged cough. No dyspnea or chest pain on exertion.  No abdominal pain, change in bowel habits, black or bloody stools.  No urinary tract symptoms.  No new or unusual musculoskeletal symptoms.    Past Medical History  Diagnosis Date  . Hypertension   . Tobacco abuse   . DVT (deep venous thrombosis) 2012    Right lower ext.  Marland Kitchen GERD (gastroesophageal reflux disease)   . Diabetes mellitus   . Renal cyst   . Renal failure     Stage 2- Stage 3  . DVT (deep venous thrombosis) 11/11/2010  . Arterial insufficiency 11/11/2010  . Renal insufficiency 11/11/2010  . Hypertension 11/11/2010   . GERD (gastroesophageal reflux disease) 11/11/2010  . 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 ETOH abuse 12/10/2010  . Folic acid deficiency 12/10/2010    has DVT (deep venous thrombosis); Arterial insufficiency; Renal insufficiency; Hypertension; GERD (gastroesophageal reflux disease); Hyperhomocysteinemia; ETOH abuse; Folic acid deficiency; and Iron deficiency anemia on his problem list.      has no known allergies.  Mr. Dunnigan does not currently have medications on file.  Past Surgical History  Procedure Date  . 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 Leonides Sake  . Back surgery     Denies any headaches, dizziness, double vision, fevers, chills, night sweats, nausea, vomiting, diarrhea, constipation, chest pain, heart palpitations, shortness of breath, blood in stool, black tarry stool, urinary pain, urinary burning, urinary frequency, hematuria.   PHYSICAL EXAMINATION  ECOG PERFORMANCE STATUS: 1 - Symptomatic but completely ambulatory  Filed Vitals:   05/12/11 1022  BP: 123/71  Pulse: 53  Temp: 98.7 F (37.1 C)    GENERAL:alert, no distress and smiling SKIN: skin color, texture, turgor are normal, no rashes or significant lesions HEAD: Normocephalic, No masses, lesions, tenderness or abnormalities EYES: normal EARS: External ears normal OROPHARYNX:edentulous and mucous membranes are moist  NECK: supple, no adenopathy, thyroid normal size, non-tender, without nodularity, no stridor, non-tender, trachea midline LYMPH:  no palpable lymphadenopathy BREAST:not examined LUNGS: clear to auscultation and percussion, decreased breath  sounds HEART: regular rate & rhythm, no murmurs, no gallops, S1 normal and S2 normal ABDOMEN:abdomen soft, non-tender, normal bowel sounds, no masses or organomegaly and no hepatosplenomegaly BACK: Back symmetric, no curvature., No CVA tenderness EXTREMITIES:less then 2 second capillary refill, no  joint deformities, effusion, or inflammation, no skin discoloration, no clubbing, no cyanosis, positive findings:  edema right ankle edema 1+ pitting  NEURO: alert & oriented x 3 with fluent speech, no focal motor/sensory deficits, gait normal   LABORATORY DATA: CBC    Component Value Date/Time   WBC 4.6 05/04/2011 0949   RBC 4.19* 05/04/2011 0949   HGB 11.0* 05/04/2011 0949   HCT 35.3* 05/04/2011 0949   PLT 226 05/04/2011 0949   MCV 84.2 05/04/2011 0949   MCH 26.3 05/04/2011 0949   MCHC 31.2 05/04/2011 0949   RDW 24.4* 05/04/2011 0949   LYMPHSABS 1.3 03/11/2011 1039   MONOABS 0.3 03/11/2011 1039   EOSABS 0.1 03/11/2011 1039   BASOSABS 0.0 03/11/2011 1039      Chemistry      Component Value Date/Time   NA 141 03/18/2011 0855   K 4.3 03/18/2011 0855   CL 108 03/18/2011 0855   CO2 22 03/18/2011 0855   BUN 22 03/18/2011 0855   CREATININE 1.88* 03/18/2011 0855      Component Value Date/Time   CALCIUM 9.7 03/18/2011 0855   ALKPHOS 79 01/09/2011 0933   AST 17 01/09/2011 0933   ALT 9 01/09/2011 0933   BILITOT 0.1* 01/09/2011 0933     Lab Results  Component Value Date   INR 2.50* 05/12/2011   INR 1.49 05/07/2011   INR 1.37 05/04/2011      PATHOLOGY: 04/22/2011  FINAL DIAGNOSIS Diagnosis 1. Colon, polyp(s), splenic flexure - TUBULAR ADENOMA. - HIGH GRADE DYSPLASIA IS NOT IDENTIFIED. 2. Colon, polyp(s), sigmoid - HYPERPLASTIC POLYP. - THERE IS NO EVIDENCE OF MALIGNANCY. 3. 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 - THERE IS NO EVIDENCE OF DYSPLASIA OR MALIGNANCY. - SEE COMMENT. 4. Duodenum, NOS biopsy - PEPTIC DUODENITIS. - THERE IS NO EVIDENCE OF SIGNIFICANT VILLOUS ATROPHY, DYSPLASIA, OR MALIGNANCY. Microscopic Comment 3. A Warthin-Starry stain highlights the presence of Helicobacter pylori organisms. (JBK:eps 04/23/11) Pecola Leisure MD Pathologist, Electronic Signature (Case signed 04/23/2011)    ASSESSMENT:  1. Deep vein thrombosis  of the right leg.  2. Right LE swelling and discomfort. 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  3. Folic acid deficiency at presentation.  4. Iron deficiency at presentation.  5. Chronic renal insufficiency at presentation.  6. History of tobacco abuse in the past.  7. History of hypertension.   PLAN:  1. Lab work today: PT/INR 2. Lab work in 6 weeks: CBC diff, CMET, Ferritin, Homocysteine level. 3. I personally reviewed and went over laboratory results with the patient. 4. I suspect the patient's INR is decreased secondary to antibiotic usage for H. Pylori infection. 5. Continue with 6 mg of Coumadin daily 6. Patient encouraged to keep right LE elevated and wear his compression stocking.  He may take Tylenol for pain if needed.  He will let us know how his ankle feels in one week. 7. Return in 6 weeks for follow-up.  All questions were answered. The patient knows to call the clinic with any problems, questions or concerns. We can certainly see the patient much sooner if necessary.  The patient and plan discussed with Glenford Peers, MD and he is in agreement  with the aforementioned.  Alizey Noren

## 2011-05-15 ENCOUNTER — Telehealth (HOSPITAL_COMMUNITY): Payer: Self-pay | Admitting: *Deleted

## 2011-05-15 ENCOUNTER — Encounter (HOSPITAL_COMMUNITY): Payer: Medicaid Other | Attending: Oncology

## 2011-05-15 ENCOUNTER — Other Ambulatory Visit (HOSPITAL_COMMUNITY): Payer: Self-pay | Admitting: Oncology

## 2011-05-15 DIAGNOSIS — I82409 Acute embolism and thrombosis of unspecified deep veins of unspecified lower extremity: Secondary | ICD-10-CM

## 2011-05-15 LAB — PROTIME-INR: INR: 2.45 — ABNORMAL HIGH (ref 0.00–1.49)

## 2011-05-15 NOTE — Telephone Encounter (Signed)
Message copied by Dennie Maizes on Fri May 15, 2011 12:51 PM ------      Message from: Ellouise Newer III      Created: Fri May 15, 2011 12:35 PM       Randie Heinz!            Same dose            PT/INR in 1 week

## 2011-05-15 NOTE — Telephone Encounter (Signed)
Left message on answering machine as below. Instructions for Coumadin and appt information left. Instruct to call clinic with any questions.

## 2011-05-20 NOTE — Progress Notes (Signed)
Lab draw

## 2011-05-22 ENCOUNTER — Encounter (HOSPITAL_BASED_OUTPATIENT_CLINIC_OR_DEPARTMENT_OTHER): Payer: Medicaid Other

## 2011-05-22 DIAGNOSIS — I82409 Acute embolism and thrombosis of unspecified deep veins of unspecified lower extremity: Secondary | ICD-10-CM

## 2011-05-22 LAB — PROTIME-INR
INR: 4.13 — ABNORMAL HIGH (ref 0.00–1.49)
Prothrombin Time: 40.6 seconds — ABNORMAL HIGH (ref 11.6–15.2)

## 2011-05-25 ENCOUNTER — Other Ambulatory Visit (HOSPITAL_COMMUNITY): Payer: Self-pay

## 2011-05-25 DIAGNOSIS — I82409 Acute embolism and thrombosis of unspecified deep veins of unspecified lower extremity: Secondary | ICD-10-CM

## 2011-05-27 ENCOUNTER — Encounter (HOSPITAL_COMMUNITY): Payer: Medicaid Other

## 2011-06-05 ENCOUNTER — Encounter (HOSPITAL_BASED_OUTPATIENT_CLINIC_OR_DEPARTMENT_OTHER): Payer: Medicaid Other

## 2011-06-05 ENCOUNTER — Other Ambulatory Visit (HOSPITAL_COMMUNITY): Payer: Self-pay | Admitting: Oncology

## 2011-06-05 DIAGNOSIS — I82409 Acute embolism and thrombosis of unspecified deep veins of unspecified lower extremity: Secondary | ICD-10-CM

## 2011-06-05 LAB — PROTIME-INR: INR: 7.83 (ref 0.00–1.49)

## 2011-06-05 NOTE — Progress Notes (Signed)
Labs drawn today for pt. Patient stated he is on same dose.  He did not know the mg amount.  Call patient at [phone]

## 2011-06-08 ENCOUNTER — Telehealth (HOSPITAL_COMMUNITY): Payer: Self-pay | Admitting: *Deleted

## 2011-06-08 ENCOUNTER — Encounter (HOSPITAL_BASED_OUTPATIENT_CLINIC_OR_DEPARTMENT_OTHER): Payer: Medicaid Other

## 2011-06-08 ENCOUNTER — Other Ambulatory Visit (HOSPITAL_COMMUNITY): Payer: Self-pay | Admitting: Oncology

## 2011-06-08 DIAGNOSIS — I82409 Acute embolism and thrombosis of unspecified deep veins of unspecified lower extremity: Secondary | ICD-10-CM

## 2011-06-08 NOTE — Progress Notes (Signed)
Labs drawn today for pt.  Patient has not taken coud for 3 days.  Patient stated on chantix.  Call patient at [phone]

## 2011-06-08 NOTE — Telephone Encounter (Signed)
Spoke with pt's daughter Nicole Cella. Instruct pt to take Coumadin 5 mg daily. Recheck pt/inr on Thursday. Verbalized understanding.

## 2011-06-08 NOTE — Telephone Encounter (Signed)
Message copied by Dennie Maizes on Mon Jun 08, 2011  2:36 PM ------      Message from: Ellouise Newer III      Created: Mon Jun 08, 2011  1:56 PM       Find out what dose he used to be on.  We had to hold him over the weekend and give him Vit K.

## 2011-06-08 NOTE — Telephone Encounter (Signed)
Message copied by Dennie Maizes on Mon Jun 08, 2011  3:36 PM ------      Message from: Ellouise Newer III      Created: Mon Jun 08, 2011  2:41 PM       Change Coumadin to 5 mg daily            PT/INR on Friday

## 2011-06-11 ENCOUNTER — Other Ambulatory Visit (HOSPITAL_COMMUNITY): Payer: Self-pay | Admitting: Oncology

## 2011-06-11 ENCOUNTER — Telehealth (HOSPITAL_COMMUNITY): Payer: Self-pay | Admitting: *Deleted

## 2011-06-11 ENCOUNTER — Encounter (HOSPITAL_COMMUNITY): Payer: Medicaid Other

## 2011-06-11 DIAGNOSIS — I82409 Acute embolism and thrombosis of unspecified deep veins of unspecified lower extremity: Secondary | ICD-10-CM

## 2011-06-11 LAB — PROTIME-INR: INR: 1.72 — ABNORMAL HIGH (ref 0.00–1.49)

## 2011-06-11 NOTE — Telephone Encounter (Signed)
Message copied by Dennie Maizes on Thu Jun 11, 2011 12:59 PM ------      Message from: Ellouise Newer III      Created: Thu Jun 11, 2011 11:32 AM       Increase to 5 / 6 mg alternating            PT/INR in 1 week

## 2011-06-11 NOTE — Telephone Encounter (Signed)
Spoke with pt and his caregiver. Instruct to increase Coumadin to 5 mg alternating with 6 mg every other day. Lab work due 4/9. Verbalized understanding.

## 2011-06-18 ENCOUNTER — Encounter (HOSPITAL_COMMUNITY): Payer: Medicaid Other | Attending: Oncology

## 2011-06-18 ENCOUNTER — Other Ambulatory Visit (HOSPITAL_COMMUNITY): Payer: Self-pay | Admitting: Oncology

## 2011-06-18 DIAGNOSIS — E7211 Homocystinuria: Secondary | ICD-10-CM

## 2011-06-18 DIAGNOSIS — R799 Abnormal finding of blood chemistry, unspecified: Secondary | ICD-10-CM | POA: Insufficient documentation

## 2011-06-18 DIAGNOSIS — E611 Iron deficiency: Secondary | ICD-10-CM

## 2011-06-18 DIAGNOSIS — I82409 Acute embolism and thrombosis of unspecified deep veins of unspecified lower extremity: Secondary | ICD-10-CM

## 2011-06-18 DIAGNOSIS — E7210 Disorders of sulfur-bearing amino-acid metabolism, unspecified: Secondary | ICD-10-CM | POA: Insufficient documentation

## 2011-06-18 LAB — BASIC METABOLIC PANEL
CO2: 24 mEq/L (ref 19–32)
Calcium: 9.9 mg/dL (ref 8.4–10.5)
Chloride: 96 mEq/L (ref 96–112)
GFR calc non Af Amer: 35 mL/min — ABNORMAL LOW (ref >90)

## 2011-06-18 LAB — DIFFERENTIAL
Basophils Relative: 1 % (ref 0–1)
Lymphs Abs: 1 10*3/uL (ref 0.7–4.0)
Monocytes Absolute: 0.5 10*3/uL (ref 0.1–1.0)
Monocytes Relative: 16 % — ABNORMAL HIGH (ref 3–12)
Neutro Abs: 1.7 10*3/uL (ref 1.7–7.7)

## 2011-06-18 LAB — FERRITIN: Ferritin: 58 ng/mL (ref 22–322)

## 2011-06-18 LAB — PROTIME-INR: Prothrombin Time: 22.1 seconds — ABNORMAL HIGH (ref 11.6–15.2)

## 2011-06-18 LAB — CBC
HCT: 39.3 % (ref 39.0–52.0)
Hemoglobin: 12.8 g/dL — ABNORMAL LOW (ref 13.0–17.0)
MCH: 27.7 pg (ref 26.0–34.0)
MCHC: 32.6 g/dL (ref 30.0–36.0)

## 2011-06-19 LAB — HOMOCYSTEINE: Homocysteine: 23.2 umol/L — ABNORMAL HIGH (ref 4.0–15.4)

## 2011-06-19 NOTE — Progress Notes (Signed)
Lab draw

## 2011-06-23 ENCOUNTER — Other Ambulatory Visit (HOSPITAL_COMMUNITY): Payer: Medicaid Other

## 2011-06-24 ENCOUNTER — Encounter (HOSPITAL_COMMUNITY): Payer: Self-pay | Admitting: Oncology

## 2011-06-24 ENCOUNTER — Encounter (HOSPITAL_BASED_OUTPATIENT_CLINIC_OR_DEPARTMENT_OTHER): Payer: Medicaid Other | Admitting: Oncology

## 2011-06-24 VITALS — BP 119/73 | HR 44 | Temp 98.3°F | Wt 184.1 lb

## 2011-06-24 DIAGNOSIS — R7989 Other specified abnormal findings of blood chemistry: Secondary | ICD-10-CM

## 2011-06-24 DIAGNOSIS — E7211 Homocystinuria: Secondary | ICD-10-CM

## 2011-06-24 DIAGNOSIS — R799 Abnormal finding of blood chemistry, unspecified: Secondary | ICD-10-CM

## 2011-06-24 DIAGNOSIS — N289 Disorder of kidney and ureter, unspecified: Secondary | ICD-10-CM

## 2011-06-24 DIAGNOSIS — I82409 Acute embolism and thrombosis of unspecified deep veins of unspecified lower extremity: Secondary | ICD-10-CM

## 2011-06-24 DIAGNOSIS — F17200 Nicotine dependence, unspecified, uncomplicated: Secondary | ICD-10-CM

## 2011-06-24 NOTE — Patient Instructions (Addendum)
Justin Gilmore  [id number] 1946/03/08   [facility]  Discharge Instructions  RECOMMENDATIONS MADE BY THE CONSULTANT AND ANY TEST RESULTS WILL BE SENT TO YOUR REFERRING DOCTOR.   EXAM FINDINGS BY MD TODAY AND SIGNS AND SYMPTOMS TO REPORT TO CLINIC OR PRIMARY MD: Exam findings as discussed by T. Kefalas, PA-C.  INSTRUCTIONS GIVEN AND DISCUSSED: 1.  Continue taking your folic acid as prescribed 2.  Return to see Dr. Mariel Sleet in 3 months 3.  Continue to return for labs as planned, monthly labs in addition to your PT/INRs 4.  You have an appointment to see Dr. Kristian Covey on 07/06/11 @ 11:15  I acknowledge that I have been informed and understand all the instructions given to me and received a copy. I do not have any more questions at this time, but understand that I may call the Specialty Clinic at [facility] at [phone] during business hours should I have any further questions or need assistance in obtaining follow-up care.    __________________________________________  _____________  __________ Signature of Patient or Authorized Representative            Date                   Time    __________________________________________ Nurse's Signature

## 2011-06-24 NOTE — Progress Notes (Signed)
Justin Obey, MD, Justin Gilmore [address] [place] [address]  1. DVT (deep venous thrombosis)  CBC, Basic metabolic panel, Differential, Homocysteine, serum, Ambulatory referral to Nephrology, CBC, Basic metabolic panel, Homocysteine, serum, CBC, Differential, Comprehensive metabolic panel, Ferritin  2. Hyperhomocysteinemia  Homocysteine, serum, Homocysteine, serum  3. Creatinine elevation  Ambulatory referral to Nephrology    CURRENT THERAPY: On Coumadin 6 mg alternating with 5 mg.  INTERVAL HISTORY: Justin Gilmore 66 y.o. male returns for  regular  visit for followup of DVT, homocystinemia, and iron deficiency.   I personally reviewed and went over laboratory results with the patient.  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 His INR is nearly within the therapeutic range.  We will ascertain a PT/INR today.  We discussed his renal insufficieny.  He is an established patient with Dr. Emelia Gilmore (Nephrology) and so we will fax results to nephrologist and allow him to evaluate his renal function.   The patient is on Chantix.  He has decreased his tobacco intake from 2 ppd to 1/2 ppd.  I congratulated him on this achievement.  I encouraged him to continue decreasing his tobacco intake until he is able to quit completely.  He reports that the tobacco taste is worsening secondary to the Chantix.  We discussed his drinking habits again.  He consumes 2-3 shot of rum on Friday/Saturday/Sunday and 10 cans of beer over the weekend.  He is not too forthcoming with this information and I believe he likely consumes more EtOH than he admits and reports today.   His HR today is 46.  He is on metoprolol, norvasc, and HCTZ.  I have encouraged the patient to follow-up with his PCP who is the prescribing physician about his bradycardia.  He may be on too much metoprolol.  I will defer this to his PCP, Dr. Sudie Gilmore.  He denies any syncope, chest pain, history of MI, dizziness, nausea,  heart palpitations, and shortness of breath.   He is doing well.  He denies any complaints. ROS: No TIA's or unusual headaches, no dysphagia.  No prolonged cough. No dyspnea or chest pain on exertion.  No abdominal pain, change in bowel habits, black or bloody stools.  No urinary tract symptoms.  No new or unusual musculoskeletal symptoms.    Past Medical History  Diagnosis Date  . Hypertension   . Tobacco abuse   . DVT (deep venous thrombosis) 2012    Right lower ext.  Marland Kitchen GERD (gastroesophageal reflux disease)   . Renal cyst   . Renal failure     Stage 2- Stage 3  . DVT (deep venous thrombosis) 11/11/2010  . Arterial insufficiency 11/11/2010  . Renal insufficiency 11/11/2010  . Hypertension 11/11/2010  . GERD (gastroesophageal reflux disease) 11/11/2010  . 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 ETOH abuse 12/10/2010  . Folic acid deficiency 12/10/2010    has DVT (deep venous thrombosis); Arterial insufficiency; Renal insufficiency; Hypertension; GERD (gastroesophageal reflux disease); Hyperhomocysteinemia; ETOH abuse; Folic acid deficiency; and Iron deficiency anemia on his problem list.      has no known allergies.  Mr. Lesser does not currently have medications on file.  Past Surgical History  Procedure Date  . 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 Leonides Gilmore  . Back surgery     Denies any headaches, dizziness, double vision, fevers, chills, night sweats, nausea, vomiting, diarrhea, constipation, chest pain, heart palpitations, shortness of breath, blood in stool,  black tarry stool, urinary pain, urinary burning, urinary frequency, hematuria.   PHYSICAL EXAMINATION  ECOG PERFORMANCE STATUS: 1 - Symptomatic but completely ambulatory  Filed Vitals:   06/24/11 1249  BP: 119/73  Pulse: 44  Temp: 98.3 F (36.8 C)    GENERAL:alert, no distress, well nourished, well developed, comfortable, cooperative and smiling SKIN: skin  color, texture, turgor are normal, no rashes or significant lesions HEAD: Normocephalic, No masses, lesions, tenderness or abnormalities EYES: normal, Conjunctiva are pink and non-injected EARS: External ears normal OROPHARYNX:lips, buccal mucosa, and tongue normal, edentulous and mucous membranes are moist  NECK: supple, trachea midline LYMPH:  not examined BREAST:not examined LUNGS: clear to auscultation and percussion HEART: regular rate & rhythm, no murmurs, no gallops, S1 normal and S2 normal.  HR 46 apically via auscultation.  ABDOMEN:abdomen soft, non-tender and normal bowel sounds BACK: Back symmetric, no curvature. EXTREMITIES:less then 2 second capillary refill, no joint deformities, effusion, or inflammation, no skin discoloration, no cyanosis, positive findings:  Clubbing appreciated and 1+ pre-tibial pitting edema in Left LE.  NEURO: alert & oriented x 3 with fluent speech, no focal motor/sensory deficits, gait normal   LABORATORY DATA: CBC    Component Value Date/Time   WBC 3.3* 06/18/2011 0917   RBC 4.62 06/18/2011 0917   HGB 12.8* 06/18/2011 0917   HCT 39.3 06/18/2011 0917   PLT 170 06/18/2011 0917   MCV 85.1 06/18/2011 0917   MCH 27.7 06/18/2011 0917   MCHC 32.6 06/18/2011 0917   RDW 19.7* 06/18/2011 0917   LYMPHSABS 1.0 06/18/2011 0917   MONOABS 0.5 06/18/2011 0917   EOSABS 0.1 06/18/2011 0917   BASOSABS 0.0 06/18/2011 0917      Chemistry      Component Value Date/Time   NA 130* 06/18/2011 0917   K 4.1 06/18/2011 0917   CL 96 06/18/2011 0917   CO2 24 06/18/2011 0917   BUN 16 06/18/2011 0917   CREATININE 1.95* 06/18/2011 0917      Component Value Date/Time   CALCIUM 9.9 06/18/2011 0917   ALKPHOS 79 01/09/2011 0933   AST 17 01/09/2011 0933   ALT 9 01/09/2011 0933   BILITOT 0.1* 01/09/2011 0933     Lab Results  Component Value Date   INR 1.90* 06/18/2011   INR 1.72* 06/11/2011   INR 2.38* 06/08/2011      ASSESSMENT: 1. Deep vein thrombosis of the right leg.  2. Right LE swelling and  discomfort.  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  3. Folic acid deficiency at presentation.  4. Iron deficiency at presentation.  5. Chronic renal insufficiency at presentation.  6. Tobacco abuse, down to 1/2 ppd compared to 2 ppd.  On Chantix. 7. History of hypertension. 8. EtOH abuse, approximately 6-9 shots of rum per week with 10-12 cans of beer per week, maybe more.   PLAN:  1. PT/INR today 2. Cancel PT/INR tomorrow.  3. I personally reviewed and went over laboratory results with the patient. 4. Continue Chantix. 5. Continue Folate daily 6. Follow-up with Nephrologist for renal insufficiency and worsening Creatinine.  7. Fax recent lab results to Dr. Emelia Gilmore. 8. Follow-up with PCP regarding Bradycardia.  He is asymptomatic presently and is taking metoprolol 100 mg daily.   9. Lab work PRN: PT/INR 10. Lab work monthly: CBC diff, CMET, homocystinemia with a ferritin in 3 months.  11. Return in 3 months for follow-up.  Will continue Coumadin and PT/INR monitoring.  All questions were answered. The patient knows to call the clinic with any problems, questions or concerns. We can certainly see the patient much sooner if necessary.  The patient and plan discussed with Justin Peers, Justin Gilmore and he is in agreement with the aforementioned.  Dezmond Downie

## 2011-06-24 NOTE — Progress Notes (Signed)
Labs drawn

## 2011-06-25 ENCOUNTER — Other Ambulatory Visit (HOSPITAL_COMMUNITY): Payer: Medicaid Other

## 2011-07-01 ENCOUNTER — Other Ambulatory Visit (HOSPITAL_COMMUNITY): Payer: Self-pay | Admitting: Oncology

## 2011-07-01 ENCOUNTER — Encounter (HOSPITAL_BASED_OUTPATIENT_CLINIC_OR_DEPARTMENT_OTHER): Payer: Medicaid Other

## 2011-07-01 DIAGNOSIS — I82409 Acute embolism and thrombosis of unspecified deep veins of unspecified lower extremity: Secondary | ICD-10-CM

## 2011-07-01 NOTE — Progress Notes (Signed)
Pt/inr today Pt notified to continue same dose coumadin and pt/inr on Monday.

## 2011-07-03 NOTE — Progress Notes (Signed)
Labs drawn

## 2011-07-06 ENCOUNTER — Other Ambulatory Visit (HOSPITAL_COMMUNITY): Payer: Self-pay | Admitting: Oncology

## 2011-07-06 ENCOUNTER — Encounter (HOSPITAL_BASED_OUTPATIENT_CLINIC_OR_DEPARTMENT_OTHER): Payer: Medicaid Other

## 2011-07-06 DIAGNOSIS — I82409 Acute embolism and thrombosis of unspecified deep veins of unspecified lower extremity: Secondary | ICD-10-CM

## 2011-07-06 LAB — PROTIME-INR
INR: 2.9 — ABNORMAL HIGH (ref 0.00–1.49)
Prothrombin Time: 30.8 seconds — ABNORMAL HIGH (ref 11.6–15.2)

## 2011-07-06 NOTE — Progress Notes (Signed)
Labs drawn today for pt.  Patient on 6mg  of coud.  He thinks that is the dose.  Call patient at [phone]

## 2011-07-13 ENCOUNTER — Other Ambulatory Visit (HOSPITAL_COMMUNITY): Payer: Medicaid Other

## 2011-07-14 ENCOUNTER — Encounter (HOSPITAL_BASED_OUTPATIENT_CLINIC_OR_DEPARTMENT_OTHER): Payer: Medicaid Other

## 2011-07-14 ENCOUNTER — Other Ambulatory Visit (HOSPITAL_COMMUNITY): Payer: Self-pay | Admitting: Oncology

## 2011-07-14 DIAGNOSIS — I82409 Acute embolism and thrombosis of unspecified deep veins of unspecified lower extremity: Secondary | ICD-10-CM

## 2011-07-14 LAB — PROTIME-INR: INR: 4.96 — ABNORMAL HIGH (ref 0.00–1.49)

## 2011-07-14 NOTE — Progress Notes (Signed)
Pt had labs today and called with instructions

## 2011-07-16 ENCOUNTER — Other Ambulatory Visit (HOSPITAL_COMMUNITY): Payer: Self-pay | Admitting: Oncology

## 2011-07-16 ENCOUNTER — Encounter (HOSPITAL_COMMUNITY): Payer: Medicare Other | Attending: Oncology

## 2011-07-16 DIAGNOSIS — I82409 Acute embolism and thrombosis of unspecified deep veins of unspecified lower extremity: Secondary | ICD-10-CM

## 2011-07-16 DIAGNOSIS — E7210 Disorders of sulfur-bearing amino-acid metabolism, unspecified: Secondary | ICD-10-CM | POA: Insufficient documentation

## 2011-07-16 LAB — PROTIME-INR: Prothrombin Time: 33.5 seconds — ABNORMAL HIGH (ref 11.6–15.2)

## 2011-07-16 NOTE — Progress Notes (Signed)
Labs today

## 2011-07-23 ENCOUNTER — Other Ambulatory Visit (HOSPITAL_COMMUNITY): Payer: Self-pay | Admitting: Oncology

## 2011-07-23 ENCOUNTER — Encounter (HOSPITAL_COMMUNITY): Payer: Medicare Other

## 2011-07-23 DIAGNOSIS — E7211 Homocystinuria: Secondary | ICD-10-CM

## 2011-07-23 DIAGNOSIS — I82409 Acute embolism and thrombosis of unspecified deep veins of unspecified lower extremity: Secondary | ICD-10-CM

## 2011-07-23 LAB — BASIC METABOLIC PANEL
BUN: 14 mg/dL (ref 6–23)
Calcium: 9.5 mg/dL (ref 8.4–10.5)
Creatinine, Ser: 1.52 mg/dL — ABNORMAL HIGH (ref 0.50–1.35)
GFR calc non Af Amer: 46 mL/min — ABNORMAL LOW (ref >90)
Glucose, Bld: 121 mg/dL — ABNORMAL HIGH (ref 70–99)

## 2011-07-23 LAB — DIFFERENTIAL
Basophils Absolute: 0 10*3/uL (ref 0.0–0.1)
Basophils Relative: 1 % (ref 0–1)
Eosinophils Absolute: 0.1 10*3/uL (ref 0.0–0.7)
Eosinophils Relative: 2 % (ref 0–5)
Lymphocytes Relative: 31 % (ref 12–46)
Monocytes Absolute: 0.3 10*3/uL (ref 0.1–1.0)

## 2011-07-23 LAB — CBC
HCT: 38.4 % — ABNORMAL LOW (ref 39.0–52.0)
Hemoglobin: 12.7 g/dL — ABNORMAL LOW (ref 13.0–17.0)
MCH: 28.3 pg (ref 26.0–34.0)
MCHC: 33.1 g/dL (ref 30.0–36.0)
MCV: 85.7 fL (ref 78.0–100.0)

## 2011-07-24 ENCOUNTER — Other Ambulatory Visit (HOSPITAL_COMMUNITY): Payer: Medicaid Other

## 2011-07-27 ENCOUNTER — Other Ambulatory Visit (HOSPITAL_COMMUNITY): Payer: Self-pay | Admitting: Oncology

## 2011-07-27 ENCOUNTER — Encounter (HOSPITAL_COMMUNITY): Payer: Medicare Other

## 2011-07-27 ENCOUNTER — Telehealth (HOSPITAL_COMMUNITY): Payer: Self-pay | Admitting: *Deleted

## 2011-07-27 DIAGNOSIS — I82409 Acute embolism and thrombosis of unspecified deep veins of unspecified lower extremity: Secondary | ICD-10-CM

## 2011-07-27 LAB — PROTIME-INR: Prothrombin Time: 44.1 seconds — ABNORMAL HIGH (ref 11.6–15.2)

## 2011-07-27 NOTE — Telephone Encounter (Signed)
test

## 2011-07-29 ENCOUNTER — Encounter (HOSPITAL_COMMUNITY): Payer: Medicare Other

## 2011-07-29 ENCOUNTER — Telehealth (HOSPITAL_COMMUNITY): Payer: Self-pay | Admitting: *Deleted

## 2011-07-29 ENCOUNTER — Other Ambulatory Visit (HOSPITAL_COMMUNITY): Payer: Self-pay | Admitting: Oncology

## 2011-07-29 DIAGNOSIS — I82409 Acute embolism and thrombosis of unspecified deep veins of unspecified lower extremity: Secondary | ICD-10-CM

## 2011-07-29 LAB — PROTIME-INR: INR: 2.64 — ABNORMAL HIGH (ref 0.00–1.49)

## 2011-07-29 NOTE — Telephone Encounter (Signed)
Pt notified to take coumadin 4 mg daily and pt level on Monday 5/20

## 2011-07-29 NOTE — Progress Notes (Signed)
Labs drawn today for pt.  Patient on 5mg  of coud.  Call patient at [phone]

## 2011-08-03 ENCOUNTER — Encounter (HOSPITAL_BASED_OUTPATIENT_CLINIC_OR_DEPARTMENT_OTHER): Payer: Medicare Other

## 2011-08-03 ENCOUNTER — Other Ambulatory Visit (HOSPITAL_COMMUNITY): Payer: Self-pay | Admitting: Oncology

## 2011-08-03 DIAGNOSIS — I82409 Acute embolism and thrombosis of unspecified deep veins of unspecified lower extremity: Secondary | ICD-10-CM

## 2011-08-03 LAB — PROTIME-INR: Prothrombin Time: 32.6 seconds — ABNORMAL HIGH (ref 11.6–15.2)

## 2011-08-03 NOTE — Progress Notes (Signed)
Labs drawn today for pt.  Patient on 4mg  he thinks a day.  Call patient at [phone]

## 2011-08-07 ENCOUNTER — Other Ambulatory Visit (HOSPITAL_COMMUNITY): Payer: Self-pay | Admitting: Oncology

## 2011-08-07 ENCOUNTER — Encounter (HOSPITAL_COMMUNITY): Payer: Medicare Other

## 2011-08-07 ENCOUNTER — Telehealth (HOSPITAL_COMMUNITY): Payer: Self-pay

## 2011-08-07 DIAGNOSIS — I82409 Acute embolism and thrombosis of unspecified deep veins of unspecified lower extremity: Secondary | ICD-10-CM

## 2011-08-14 ENCOUNTER — Other Ambulatory Visit (HOSPITAL_COMMUNITY): Payer: Medicaid Other

## 2011-08-17 ENCOUNTER — Other Ambulatory Visit (HOSPITAL_COMMUNITY): Payer: Self-pay | Admitting: Oncology

## 2011-08-17 ENCOUNTER — Telehealth (HOSPITAL_COMMUNITY): Payer: Self-pay | Admitting: *Deleted

## 2011-08-24 ENCOUNTER — Other Ambulatory Visit (HOSPITAL_COMMUNITY): Payer: Medicaid Other

## 2011-08-25 ENCOUNTER — Other Ambulatory Visit (HOSPITAL_COMMUNITY): Payer: Self-pay | Admitting: Oncology

## 2011-08-25 ENCOUNTER — Telehealth (HOSPITAL_COMMUNITY): Payer: Self-pay | Admitting: *Deleted

## 2011-08-25 ENCOUNTER — Encounter (HOSPITAL_COMMUNITY): Payer: Medicare Other | Attending: Oncology

## 2011-08-25 DIAGNOSIS — E7211 Homocystinuria: Secondary | ICD-10-CM

## 2011-08-25 DIAGNOSIS — I82409 Acute embolism and thrombosis of unspecified deep veins of unspecified lower extremity: Secondary | ICD-10-CM

## 2011-08-25 DIAGNOSIS — E7210 Disorders of sulfur-bearing amino-acid metabolism, unspecified: Secondary | ICD-10-CM | POA: Insufficient documentation

## 2011-08-25 LAB — BASIC METABOLIC PANEL
BUN: 12 mg/dL (ref 6–23)
Creatinine, Ser: 1.46 mg/dL — ABNORMAL HIGH (ref 0.50–1.35)
GFR calc Af Amer: 56 mL/min — ABNORMAL LOW (ref >90)
GFR calc non Af Amer: 49 mL/min — ABNORMAL LOW (ref >90)
Glucose, Bld: 96 mg/dL (ref 70–99)

## 2011-08-25 LAB — CBC
HCT: 37 % — ABNORMAL LOW (ref 39.0–52.0)
Hemoglobin: 12.4 g/dL — ABNORMAL LOW (ref 13.0–17.0)
MCHC: 33.5 g/dL (ref 30.0–36.0)
MCV: 84.5 fL (ref 78.0–100.0)
RDW: 15.7 % — ABNORMAL HIGH (ref 11.5–15.5)

## 2011-08-25 LAB — PROTIME-INR: INR: 2.18 — ABNORMAL HIGH (ref 0.00–1.49)

## 2011-08-25 NOTE — Telephone Encounter (Signed)
Great. PT/INR in 2 weeks per Jenita Seashore PA. Patient notified to continue same dose of coumadin and lab in 2 weeks.

## 2011-09-08 ENCOUNTER — Other Ambulatory Visit (HOSPITAL_COMMUNITY): Payer: Self-pay | Admitting: Oncology

## 2011-09-08 ENCOUNTER — Encounter (HOSPITAL_BASED_OUTPATIENT_CLINIC_OR_DEPARTMENT_OTHER): Payer: Medicare Other

## 2011-09-08 DIAGNOSIS — I82409 Acute embolism and thrombosis of unspecified deep veins of unspecified lower extremity: Secondary | ICD-10-CM

## 2011-09-11 ENCOUNTER — Other Ambulatory Visit (HOSPITAL_COMMUNITY): Payer: Self-pay | Admitting: Oncology

## 2011-09-11 ENCOUNTER — Encounter (HOSPITAL_BASED_OUTPATIENT_CLINIC_OR_DEPARTMENT_OTHER): Payer: Medicare Other

## 2011-09-11 DIAGNOSIS — I82409 Acute embolism and thrombosis of unspecified deep veins of unspecified lower extremity: Secondary | ICD-10-CM

## 2011-09-11 LAB — PROTIME-INR
INR: 1.54 — ABNORMAL HIGH (ref 0.00–1.49)
Prothrombin Time: 18.8 seconds — ABNORMAL HIGH (ref 11.6–15.2)

## 2011-09-11 NOTE — Progress Notes (Signed)
Justin Gilmore presented for [employer]. Labs per MD order drawn via Peripheral Line 25 gauge needle inserted in rt ac.  Good blood return present. Procedure without incident.  Heplock flushed per protocol and remains intact. Patient tolerated procedure well. On coumadin 4 mg daily

## 2011-09-18 ENCOUNTER — Other Ambulatory Visit (HOSPITAL_COMMUNITY): Payer: Self-pay | Admitting: *Deleted

## 2011-09-18 ENCOUNTER — Encounter (HOSPITAL_COMMUNITY): Payer: Medicare Other | Attending: Oncology

## 2011-09-18 DIAGNOSIS — I82409 Acute embolism and thrombosis of unspecified deep veins of unspecified lower extremity: Secondary | ICD-10-CM | POA: Insufficient documentation

## 2011-09-18 NOTE — Progress Notes (Signed)
Coumadin 4mg  daily. Continue same dose. Return next Tuesday with other labs to have inr done.

## 2011-09-22 ENCOUNTER — Encounter (HOSPITAL_COMMUNITY): Payer: Medicare Other

## 2011-09-22 DIAGNOSIS — I82409 Acute embolism and thrombosis of unspecified deep veins of unspecified lower extremity: Secondary | ICD-10-CM

## 2011-09-22 LAB — CBC
MCH: 29.1 pg (ref 26.0–34.0)
MCV: 87.4 fL (ref 78.0–100.0)
Platelets: 188 10*3/uL (ref 150–400)
RDW: 16.9 % — ABNORMAL HIGH (ref 11.5–15.5)
WBC: 3.9 10*3/uL — ABNORMAL LOW (ref 4.0–10.5)

## 2011-09-22 LAB — PROTIME-INR: INR: 2.13 — ABNORMAL HIGH (ref 0.00–1.49)

## 2011-09-22 LAB — DIFFERENTIAL
Basophils Absolute: 0 10*3/uL (ref 0.0–0.1)
Eosinophils Absolute: 0.2 10*3/uL (ref 0.0–0.7)
Eosinophils Relative: 4 % (ref 0–5)
Lymphocytes Relative: 25 % (ref 12–46)

## 2011-09-22 LAB — COMPREHENSIVE METABOLIC PANEL
ALT: 11 U/L (ref 0–53)
AST: 18 U/L (ref 0–37)
Albumin: 3.2 g/dL — ABNORMAL LOW (ref 3.5–5.2)
Calcium: 9.8 mg/dL (ref 8.4–10.5)
Sodium: 139 mEq/L (ref 135–145)
Total Protein: 7 g/dL (ref 6.0–8.3)

## 2011-09-22 NOTE — Progress Notes (Signed)
Labs drawn today for cbc/diff,cmp,pt,ferr.  Patient on 4mg  of coud.  Call patient at [phone]

## 2011-09-23 ENCOUNTER — Encounter (HOSPITAL_BASED_OUTPATIENT_CLINIC_OR_DEPARTMENT_OTHER): Payer: Medicare Other | Admitting: Oncology

## 2011-09-23 VITALS — BP 140/78 | HR 53 | Temp 98.0°F | Ht 68.0 in | Wt 180.0 lb

## 2011-09-23 DIAGNOSIS — D509 Iron deficiency anemia, unspecified: Secondary | ICD-10-CM

## 2011-09-23 DIAGNOSIS — I82409 Acute embolism and thrombosis of unspecified deep veins of unspecified lower extremity: Secondary | ICD-10-CM

## 2011-09-23 DIAGNOSIS — E7210 Disorders of sulfur-bearing amino-acid metabolism, unspecified: Secondary | ICD-10-CM

## 2011-09-23 DIAGNOSIS — N289 Disorder of kidney and ureter, unspecified: Secondary | ICD-10-CM

## 2011-09-23 NOTE — Progress Notes (Signed)
[name], MD [address] [place] [address]  1. DVT (deep venous thrombosis)  Protime-INR, CBC, Differential, Comprehensive metabolic panel, Homocysteine, serum    CURRENT THERAPY: Coumadin anticoagulation 4 mg daily.  INTERVAL HISTORY: Justin Gilmore 66 y.o. male returns for  regular  visit for followup of  DVT, homocystinemia, and iron deficiency.   Mr. Coulthard is doing very well. He reports he had a nice fourth of July weekend.   He is compliant with his Coumadin is taking 4 mg daily. His most recent INR was 2.13. We'll repeat this in one week.  I personally reviewed and went over laboratory results with the patient.  The patient was able to see a nephrologist, Dr. Fausto Skillern, or his worsening renal function. The patient reports that the nephrologists discontinued his hydrochlorothiazide. Otherwise, the nephrologists was pleased.  The patient reports that he has been taking Chantix which has helped decrease his tobacco abuse. He is now smoking a half a pack per day. He reports that the taste of cigarettes were we'll for him now but he continues to smoke them due to the "habit". He has tried electronic cigarettes but has lost it. His wife is encouraging him to purchase another. It may suggestions that would replace the hand-2-mouth habit with something healthy year such as water bottle, carried, celery, oranges, or other healthy food products.  TRUE continues to drink approximately 1-2 beers daily.  Hematologically, the patient denies any complaints. He denies any blood in his stool, black tarry stool, materia, spontaneous bleeding, gingival bleeding.  Past Medical History  Diagnosis Date  . Hypertension   . Tobacco abuse   . DVT (deep venous thrombosis) 2012    Right lower ext.  Marland Kitchen GERD (gastroesophageal reflux disease)   . Renal cyst   . Renal failure     Stage 2- Stage 3  . DVT (deep venous thrombosis) 11/11/2010  . Arterial insufficiency 11/11/2010  .  Renal insufficiency 11/11/2010  . Hypertension 11/11/2010  . GERD (gastroesophageal reflux disease) 11/11/2010  . 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 ETOH abuse 12/10/2010  . Folic acid deficiency 12/10/2010    has DVT (deep venous thrombosis); Arterial insufficiency; Renal insufficiency; Hypertension; GERD (gastroesophageal reflux disease); Hyperhomocysteinemia; ETOH abuse; Folic acid deficiency; and Iron deficiency anemia on his problem list.      has no known allergies.  Mr. Utz does not currently have medications on file.  Past Surgical History  Procedure Date  . 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 Leonides Sake  . Back surgery     Denies any headaches, dizziness, double vision, fevers, chills, night sweats, nausea, vomiting, diarrhea, constipation, chest pain, heart palpitations, shortness of breath, blood in stool, black tarry stool, urinary pain, urinary burning, urinary frequency, hematuria.   PHYSICAL EXAMINATION  ECOG PERFORMANCE STATUS: 0 - Asymptomatic  Filed Vitals:   09/23/11 1134  BP: 140/78  Pulse: 53  Temp: 98 F (36.7 C)    GENERAL:alert, no distress, well nourished, well developed, comfortable, cooperative and smiling SKIN: skin color, texture, turgor are normal, no rashes or significant lesions HEAD: Normocephalic, No masses, lesions, tenderness or abnormalities EYES: normal, Conjunctiva are pink and non-injected EARS: External ears normal OROPHARYNX:lips, buccal mucosa, and tongue normal and mucous membranes are moist  NECK: supple, trachea midline LYMPH:  not examined BREAST:not examined LUNGS: clear to auscultation and percussion, decreased breath sounds HEART: regular rate & rhythm, no murmurs, no gallops, bradycardia,  S1 normal and S2 normal ABDOMEN:abdomen soft, non-tender, normal bowel sounds and no masses or organomegaly BACK: Back symmetric, no curvature. EXTREMITIES:less then 2 second  capillary refill, no joint deformities, effusion, or inflammation, no edema, no skin discoloration, no cyanosis, positive findings:  Clubbing appreciated.  NEURO: alert & oriented x 3 with fluent speech, no focal motor/sensory deficits, gait normal    ASSESSMENT:  1. Deep vein thrombosis of the right leg.  2. Right LE swelling and discomfort.  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  3. Folic acid deficiency at presentation.  4. Iron deficiency at presentation.  5. Chronic renal insufficiency at presentation.  6. Tobacco abuse, down to 1/2 ppd compared to 2 ppd. On Chantix.  7. History of hypertension.  8. EtOH abuse, approximately 6-9 shots of rum per week with 10-12 cans of beer per week, maybe more.     PLAN:  1. I personally reviewed and went over laboratory results with the patient. 2. Continue Folate daily. 3. Continue Coumadin as directed. 4. PT/INR next week. 5. Lab work in 4 months: CBC diff, CMET, serum homocysteine level. 6. Smoking cessation education provided.  Recommendations provided and described above.  7. EtOH cessation education provided.  8. Patient education regarding clubbing of fingernails.  9. Return in 4 months for follow-up.   All questions were answered. The patient knows to call the clinic with any problems, questions or concerns. We can certainly see the patient much sooner if necessary.  The patient and plan discussed with Glenford Peers, MD and he is in agreement with the aforementioned.  KEFALAS,THOMAS

## 2011-09-23 NOTE — Patient Instructions (Addendum)
[facility]  Discharge Instructions Justin Gilmore  [id number] 11/12/1945 Dr. Glenford Peers  RECOMMENDATIONS MADE BY THE CONSULTANT AND ANY TEST RESULTS WILL BE SENT TO YOUR REFERRING DOCTOR.   EXAM FINDINGS BY MD TODAY AND SIGNS AND SYMPTOMS TO REPORT TO CLINIC OR PRIMARY MD:  Exam good  MEDICATIONS PRESCRIBED: coumadin 4 mg daily  INSTRUCTIONS GIVEN AND DISCUSSED:   SPECIAL INSTRUCTIONS/FOLLOW-UP: Lab work Needed next week 4months to see Dr. Mariel Sleet   I acknowledge that I have been informed and understand all the instructions given to me and received a copy. I do not have any more questions at this time, but understand that I may call the Specialty Clinic at [facility] at [phone] during business hours should I have any further questions or need assistance in obtaining follow-up care.    __________________________________________  _____________  __________ Signature of Patient or Authorized Representative            Date                   Time    __________________________________________ Nurse's Signature

## 2011-09-29 ENCOUNTER — Other Ambulatory Visit (HOSPITAL_COMMUNITY): Payer: Self-pay | Admitting: Oncology

## 2011-09-29 ENCOUNTER — Encounter (HOSPITAL_BASED_OUTPATIENT_CLINIC_OR_DEPARTMENT_OTHER): Payer: Medicare Other

## 2011-09-29 DIAGNOSIS — Z7901 Long term (current) use of anticoagulants: Secondary | ICD-10-CM

## 2011-09-29 DIAGNOSIS — I82409 Acute embolism and thrombosis of unspecified deep veins of unspecified lower extremity: Secondary | ICD-10-CM

## 2011-09-29 LAB — PROTIME-INR: Prothrombin Time: 22.5 seconds — ABNORMAL HIGH (ref 11.6–15.2)

## 2011-09-29 NOTE — Progress Notes (Signed)
Labs drawn today for pt.  Patient on 4mg  of coud.  Call patient at [phone]

## 2011-10-08 ENCOUNTER — Other Ambulatory Visit (HOSPITAL_COMMUNITY): Payer: Self-pay | Admitting: Oncology

## 2011-10-08 ENCOUNTER — Encounter (HOSPITAL_BASED_OUTPATIENT_CLINIC_OR_DEPARTMENT_OTHER): Payer: Medicare Other

## 2011-10-08 DIAGNOSIS — I82409 Acute embolism and thrombosis of unspecified deep veins of unspecified lower extremity: Secondary | ICD-10-CM

## 2011-10-08 LAB — PROTIME-INR
INR: 2.18 — ABNORMAL HIGH (ref 0.00–1.49)
Prothrombin Time: 24.6 seconds — ABNORMAL HIGH (ref 11.6–15.2)

## 2011-10-08 NOTE — Progress Notes (Signed)
Labs drawn today for pt.  Patient on 4 and 5mg  alternating.  Call patient at [phone]

## 2011-10-22 ENCOUNTER — Other Ambulatory Visit (HOSPITAL_COMMUNITY): Payer: Self-pay | Admitting: Oncology

## 2011-10-22 ENCOUNTER — Encounter (HOSPITAL_COMMUNITY): Payer: Medicare Other | Attending: Oncology

## 2011-10-22 DIAGNOSIS — I82409 Acute embolism and thrombosis of unspecified deep veins of unspecified lower extremity: Secondary | ICD-10-CM | POA: Insufficient documentation

## 2011-10-22 NOTE — Progress Notes (Signed)
Labs drawn today for pt 

## 2011-10-29 ENCOUNTER — Other Ambulatory Visit (HOSPITAL_COMMUNITY): Payer: Self-pay | Admitting: Oncology

## 2011-10-29 ENCOUNTER — Encounter (HOSPITAL_BASED_OUTPATIENT_CLINIC_OR_DEPARTMENT_OTHER): Payer: Medicare Other

## 2011-10-29 DIAGNOSIS — I82409 Acute embolism and thrombosis of unspecified deep veins of unspecified lower extremity: Secondary | ICD-10-CM

## 2011-10-29 LAB — PROTIME-INR: INR: 2.12 — ABNORMAL HIGH (ref 0.00–1.49)

## 2011-10-29 NOTE — Progress Notes (Signed)
Labs drawn today for pt 

## 2011-11-05 ENCOUNTER — Other Ambulatory Visit (HOSPITAL_COMMUNITY): Payer: Self-pay | Admitting: Oncology

## 2011-11-05 ENCOUNTER — Encounter (HOSPITAL_BASED_OUTPATIENT_CLINIC_OR_DEPARTMENT_OTHER): Payer: Medicare Other

## 2011-11-05 DIAGNOSIS — I82409 Acute embolism and thrombosis of unspecified deep veins of unspecified lower extremity: Secondary | ICD-10-CM

## 2011-11-05 NOTE — Progress Notes (Signed)
Labs drawn today for pt 

## 2011-11-10 ENCOUNTER — Encounter (HOSPITAL_BASED_OUTPATIENT_CLINIC_OR_DEPARTMENT_OTHER): Payer: Medicare Other

## 2011-11-10 ENCOUNTER — Other Ambulatory Visit (HOSPITAL_COMMUNITY): Payer: Self-pay | Admitting: Oncology

## 2011-11-10 DIAGNOSIS — I82409 Acute embolism and thrombosis of unspecified deep veins of unspecified lower extremity: Secondary | ICD-10-CM

## 2011-11-10 LAB — PROTIME-INR
INR: 1.1 (ref 0.00–1.49)
Prothrombin Time: 14.4 seconds (ref 11.6–15.2)

## 2011-11-10 NOTE — Addendum Note (Signed)
Addended by: Evelena Leyden on: 11/10/2011 04:14 PM   Modules accepted: Orders

## 2011-11-10 NOTE — Progress Notes (Signed)
Labs drawn today for pt 

## 2011-11-13 ENCOUNTER — Encounter (HOSPITAL_BASED_OUTPATIENT_CLINIC_OR_DEPARTMENT_OTHER): Payer: Medicare Other

## 2011-11-13 DIAGNOSIS — I82409 Acute embolism and thrombosis of unspecified deep veins of unspecified lower extremity: Secondary | ICD-10-CM

## 2011-11-13 LAB — PROTIME-INR
INR: 1.17 (ref 0.00–1.49)
Prothrombin Time: 15.1 seconds (ref 11.6–15.2)

## 2011-11-13 NOTE — Progress Notes (Signed)
Labs drawn today for pt 

## 2011-11-18 ENCOUNTER — Encounter (HOSPITAL_COMMUNITY): Payer: Medicare Other | Attending: Oncology

## 2011-11-18 DIAGNOSIS — I82409 Acute embolism and thrombosis of unspecified deep veins of unspecified lower extremity: Secondary | ICD-10-CM | POA: Insufficient documentation

## 2011-11-18 LAB — PROTIME-INR: Prothrombin Time: 15.6 seconds — ABNORMAL HIGH (ref 11.6–15.2)

## 2011-11-18 NOTE — Progress Notes (Signed)
Labs drawn today for pt 

## 2011-11-23 ENCOUNTER — Other Ambulatory Visit (HOSPITAL_COMMUNITY): Payer: Self-pay | Admitting: *Deleted

## 2011-11-23 NOTE — Progress Notes (Signed)
Spoke with family member of Justin Gilmore and she confirmed that they were told to start 5 mg daily on 9/4 and return 11/25/2011 for pt inr.

## 2011-11-25 ENCOUNTER — Encounter (HOSPITAL_BASED_OUTPATIENT_CLINIC_OR_DEPARTMENT_OTHER): Payer: Medicare Other

## 2011-11-25 ENCOUNTER — Other Ambulatory Visit (HOSPITAL_COMMUNITY): Payer: Self-pay | Admitting: Oncology

## 2011-11-25 DIAGNOSIS — I82409 Acute embolism and thrombosis of unspecified deep veins of unspecified lower extremity: Secondary | ICD-10-CM

## 2011-11-25 LAB — PROTIME-INR
INR: 3.68 — ABNORMAL HIGH (ref 0.00–1.49)
Prothrombin Time: 37.1 s — ABNORMAL HIGH (ref 11.6–15.2)

## 2011-11-25 NOTE — Progress Notes (Signed)
Labs drawn today for pt 

## 2011-11-30 ENCOUNTER — Other Ambulatory Visit (HOSPITAL_COMMUNITY): Payer: Self-pay | Admitting: Oncology

## 2011-11-30 ENCOUNTER — Encounter (HOSPITAL_COMMUNITY): Payer: Medicare Other

## 2011-11-30 DIAGNOSIS — I82409 Acute embolism and thrombosis of unspecified deep veins of unspecified lower extremity: Secondary | ICD-10-CM

## 2011-11-30 NOTE — Progress Notes (Signed)
Labs drawn today for pt 

## 2011-12-04 ENCOUNTER — Other Ambulatory Visit (HOSPITAL_COMMUNITY): Payer: Self-pay | Admitting: Oncology

## 2011-12-04 ENCOUNTER — Encounter (HOSPITAL_BASED_OUTPATIENT_CLINIC_OR_DEPARTMENT_OTHER): Payer: Medicare Other

## 2011-12-04 DIAGNOSIS — I82409 Acute embolism and thrombosis of unspecified deep veins of unspecified lower extremity: Secondary | ICD-10-CM

## 2011-12-04 NOTE — Progress Notes (Signed)
Labs drawn today for pt 

## 2011-12-11 ENCOUNTER — Other Ambulatory Visit (HOSPITAL_COMMUNITY): Payer: Self-pay | Admitting: Oncology

## 2011-12-11 ENCOUNTER — Encounter (HOSPITAL_COMMUNITY): Payer: Medicare Other

## 2011-12-11 DIAGNOSIS — I82409 Acute embolism and thrombosis of unspecified deep veins of unspecified lower extremity: Secondary | ICD-10-CM

## 2011-12-11 LAB — PROTIME-INR: INR: 2.99 — ABNORMAL HIGH (ref 0.00–1.49)

## 2011-12-11 NOTE — Progress Notes (Signed)
Labs drawn today for pt 

## 2011-12-18 ENCOUNTER — Telehealth (HOSPITAL_COMMUNITY): Payer: Self-pay | Admitting: *Deleted

## 2011-12-18 ENCOUNTER — Encounter (HOSPITAL_COMMUNITY): Payer: Medicare Other | Attending: Oncology

## 2011-12-18 ENCOUNTER — Other Ambulatory Visit (HOSPITAL_COMMUNITY): Payer: Self-pay | Admitting: Oncology

## 2011-12-18 DIAGNOSIS — I82409 Acute embolism and thrombosis of unspecified deep veins of unspecified lower extremity: Secondary | ICD-10-CM | POA: Insufficient documentation

## 2011-12-18 LAB — PROTIME-INR
INR: 2.14 — ABNORMAL HIGH (ref 0.00–1.49)
Prothrombin Time: 23 seconds — ABNORMAL HIGH (ref 11.6–15.2)

## 2011-12-18 NOTE — Telephone Encounter (Signed)
Pt's wife or caregiver answered phone and i told her for pt to continue same dose and to return 10/15 @ 8:50. She verbalized understanding.

## 2011-12-18 NOTE — Telephone Encounter (Signed)
Message copied by Oda Kilts on Fri Dec 18, 2011  3:19 PM ------      Message from: Ellouise Newer      Created: Fri Dec 18, 2011 10:33 AM       Same dose INR in 10 days

## 2011-12-18 NOTE — Progress Notes (Signed)
Justin Gilmore presented for [employer]. Labs per MD order drawn via Peripheral Line 25 gauge needle inserted in RAC.  Procedure without incident.  Patient tolerated procedure well.

## 2011-12-29 ENCOUNTER — Other Ambulatory Visit (HOSPITAL_COMMUNITY): Payer: Self-pay | Admitting: Oncology

## 2011-12-29 ENCOUNTER — Telehealth (HOSPITAL_COMMUNITY): Payer: Self-pay

## 2011-12-29 ENCOUNTER — Encounter (HOSPITAL_COMMUNITY): Payer: Medicare Other

## 2011-12-29 DIAGNOSIS — I82409 Acute embolism and thrombosis of unspecified deep veins of unspecified lower extremity: Secondary | ICD-10-CM

## 2011-12-29 LAB — PROTIME-INR: INR: 2.84 — ABNORMAL HIGH (ref 0.00–1.49)

## 2011-12-29 NOTE — Telephone Encounter (Signed)
Patient called to inform of PT/INR results ans instructions to continue current dosage of Coumadin.  Patient verbalized understanding.  Appointment made for follow up lab test.

## 2011-12-29 NOTE — Progress Notes (Signed)
Labs drawn today for pt 

## 2012-01-05 ENCOUNTER — Encounter (HOSPITAL_BASED_OUTPATIENT_CLINIC_OR_DEPARTMENT_OTHER): Payer: Medicare Other

## 2012-01-05 DIAGNOSIS — I82409 Acute embolism and thrombosis of unspecified deep veins of unspecified lower extremity: Secondary | ICD-10-CM

## 2012-01-05 LAB — PROTIME-INR: Prothrombin Time: 28.4 seconds — ABNORMAL HIGH (ref 11.6–15.2)

## 2012-01-05 NOTE — Progress Notes (Signed)
Rudy Jew Charon's reason for visit today are for labs as scheduled per MD orders.  Venipuncture performed with a 23 gauge butterfly needle to R Antecubital.  Justin Gilmore tolerated venipuncture well and without incident; questions were answered and patient was discharged.

## 2012-01-19 ENCOUNTER — Encounter (HOSPITAL_COMMUNITY): Payer: Medicare Other | Attending: Oncology

## 2012-01-19 DIAGNOSIS — E7210 Disorders of sulfur-bearing amino-acid metabolism, unspecified: Secondary | ICD-10-CM | POA: Insufficient documentation

## 2012-01-19 DIAGNOSIS — I82409 Acute embolism and thrombosis of unspecified deep veins of unspecified lower extremity: Secondary | ICD-10-CM | POA: Insufficient documentation

## 2012-01-19 NOTE — Progress Notes (Signed)
Labs drawn today for pt 

## 2012-01-25 ENCOUNTER — Encounter (HOSPITAL_COMMUNITY): Payer: Medicare Other

## 2012-01-25 DIAGNOSIS — I82409 Acute embolism and thrombosis of unspecified deep veins of unspecified lower extremity: Secondary | ICD-10-CM

## 2012-01-25 LAB — CBC
HCT: 41.5 % (ref 39.0–52.0)
Hemoglobin: 13.7 g/dL (ref 13.0–17.0)
MCV: 85.7 fL (ref 78.0–100.0)
RBC: 4.84 MIL/uL (ref 4.22–5.81)
WBC: 4.1 10*3/uL (ref 4.0–10.5)

## 2012-01-25 LAB — COMPREHENSIVE METABOLIC PANEL
ALT: 11 U/L (ref 0–53)
CO2: 23 mEq/L (ref 19–32)
Calcium: 9.4 mg/dL (ref 8.4–10.5)
Creatinine, Ser: 1.59 mg/dL — ABNORMAL HIGH (ref 0.50–1.35)
GFR calc Af Amer: 51 mL/min — ABNORMAL LOW (ref >90)
GFR calc non Af Amer: 44 mL/min — ABNORMAL LOW (ref >90)
Glucose, Bld: 102 mg/dL — ABNORMAL HIGH (ref 70–99)
Sodium: 140 mEq/L (ref 135–145)

## 2012-01-25 LAB — DIFFERENTIAL
Eosinophils Relative: 3 % (ref 0–5)
Lymphocytes Relative: 36 % (ref 12–46)
Lymphs Abs: 1.5 10*3/uL (ref 0.7–4.0)
Monocytes Absolute: 0.3 10*3/uL (ref 0.1–1.0)

## 2012-01-25 NOTE — Progress Notes (Addendum)
Labs drawn today for cbc/diff,cmp,homocysteine 

## 2012-01-27 ENCOUNTER — Ambulatory Visit (HOSPITAL_COMMUNITY): Payer: Medicare Other | Admitting: Oncology

## 2012-02-02 ENCOUNTER — Encounter (HOSPITAL_COMMUNITY): Payer: Self-pay | Admitting: Oncology

## 2012-02-02 ENCOUNTER — Encounter (HOSPITAL_BASED_OUTPATIENT_CLINIC_OR_DEPARTMENT_OTHER): Payer: Medicare Other | Admitting: Oncology

## 2012-02-02 VITALS — BP 159/85 | HR 58 | Temp 98.6°F | Resp 18 | Wt 182.4 lb

## 2012-02-02 DIAGNOSIS — N189 Chronic kidney disease, unspecified: Secondary | ICD-10-CM

## 2012-02-02 DIAGNOSIS — F1010 Alcohol abuse, uncomplicated: Secondary | ICD-10-CM

## 2012-02-02 DIAGNOSIS — E7211 Homocystinuria: Secondary | ICD-10-CM

## 2012-02-02 DIAGNOSIS — I82409 Acute embolism and thrombosis of unspecified deep veins of unspecified lower extremity: Secondary | ICD-10-CM

## 2012-02-02 DIAGNOSIS — D509 Iron deficiency anemia, unspecified: Secondary | ICD-10-CM

## 2012-02-02 MED ORDER — WARFARIN SODIUM 5 MG PO TABS: 5.0000 mg | ORAL_TABLET | Freq: Every day | ORAL | Status: DC

## 2012-02-02 NOTE — Progress Notes (Signed)
Justin Obey, MD [address] [place] [address]  1. DVT (deep venous thrombosis)  CBC with Differential, Comprehensive metabolic panel, warfarin (COUMADIN) 5 MG tablet  2. ETOH abuse    3. Hyperhomocysteinemia  CBC with Differential, Comprehensive metabolic panel, Homocysteine    CURRENT THERAPY: Coumadin anticoagulation 5/6 mg  INTERVAL HISTORY: Justin Gilmore 66 y.o. male returns for  regular  visit for followup of  DVT, homocystinemia, and iron deficiency.   I personally reviewed and went over laboratory results with the patient. His labs are impressive for continued renal insufficiency and elevated homocysteine level at 49.5 which is his highest that we have documented.   Justin Gilmore reports that he is smoking less.  He is smoking 1/2 ppd compared to 2 1/2 ppd in the past.  He reports "I think I let most of them burn out."  He also smokes a pipe and in the 1/2 ppd quote, he included his pipe smoking.   He continues to drink EtOH.  He drinks about 2 beers per day 5/7 days per week.  He drinks at home.  His friends come over to the house and they drink together.  He only drinks beer.  He denies any liquor or wine abuse.  He reports that he drinks the least out of his buddies at the house.   As usual, Justin Gilmore denies any complaints.  Complete ROS questioning is negative.   Smoking and EtOH cessation education provided today.   Past Medical History  Diagnosis Date  . Hypertension   . Tobacco abuse   . DVT (deep venous thrombosis) 2012    Right lower ext.  Marland Kitchen GERD (gastroesophageal reflux disease)   . Renal cyst   . Renal failure     Stage 2- Stage 3  . DVT (deep venous thrombosis) 11/11/2010  . Arterial insufficiency 11/11/2010  . Renal insufficiency 11/11/2010  . Hypertension 11/11/2010  . GERD (gastroesophageal reflux disease) 11/11/2010  . 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 ETOH abuse 12/10/2010  . Folic acid deficiency 12/10/2010    has DVT (deep venous  thrombosis); Arterial insufficiency; Renal insufficiency; Hypertension; GERD (gastroesophageal reflux disease); Hyperhomocysteinemia; ETOH abuse; Folic acid deficiency; and Iron deficiency anemia on his problem list.      has no known allergies.  Justin Gilmore had no medications administered during this visit.  Past Surgical History  Procedure Date  . 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 Leonides Sake  . Back surgery     Denies any headaches, dizziness, double vision, fevers, chills, night sweats, nausea, vomiting, diarrhea, constipation, chest pain, heart palpitations, shortness of breath, blood in stool, black tarry stool, urinary pain, urinary burning, urinary frequency, hematuria.   PHYSICAL EXAMINATION  ECOG PERFORMANCE STATUS: 0 - Asymptomatic  Filed Vitals:   02/02/12 1300  BP: 159/85  Pulse: 58  Temp: 98.6 F (37 C)  Resp: 18    GENERAL:alert, no distress, well nourished, well developed, comfortable, cooperative and smiling  SKIN: skin color, texture, turgor are normal, no rashes or significant lesions  HEAD: Normocephalic, No masses, lesions, tenderness or abnormalities  EYES: normal, Conjunctiva are pink and non-injected  EARS: External ears normal  OROPHARYNX:lips, buccal mucosa, and tongue normal and mucous membranes are moist  NECK: supple, trachea midline  LYMPH: not examined  BREAST:not examined  LUNGS: clear to auscultation and percussion, decreased breath sounds  HEART: regular rate & rhythm, no murmurs, no gallops, bradycardia, S1 normal  and S2 normal  ABDOMEN:abdomen soft, non-tender, normal bowel sounds and no masses or organomegaly  BACK: Back symmetric, no curvature.  EXTREMITIES:less then 2 second capillary refill, no joint deformities, effusion, or inflammation, no edema, no skin discoloration, no cyanosis, positive findings: Clubbing appreciated.  NEURO: alert & oriented x 3 with fluent speech, no  focal motor/sensory deficits, gait normal   LABORATORY DATA: CBC    Component Value Date/Time   WBC 4.1 01/25/2012 1101   RBC 4.84 01/25/2012 1101   HGB 13.7 01/25/2012 1101   HCT 41.5 01/25/2012 1101   PLT 233 01/25/2012 1101   MCV 85.7 01/25/2012 1101   MCH 28.3 01/25/2012 1101   MCHC 33.0 01/25/2012 1101   RDW 15.3 01/25/2012 1101   LYMPHSABS 1.5 01/25/2012 1101   MONOABS 0.3 01/25/2012 1101   EOSABS 0.1 01/25/2012 1101   BASOSABS 0.0 01/25/2012 1101   Results for Justin Gilmore, Justin Gilmore (MRN [id number]) as of 02/02/2012 13:30  Ref. Range 01/25/2012 11:01  Sodium Latest Range: 135-145 mEq/L 140  Potassium Latest Range: 3.5-5.1 mEq/L 4.3  Chloride Latest Range: 96-112 mEq/L 104  CO2 Latest Range: 19-32 mEq/L 23  BUN Latest Range: 6-23 mg/dL 15  Creatinine Latest Range: 0.50-1.35 mg/dL 4.09 (H)  Calcium Latest Range: 8.4-10.5 mg/dL 9.4  GFR calc non Af Amer Latest Range: >90 mL/min 44 (L)  GFR calc Af Amer Latest Range: >90 mL/min 51 (L)  Glucose Latest Range: 70-99 mg/dL 811 (H)  Alkaline Phosphatase Latest Range: 39-117 U/L 77  Albumin Latest Range: 3.5-5.2 g/dL 3.1 (L)  AST Latest Range: 0-37 U/L 20  ALT Latest Range: 0-53 U/L 11  Total Protein Latest Range: 6.0-8.3 g/dL 6.8  Total Bilirubin Latest Range: 0.3-1.2 mg/dL 0.3  Homocysteine Latest Range: 4.0-15.4 umol/L 49.5 (H)   Lab Results  Component Value Date   INR 2.69* 01/19/2012   INR 2.84* 01/05/2012   INR 2.84* 12/29/2011     ASSESSMENT:  1. Deep vein thrombosis of the right leg.  2. Right LE swelling and discomfort.  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  3. Folic acid deficiency at presentation.  4. Iron deficiency at presentation.  5. Chronic renal insufficiency at presentation.  6. Tobacco abuse, down to 1/2 ppd compared to 2 ppd. On Chantix.  7. History of hypertension.  8. EtOH abuse, approximately 6-9 shots of rum per week with 10-12 cans of beer per week,  maybe more.    PLAN:  1. I personally reviewed and went over laboratory results with the patient. 2. Continue Coumadin and INR checks as directed 3. Lab work in 3 months: CBC diff, CMET, serum homocysteine level 4. Refill of Coumadin e-scribed to [facility]. 5. Recommended the patient keep a BP diary and report results to PCP.  6. Smoking and EtOH cessation education provided.  7. Return in 3 months for follow-up    All questions were answered. The patient knows to call the clinic with any problems, questions or concerns. We can certainly see the patient much sooner if necessary.  The patient and plan discussed with Glenford Peers, MD and he is in agreement with the aforementioned.  KEFALAS,THOMAS

## 2012-02-02 NOTE — Patient Instructions (Addendum)
[facility]  Discharge Instructions  RECOMMENDATIONS MADE BY THE CONSULTANT AND ANY TEST RESULTS WILL BE SENT TO YOUR REFERRING DOCTOR.   EXAM FINDINGS BY MD TODAY AND SIGNS AND SYMPTOMS TO REPORT TO CLINIC OR PRIMARY MD: exam and discussion by PA.  No changes.  Continue your coumadin as ordered.  MEDICATIONS PRESCRIBED: none   INSTRUCTIONS GIVEN AND DISCUSSED: Other :  Report unusual bruising or bleeding, swelling of your extremities, etc.  SPECIAL INSTRUCTIONS/FOLLOW-UP: Lab work Needed PT level as scheduled and Return to Clinic in 3 months to see MD>   I acknowledge that I have been informed and understand all the instructions given to me and received a copy. I do not have any more questions at this time, but understand that I may call the Specialty Clinic at [facility] at [phone] during business hours should I have any further questions or need assistance in obtaining follow-up care.    __________________________________________  _____________  __________ Signature of Patient or Authorized Representative            Date                   Time    __________________________________________ Nurse's Signature

## 2012-02-03 ENCOUNTER — Other Ambulatory Visit (HOSPITAL_COMMUNITY): Payer: Self-pay | Admitting: Oncology

## 2012-02-03 NOTE — Progress Notes (Signed)
See if he can get a B complex vitamin and take 1 daily.

## 2012-02-04 ENCOUNTER — Encounter: Payer: Self-pay | Admitting: Vascular Surgery

## 2012-02-16 ENCOUNTER — Encounter (HOSPITAL_COMMUNITY): Payer: Medicare Other | Attending: Oncology

## 2012-02-16 DIAGNOSIS — I82409 Acute embolism and thrombosis of unspecified deep veins of unspecified lower extremity: Secondary | ICD-10-CM | POA: Insufficient documentation

## 2012-02-16 LAB — PROTIME-INR: Prothrombin Time: 37.1 seconds — ABNORMAL HIGH (ref 11.6–15.2)

## 2012-02-16 NOTE — Progress Notes (Signed)
Justin Gilmore presented for [employer]. Labs per MD order drawn via Peripheral Line 23 gauge needle inserted in right AC  Good blood return present. Procedure without incident.  Needle removed intact. Patient tolerated procedure well.

## 2012-02-19 ENCOUNTER — Encounter (HOSPITAL_COMMUNITY): Payer: Medicare Other

## 2012-02-19 DIAGNOSIS — I82409 Acute embolism and thrombosis of unspecified deep veins of unspecified lower extremity: Secondary | ICD-10-CM

## 2012-02-19 LAB — PROTIME-INR
INR: 2.03 — ABNORMAL HIGH (ref 0.00–1.49)
Prothrombin Time: 22.1 seconds — ABNORMAL HIGH (ref 11.6–15.2)

## 2012-02-26 ENCOUNTER — Other Ambulatory Visit (HOSPITAL_COMMUNITY): Payer: Self-pay | Admitting: Oncology

## 2012-02-26 ENCOUNTER — Encounter (HOSPITAL_BASED_OUTPATIENT_CLINIC_OR_DEPARTMENT_OTHER): Payer: Medicare Other

## 2012-02-26 DIAGNOSIS — I82409 Acute embolism and thrombosis of unspecified deep veins of unspecified lower extremity: Secondary | ICD-10-CM

## 2012-02-26 MED ORDER — PHYTONADIONE 5 MG PO TABS: 10.0000 mg | ORAL_TABLET | Freq: Once | ORAL | Status: DC

## 2012-02-26 NOTE — Progress Notes (Signed)
Patient and wife instructed for patient to take Vitamin K 10mg  today as soon as possible. Called in to [place] by Elijah Birk. Patient to not take any coumadin until Sunday pm. Sunday pm take 4mg . Return Monday am at 830 for INR. Patient's wife repeated back all instructions and verbalized understanding. Patient has Coumadin 2mg  and will take 2 tablets to = 4mg .

## 2012-02-26 NOTE — Progress Notes (Signed)
Labs drawn today for pt 

## 2012-02-26 NOTE — Progress Notes (Signed)
CRITICAL VALUE ALERT  Critical value received:  INR 6.51  Date of notification:  02/26/12  Time of notification:  0920  Critical value read back:yes  Nurse who received alert:  Sherley Bounds  MD notified (1st page): Tom  Time of first page:  [phone]  Orders received and relayed to patient and family. See other note please. Kriste Basque took the critical and I called the doctor and patient.

## 2012-02-27 IMAGING — CT CT CHEST W/O CM
2 of 3 series · 15 of 36 positions shown, 18 images · non-contrast
Comparison: November 24, 2010

CLINICAL DATA: Follow-up lung nodule, smoker

CT CHEST WITHOUT CONTRAST
TECHNIQUE: Multidetector CT imaging of the chest was performed
following the standard protocol without IV contrast.

[Series 2: chestroutine 5.0 b40f · axial · 0.69mm/px · z∈[-306,-61]mm · 12 of 59 slices shown, 15 images]
[im 5/59  mediastinal]
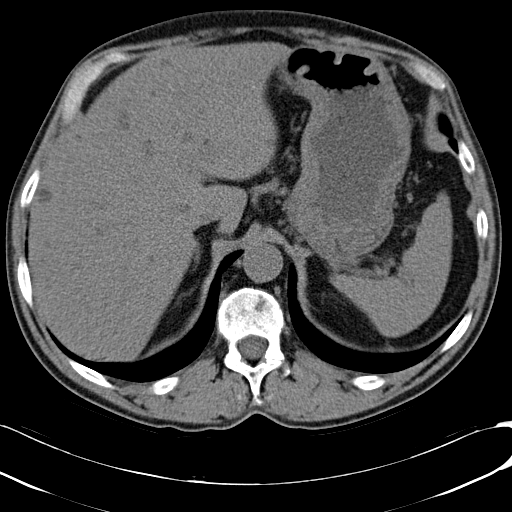
[im 5/59  lung]
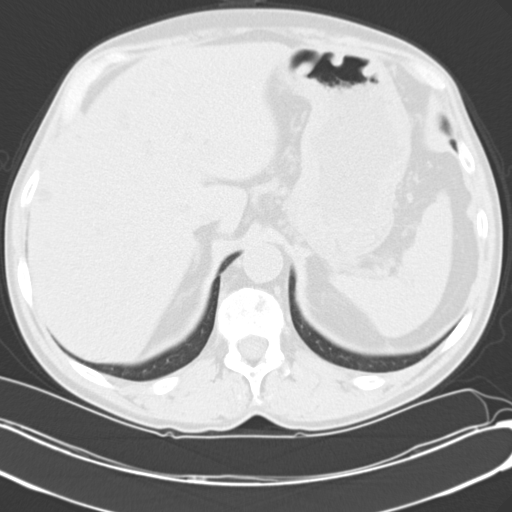
[im 9/59  lung]
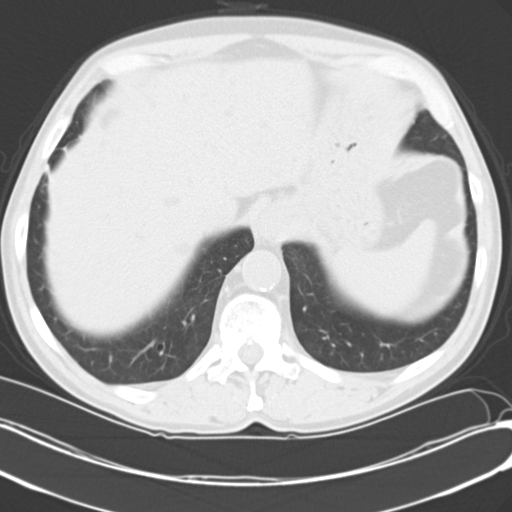
[im 13/59  lung]
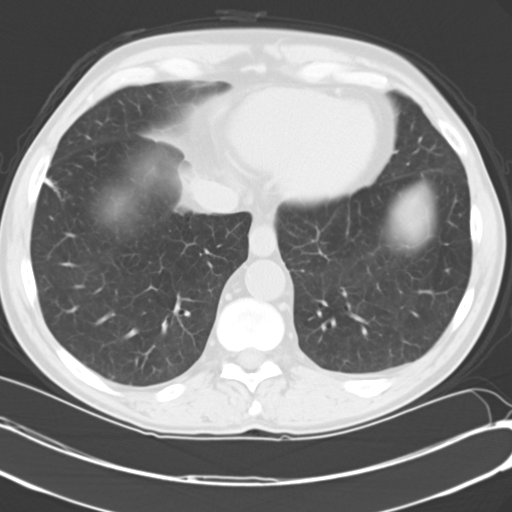
[im 18/59  lung]
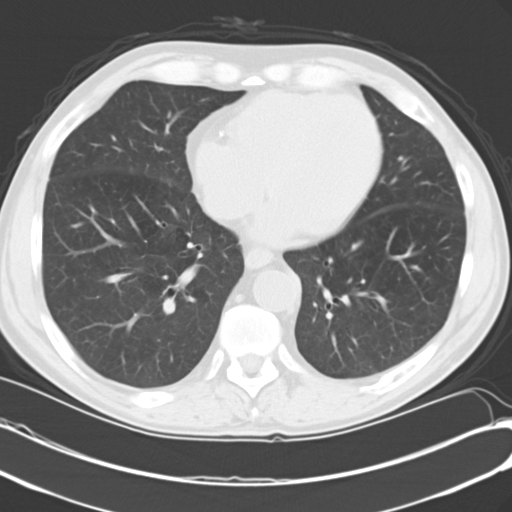
[im 22/59  mediastinal]
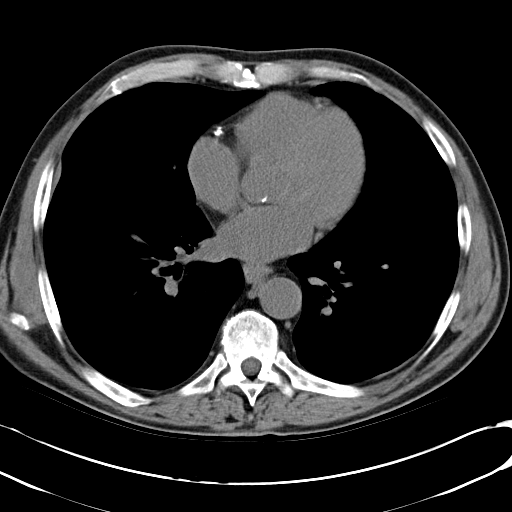
[im 22/59  lung]
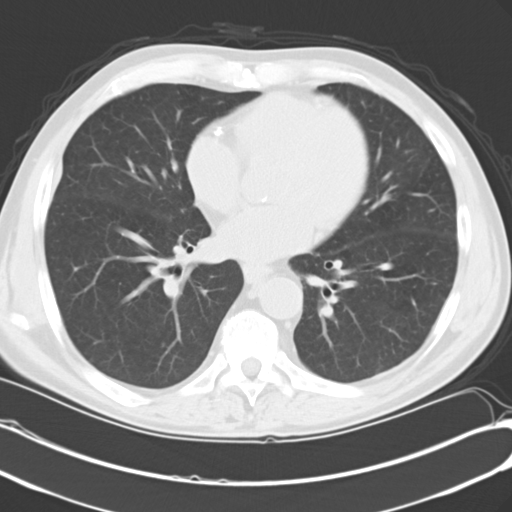
[im 26/59  lung]
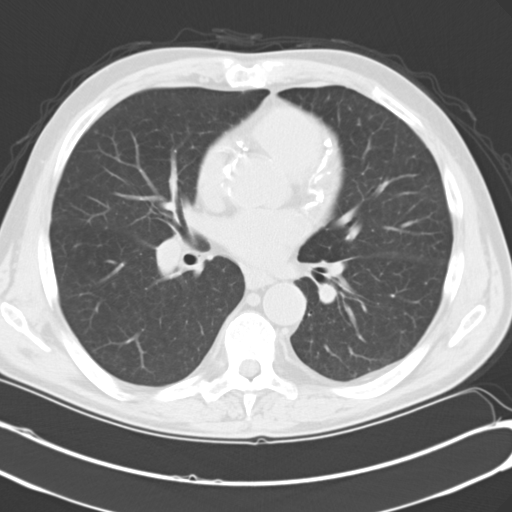
[im 33/59  lung]
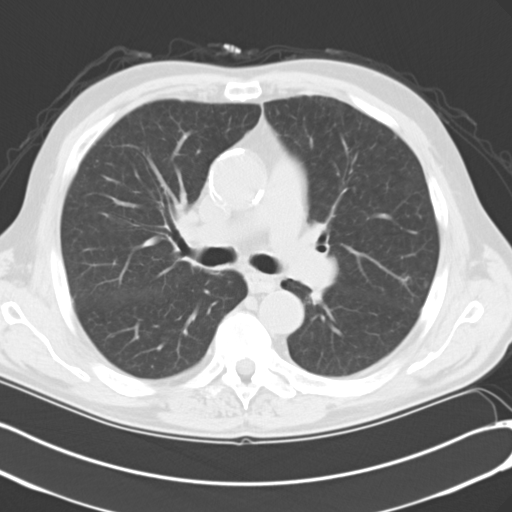
[im 37/59  lung]
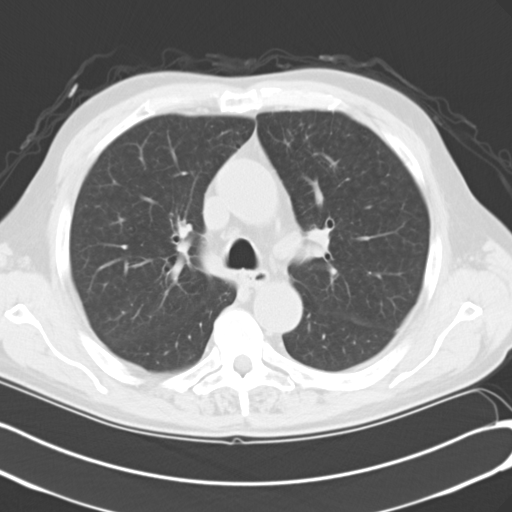
[im 41/59  mediastinal]
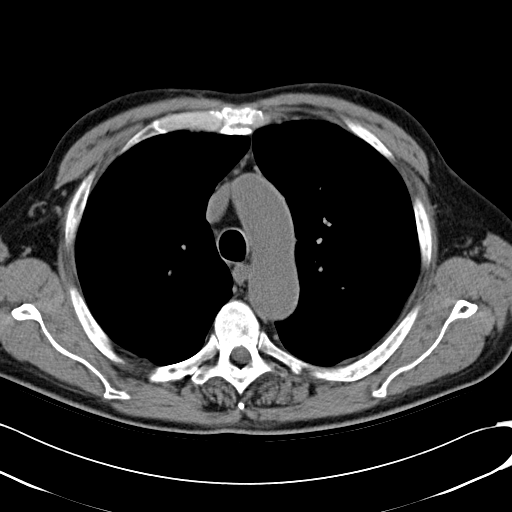
[im 41/59  lung]
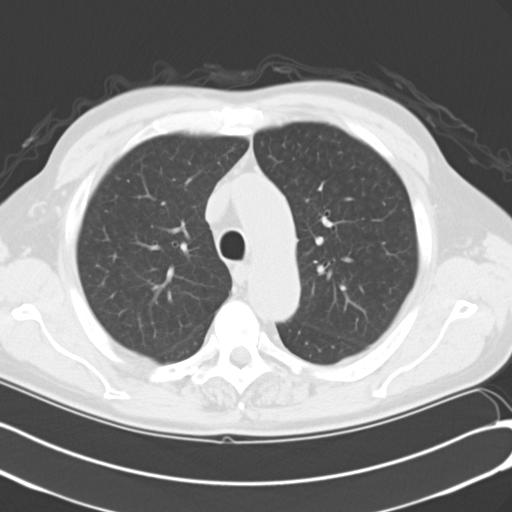
[im 46/59  lung]
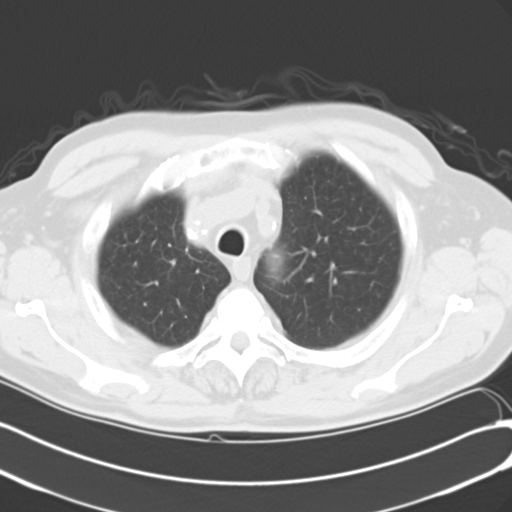
[im 50/59  lung]
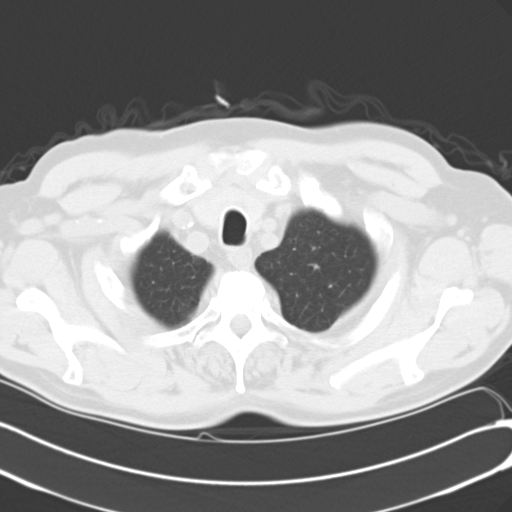
[im 54/59  lung]
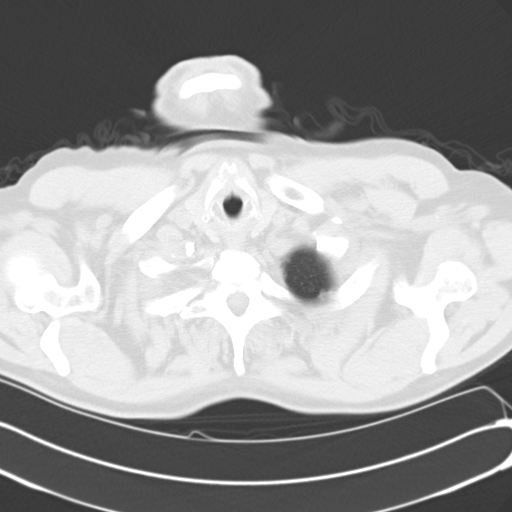

[Series 4: mpr coro 3mm · coronal · 0.57mm/px · 3 of 83 slices shown]
[im 17/83  lung]
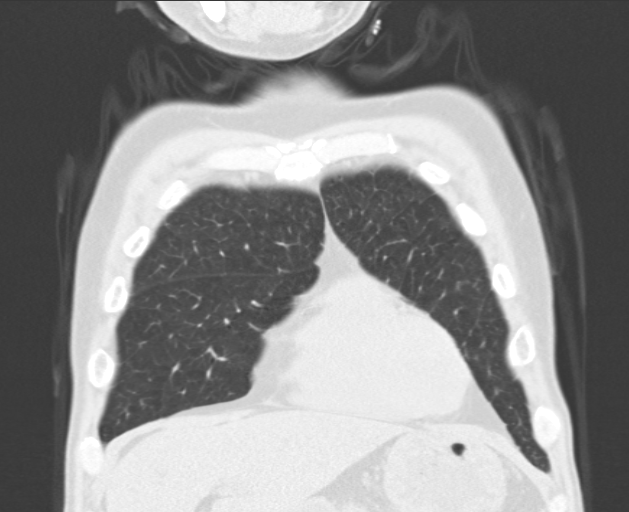
[im 33/83  lung]
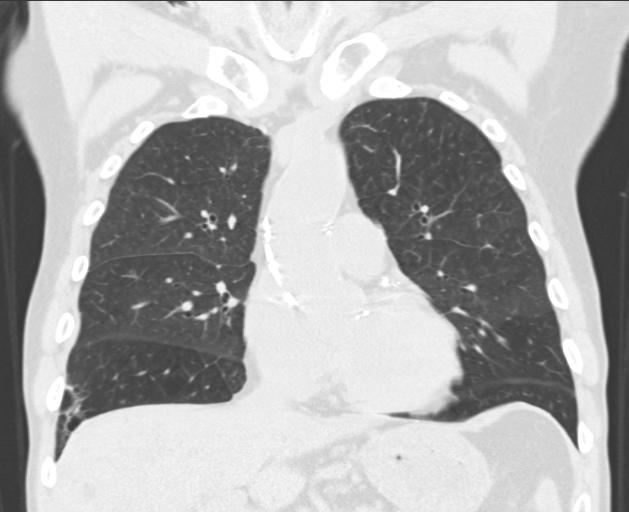
[im 50/83  lung]
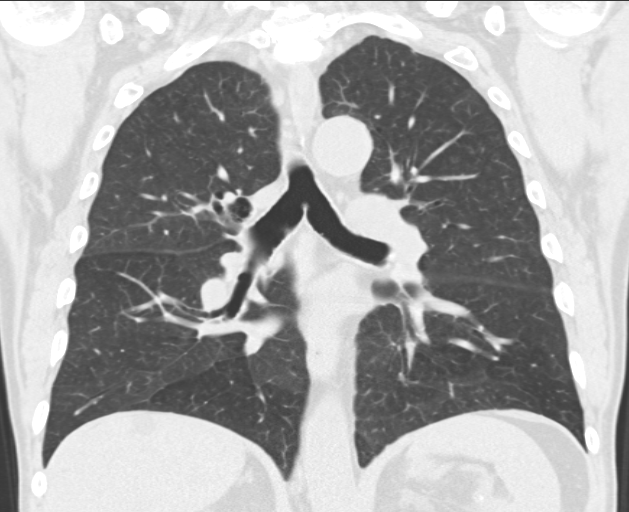

[15 of 36 positions shown; findings below may reference images not displayed]

FINDINGS: Scarring in the right lateral lung base and faint, minute
subpleural nodularity bilaterally are stable.  There are no
suspicious pulmonary nodules.  Mild emphysematous changes are
noted.  There are no focal infiltrates or effusions.  There is no
axillary, hilar, mediastinal adenopathy.  Aortic and coronary
artery atherosclerotic calcification is again noted.

Within the visualized upper abdomen, numerous hepatic cysts do not
appear changed.  The previously identified left adrenal adenoma is
not imaged on this study.  Diffuse osseous heterogeneity is again
identified and was better evaluated on the November 26, 2010 MRI
scan.
IMPRESSION: Stable subpleural scarring in the right lung base and faint
subpleural nodularity bilaterally.  No suspicious lesions are
identified.

No change in osseous heterogeneity and numerous small hepatic
cysts.

## 2012-02-29 ENCOUNTER — Encounter (HOSPITAL_COMMUNITY): Payer: Medicare Other

## 2012-02-29 DIAGNOSIS — I82409 Acute embolism and thrombosis of unspecified deep veins of unspecified lower extremity: Secondary | ICD-10-CM

## 2012-02-29 LAB — PROTIME-INR: Prothrombin Time: 15.6 seconds — ABNORMAL HIGH (ref 11.6–15.2)

## 2012-02-29 NOTE — Progress Notes (Signed)
Labs drawn today for pt 

## 2012-03-01 ENCOUNTER — Other Ambulatory Visit (HOSPITAL_COMMUNITY): Payer: Self-pay | Admitting: Oncology

## 2012-03-07 ENCOUNTER — Other Ambulatory Visit (HOSPITAL_COMMUNITY): Payer: Self-pay | Admitting: Oncology

## 2012-03-07 ENCOUNTER — Encounter (HOSPITAL_BASED_OUTPATIENT_CLINIC_OR_DEPARTMENT_OTHER): Payer: Medicare Other

## 2012-03-07 DIAGNOSIS — I82409 Acute embolism and thrombosis of unspecified deep veins of unspecified lower extremity: Secondary | ICD-10-CM

## 2012-03-07 NOTE — Progress Notes (Signed)
Labs drawn today for pt 

## 2012-03-11 ENCOUNTER — Other Ambulatory Visit (HOSPITAL_COMMUNITY): Payer: Self-pay

## 2012-03-11 DIAGNOSIS — I82409 Acute embolism and thrombosis of unspecified deep veins of unspecified lower extremity: Secondary | ICD-10-CM

## 2012-03-14 ENCOUNTER — Encounter (HOSPITAL_BASED_OUTPATIENT_CLINIC_OR_DEPARTMENT_OTHER): Payer: Medicare Other

## 2012-03-14 ENCOUNTER — Other Ambulatory Visit (HOSPITAL_COMMUNITY): Payer: Self-pay | Admitting: Oncology

## 2012-03-14 DIAGNOSIS — I82409 Acute embolism and thrombosis of unspecified deep veins of unspecified lower extremity: Secondary | ICD-10-CM

## 2012-03-14 LAB — PROTIME-INR: INR: 3.7 — ABNORMAL HIGH (ref 0.00–1.49)

## 2012-03-14 NOTE — Progress Notes (Signed)
Labs drawn today for pt 

## 2012-03-15 ENCOUNTER — Other Ambulatory Visit: Payer: Self-pay | Admitting: *Deleted

## 2012-03-15 DIAGNOSIS — Z48812 Encounter for surgical aftercare following surgery on the circulatory system: Secondary | ICD-10-CM

## 2012-03-15 DIAGNOSIS — I739 Peripheral vascular disease, unspecified: Secondary | ICD-10-CM

## 2012-03-18 ENCOUNTER — Encounter: Payer: Self-pay | Admitting: Neurosurgery

## 2012-03-21 ENCOUNTER — Encounter (HOSPITAL_COMMUNITY): Payer: Medicare Other | Attending: Oncology

## 2012-03-21 ENCOUNTER — Ambulatory Visit (INDEPENDENT_AMBULATORY_CARE_PROVIDER_SITE_OTHER): Payer: Medicare Other | Admitting: Neurosurgery

## 2012-03-21 ENCOUNTER — Encounter: Payer: Self-pay | Admitting: Neurosurgery

## 2012-03-21 ENCOUNTER — Encounter (INDEPENDENT_AMBULATORY_CARE_PROVIDER_SITE_OTHER): Payer: Medicare Other | Admitting: *Deleted

## 2012-03-21 VITALS — BP 154/86 | HR 52 | Resp 16 | Ht 68.0 in | Wt 182.0 lb

## 2012-03-21 DIAGNOSIS — Z48812 Encounter for surgical aftercare following surgery on the circulatory system: Secondary | ICD-10-CM

## 2012-03-21 DIAGNOSIS — I739 Peripheral vascular disease, unspecified: Secondary | ICD-10-CM

## 2012-03-21 DIAGNOSIS — I82409 Acute embolism and thrombosis of unspecified deep veins of unspecified lower extremity: Secondary | ICD-10-CM

## 2012-03-21 LAB — PROTIME-INR: Prothrombin Time: 13.7 seconds (ref 11.6–15.2)

## 2012-03-21 NOTE — Progress Notes (Signed)
[facility] Office Note  CC: PAD surveillance Referring Physician: Imogene Burn  History of Present Illness: 67 year old male patient of Dr. Imogene Burn status post right femoral to above-the-knee popliteal bypass graft in April 2012. The patient denies claudication or rest pain. Patient has no open ulcerations on his lower extremities and has no pain complaints. The patient denies any new medical diagnoses or recent surgery.  Past Medical History  Diagnosis Date  . Hypertension   . Tobacco abuse   . DVT (deep venous thrombosis) 2012    Right lower ext.  Marland Kitchen GERD (gastroesophageal reflux disease)   . Renal cyst   . Renal failure     Stage 2- Stage 3  . DVT (deep venous thrombosis) 11/11/2010  . Arterial insufficiency 11/11/2010  . Renal insufficiency 11/11/2010  . Hypertension 11/11/2010  . GERD (gastroesophageal reflux disease) 11/11/2010  . 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 ETOH abuse 12/10/2010  . Folic acid deficiency 12/10/2010  . Peripheral vascular disease     ROS: [x]  Positive   [ ]  Denies    General: [ ]  Weight loss, [ ]  Fever, [ ]  chills Neurologic: [ ]  Dizziness, [ ]  Blackouts, [ ]  Seizure [ ]  Stroke, [ ]  "Mini stroke", [ ]  Slurred speech, [ ]  Temporary blindness; [ ]  weakness in arms or legs, [ ]  Hoarseness Cardiac: [ ]  Chest pain/pressure, [ ]  Shortness of breath at rest [ ]  Shortness of breath with exertion, [ ]  Atrial fibrillation or irregular heartbeat Vascular: [ ]  Pain in legs with walking, [ ]  Pain in legs at rest, [ ]  Pain in legs at night,  [ ]  Non-healing ulcer, [ ]  Blood clot in vein/DVT,   Pulmonary: [ ]  Home oxygen, [ ]  Productive cough, [ ]  Coughing up blood, [ ]  Asthma,  [ ]  Wheezing Musculoskeletal:  [ ]  Arthritis, [ ]  Low back pain, [ ]  Joint pain Hematologic: [ ]  Easy Bruising, [ ]  Anemia; [ ]  Hepatitis Gastrointestinal: [ ]  Blood in stool, [ ]  Gastroesophageal Reflux/heartburn, [ ]  Trouble swallowing Urinary: [ ]  chronic Kidney  disease, [ ]  on HD - [ ]  MWF or [ ]  TTHS, [ ]  Burning with urination, [ ]  Difficulty urinating Skin: [ ]  Rashes, [ ]  Wounds Psychological: [ ]  Anxiety, [ ]  Depression   Social History History  Substance Use Topics  . Smoking status: Current Every Day Smoker -- 1.5 packs/day for 50 years    Types: Cigarettes  . Smokeless tobacco: Never Used  . Alcohol Use: 1.2 oz/week    2 Shots of liquor per week    Family History Family History  Problem Relation Age of Onset  . Diabetes Brother   . Colon cancer Neg Hx   . Cancer Mother   . Cancer Father   . Heart disease Sister     Heart disease before age 22    No Known Allergies  Current Outpatient Prescriptions  Medication Sig Dispense Refill  . amLODipine (NORVASC) 10 MG tablet Take 10 mg by mouth daily.        . folic acid (FOLVITE) 1 MG tablet Take 1 mg by mouth daily.      . metoprolol (LOPRESSOR) 50 MG tablet Take 50 mg by mouth 2 (two) times daily.        . pantoprazole (PROTONIX) 40 MG tablet Take 1 tablet (40 mg total) by mouth daily.  30 tablet  11  . varenicline (CHANTIX) 0.5 MG tablet Take 0.5 mg by mouth  2 (two) times daily.      Marland Kitchen warfarin (COUMADIN) 1 MG tablet TAKE AS DIRECTED BY  DOCTOR  30 tablet  3  . warfarin (COUMADIN) 4 MG tablet Take 4 mg by mouth daily. 5mg  daily at present      . warfarin (COUMADIN) 5 MG tablet Take 1 tablet (5 mg total) by mouth daily.  30 tablet  3  . chlorproMAZINE (THORAZINE) 10 MG tablet Take 10 mg by mouth 4 (four) times daily.       . phytonadione (VITAMIN K) 5 MG tablet Take 2 tablets (10 mg total) by mouth once.  2 tablet  0    Physical Examination  Filed Vitals:   03/21/12 1026  BP: 154/86  Pulse: 52  Resp: 16    Body mass index is 27.67 kg/(m^2).  General:  WDWN in NAD Gait: Normal HEENT: WNL Eyes: Pupils equal Pulmonary: normal non-labored breathing , without Rales, rhonchi,  wheezing Cardiac: RRR, without  Murmurs, rubs or gallops; No carotid bruits Abdomen: soft,  NT, no masses Skin: no rashes, ulcers noted Vascular Exam/Pulses: Palpable femoral pulses, left lower extremity pulses are palpable I do not palpate right lower extremity pulses  Extremities without ischemic changes, no Gangrene , no cellulitis; no open wounds;  Musculoskeletal: no muscle wasting or atrophy  Neurologic: A&O X 3; Appropriate Affect ; SENSATION: normal; MOTOR FUNCTION:  moving all extremities equally. Speech is fluent/normal  Non-Invasive Vascular Imaging: ABIs today are 0.56 on the right which is a decline from previous exam in August 2012 when he was 0.96, he is 0.91 on the left which is consistent with previous exam. The bypass graft is occluded.  ASSESSMENT/PLAN: Asymptomatic patient did declines further intervention or diagnostics at this time. We will have the patient return for repeat ABIs in 3 months and be seen by Dr. Imogene Burn since he does have an occluded bypass. The patient again reiterates he has no rest pain or claudication. The patient's questions were encouraged and answered, he is in agreement with this plan.  Lauree Chandler ANP  Clinic M.D.: [facility]

## 2012-03-21 NOTE — Addendum Note (Signed)
Addended by: Melodye Ped C on: 03/21/2012 10:54 AM   Modules accepted: Orders

## 2012-03-21 NOTE — Progress Notes (Signed)
Labs drawn today for pt 

## 2012-03-28 ENCOUNTER — Encounter (HOSPITAL_BASED_OUTPATIENT_CLINIC_OR_DEPARTMENT_OTHER): Payer: Medicare Other

## 2012-03-28 DIAGNOSIS — I82409 Acute embolism and thrombosis of unspecified deep veins of unspecified lower extremity: Secondary | ICD-10-CM

## 2012-03-28 NOTE — Progress Notes (Signed)
Labs drawn today for pt 

## 2012-04-06 ENCOUNTER — Other Ambulatory Visit (HOSPITAL_COMMUNITY): Payer: Self-pay | Admitting: *Deleted

## 2012-04-06 ENCOUNTER — Encounter (HOSPITAL_BASED_OUTPATIENT_CLINIC_OR_DEPARTMENT_OTHER): Payer: Medicare Other

## 2012-04-06 DIAGNOSIS — I82409 Acute embolism and thrombosis of unspecified deep veins of unspecified lower extremity: Secondary | ICD-10-CM

## 2012-04-06 LAB — PROTIME-INR: Prothrombin Time: 21.3 seconds — ABNORMAL HIGH (ref 11.6–15.2)

## 2012-04-06 NOTE — Progress Notes (Signed)
Labs drawn today for pt 

## 2012-04-06 NOTE — Progress Notes (Signed)
On 12/30 his INR was 3.7--held x 2 days then 4mg  daily On Jan 6th he was 1.06 and continued 4 mg daily Jan 13th INR was 1.9 and he continued 4 mg daily.

## 2012-04-14 ENCOUNTER — Encounter (HOSPITAL_BASED_OUTPATIENT_CLINIC_OR_DEPARTMENT_OTHER): Payer: Medicare Other

## 2012-04-14 DIAGNOSIS — I82409 Acute embolism and thrombosis of unspecified deep veins of unspecified lower extremity: Secondary | ICD-10-CM

## 2012-04-14 LAB — PROTIME-INR: Prothrombin Time: 26.1 seconds — ABNORMAL HIGH (ref 11.6–15.2)

## 2012-04-14 NOTE — Progress Notes (Signed)
Labs drawn today for pt 

## 2012-04-18 ENCOUNTER — Telehealth: Payer: Self-pay | Admitting: Vascular Surgery

## 2012-04-18 ENCOUNTER — Other Ambulatory Visit: Payer: Self-pay | Admitting: Internal Medicine

## 2012-04-18 ENCOUNTER — Telehealth: Payer: Self-pay

## 2012-04-18 NOTE — Telephone Encounter (Addendum)
Message copied by Shari Prows on Mon Apr 18, 2012  4:37 PM ------      Message from: Phillips Odor      Created: Mon Apr 18, 2012  3:07 PM      Regarding: FW: office appt. sooner than 06/2012       Please schedule next available appt./ no vasc. studies      ----- Message -----         From: Fransisco Hertz, MD         Sent: 04/18/2012   2:14 PM           To: Conley Simmonds Pullins, RN      Subject: RE: office appt. sooner than 06/2012                      Pt can follow at next available regular appointment.  Don't need to repeat studies.            ----- Message -----         From: Erenest Blank, RN         Sent: 04/18/2012   1:31 PM           To: Fransisco Hertz, MD      Subject: office appt. sooner than 06/2012                          Please advise if you think pt. Needs to be seen sooner than 06/2012; need to repeat labs?            Per my triage note:      Pt. Called to report that every time it rains, his right leg hurts.  Questioned if the joints hurt or the muscle hurt; pt. Stated that the joints hurt.  Last office visit in 03/2012 showed decreased ABI's of right leg.  Questioned pt. If he had any change in sensation of right LE; denied any numbness or tingling.  Questioned if he had rest pain; pt. denied rest pain.  Questioned if any pain with walking; stated he has pain in right lower extremity with walking approx. 1/4-1/2 mi.  Denies any open sores.  States his pain is not any different since last office evaluation.  Will discuss w/ Dr. Imogene Burn            I scheduled an appt for the above pt on 04/29/12 at 11:15am w/ [facility]. The pt's wife is aware/awt

## 2012-04-18 NOTE — Telephone Encounter (Signed)
Pt. Called to report that every time it rains, his right leg hurts.  Questioned if the joints hurt or the muscle hurt; pt. Stated that the joints hurt.  Last office visit in 03/2012 showed decreased ABI's of right leg.  Questioned pt. If he had any change in sensation of right LE; denied any numbness or tingling.  Questioned if he had rest pain; pt. denied rest pain.  Questioned if any pain with walking; stated he has pain in right lower extremity with walking approx. 1/4-1/2 mi.  Denies any open sores.  States his pain is not any different since last office evaluation.  Will discuss w/ Dr. Imogene Burn.

## 2012-04-18 NOTE — Telephone Encounter (Signed)
From: Fransisco Hertz, MD Sent: 04/18/2012 2:14 PM  To: Conley Simmonds Cabrina Shiroma, RN  Subject: RE: office appt. sooner than 06/2012   Pt can follow at next available regular appointment. Don't need to repeat studies.

## 2012-04-21 ENCOUNTER — Other Ambulatory Visit (HOSPITAL_COMMUNITY): Payer: Self-pay | Admitting: Oncology

## 2012-04-21 ENCOUNTER — Encounter (HOSPITAL_COMMUNITY): Payer: Medicare Other | Attending: Oncology

## 2012-04-21 ENCOUNTER — Other Ambulatory Visit (HOSPITAL_COMMUNITY): Payer: Medicare Other

## 2012-04-21 DIAGNOSIS — E7210 Disorders of sulfur-bearing amino-acid metabolism, unspecified: Secondary | ICD-10-CM | POA: Insufficient documentation

## 2012-04-21 DIAGNOSIS — I82409 Acute embolism and thrombosis of unspecified deep veins of unspecified lower extremity: Secondary | ICD-10-CM

## 2012-04-21 LAB — PROTIME-INR: INR: 4.11 — ABNORMAL HIGH (ref 0.00–1.49)

## 2012-04-21 NOTE — Progress Notes (Signed)
Hold x 2 days. Restart at 4 mg daily. INR on Monday. 2/10 @ 10:10 for INR. Patient's family member repeated instructions and verbalized understanding.

## 2012-04-21 NOTE — Progress Notes (Signed)
Labs drawn today for pt 

## 2012-04-25 ENCOUNTER — Encounter (HOSPITAL_BASED_OUTPATIENT_CLINIC_OR_DEPARTMENT_OTHER): Payer: Medicare Other

## 2012-04-25 ENCOUNTER — Other Ambulatory Visit (HOSPITAL_COMMUNITY): Payer: Self-pay | Admitting: Oncology

## 2012-04-25 ENCOUNTER — Telehealth (HOSPITAL_COMMUNITY): Payer: Self-pay | Admitting: Oncology

## 2012-04-25 DIAGNOSIS — I82409 Acute embolism and thrombosis of unspecified deep veins of unspecified lower extremity: Secondary | ICD-10-CM

## 2012-04-25 DIAGNOSIS — E7211 Homocystinuria: Secondary | ICD-10-CM

## 2012-04-25 LAB — CBC WITH DIFFERENTIAL/PLATELET
Basophils Absolute: 0.1 10*3/uL (ref 0.0–0.1)
Basophils Relative: 1 % (ref 0–1)
Eosinophils Absolute: 0.1 10*3/uL (ref 0.0–0.7)
Eosinophils Relative: 2 % (ref 0–5)
HCT: 38.8 % — ABNORMAL LOW (ref 39.0–52.0)
Hemoglobin: 12.9 g/dL — ABNORMAL LOW (ref 13.0–17.0)
Lymphocytes Relative: 27 % (ref 12–46)
Lymphs Abs: 1.3 10*3/uL (ref 0.7–4.0)
MCH: 28.7 pg (ref 26.0–34.0)
MCHC: 33.2 g/dL (ref 30.0–36.0)
MCV: 86.2 fL (ref 78.0–100.0)
Monocytes Absolute: 0.5 10*3/uL (ref 0.1–1.0)
Monocytes Relative: 10 % (ref 3–12)
Neutro Abs: 2.8 10*3/uL (ref 1.7–7.7)
Neutrophils Relative %: 60 % (ref 43–77)
Platelets: 334 10*3/uL (ref 150–400)
RBC: 4.5 MIL/uL (ref 4.22–5.81)
RDW: 15.4 % (ref 11.5–15.5)
WBC: 4.6 10*3/uL (ref 4.0–10.5)

## 2012-04-25 LAB — COMPREHENSIVE METABOLIC PANEL
ALT: 7 U/L (ref 0–53)
Alkaline Phosphatase: 84 U/L (ref 39–117)
BUN: 9 mg/dL (ref 6–23)
CO2: 23 mEq/L (ref 19–32)
Chloride: 101 mEq/L (ref 96–112)
GFR calc Af Amer: 57 mL/min — ABNORMAL LOW (ref >90)
GFR calc non Af Amer: 50 mL/min — ABNORMAL LOW (ref >90)
Glucose, Bld: 139 mg/dL — ABNORMAL HIGH (ref 70–99)
Potassium: 4.1 mEq/L (ref 3.5–5.1)
Sodium: 137 mEq/L (ref 135–145)
Total Bilirubin: 0.2 mg/dL — ABNORMAL LOW (ref 0.3–1.2)

## 2012-04-25 LAB — PROTIME-INR: Prothrombin Time: 39.6 seconds — ABNORMAL HIGH (ref 11.6–15.2)

## 2012-04-25 LAB — HOMOCYSTEINE: Homocysteine: 26 umol/L — ABNORMAL HIGH (ref 4.0–15.4)

## 2012-04-25 MED ORDER — WARFARIN SODIUM 4 MG PO TABS: 4.0000 mg | ORAL_TABLET | Freq: Every day | ORAL | Status: DC

## 2012-04-25 NOTE — Telephone Encounter (Signed)
Needs Coumadin refill.  Has 4 tablets left.

## 2012-04-25 NOTE — Progress Notes (Signed)
Justin Gilmore's reason for visit today are for labs as scheduled per MD orders.  Venipuncture performed with a 23 gauge butterfly needle to R Antecubital.  Justin Gilmore tolerated venipuncture well and without incident; questions were answered and patient was discharged.   Justin Gilmore states he is taking his coumadin at 5mg  daily.

## 2012-04-27 ENCOUNTER — Encounter: Payer: Self-pay | Admitting: Vascular Surgery

## 2012-04-27 ENCOUNTER — Encounter (HOSPITAL_BASED_OUTPATIENT_CLINIC_OR_DEPARTMENT_OTHER): Payer: Medicare Other

## 2012-04-27 DIAGNOSIS — I82409 Acute embolism and thrombosis of unspecified deep veins of unspecified lower extremity: Secondary | ICD-10-CM

## 2012-04-27 NOTE — Progress Notes (Signed)
Labs drawn today for pt 

## 2012-04-29 ENCOUNTER — Ambulatory Visit: Payer: Medicare Other | Admitting: Vascular Surgery

## 2012-05-04 ENCOUNTER — Encounter: Payer: Self-pay | Admitting: Internal Medicine

## 2012-05-04 ENCOUNTER — Encounter (HOSPITAL_COMMUNITY): Payer: Medicare Other

## 2012-05-04 DIAGNOSIS — I82409 Acute embolism and thrombosis of unspecified deep veins of unspecified lower extremity: Secondary | ICD-10-CM

## 2012-05-04 LAB — PROTIME-INR: INR: 2.23 — ABNORMAL HIGH (ref 0.00–1.49)

## 2012-05-04 NOTE — Progress Notes (Signed)
Labs drawn today for pt 

## 2012-05-05 ENCOUNTER — Ambulatory Visit (INDEPENDENT_AMBULATORY_CARE_PROVIDER_SITE_OTHER): Payer: Medicare Other | Admitting: Urgent Care

## 2012-05-05 ENCOUNTER — Encounter: Payer: Self-pay | Admitting: Urgent Care

## 2012-05-05 ENCOUNTER — Encounter: Payer: Self-pay | Admitting: Neurosurgery

## 2012-05-05 VITALS — BP 125/72 | HR 66 | Temp 97.8°F | Ht 68.0 in | Wt 186.2 lb

## 2012-05-05 DIAGNOSIS — D509 Iron deficiency anemia, unspecified: Secondary | ICD-10-CM

## 2012-05-05 DIAGNOSIS — K219 Gastro-esophageal reflux disease without esophagitis: Secondary | ICD-10-CM

## 2012-05-05 NOTE — Progress Notes (Signed)
Faxed to PCP

## 2012-05-05 NOTE — Assessment & Plan Note (Signed)
Well controlled on Protonix 40 mg daily.  She tolerated gastritis status post treatment on EGD last year. He is doing very well. He has no GI complaints today.  1-800-QUIT-NOW for help quitting smoking Office visit in one year with Dr. Jena Gauss  Followup with your regular doctor regarding your leg cramps Followup with Dr. Mariel Sleet as planned

## 2012-05-05 NOTE — Assessment & Plan Note (Signed)
Hemoglobin 12.9.  He is to followup with his physician regarding Coumadin management.

## 2012-05-05 NOTE — Progress Notes (Signed)
Referring Provider: [name], MD Primary Care Physician:  [name], MD Primary Gastroenterologist:  Dr. Jena Gauss  Chief Complaint  Patient presents with  . Follow-up    IDA/ h pylori    HPI:  Justin Gilmore is a 67 y.o. male here for follow up for iron deficiency anemia.  February 2013, he underwent EGD and colonoscopy by Dr. Jena Gauss for iron deficiency anemia. He is found to have H. pylori gastritis, peptic duodenitis, and a tubular adenoma was removed from the splenic flexure as well as a hyperplastic polyp. He is status post treatment for H. pylori gastritis. He did he has done very well. He is on chronic Coumadin. He denies any rectal bleeding, melena, or abdominal pain. He denies any problems with acid reflux. Hx of right lower extremity DVT.  Recent hemoglobin was almost normal.    Results for orders placed in visit on 05/04/12 (from the past 672 hour(s))  PROTIME-INR   Collection Time    05/04/12  9:08 AM      Result Value Range   Prothrombin Time 23.7 (*) 11.6 - 15.2 seconds   INR 2.23 (*) 0.00 - 1.49  Results for orders placed in visit on 04/27/12 (from the past 672 hour(s))  PROTIME-INR   Collection Time    04/27/12  9:00 AM      Result Value Range   Prothrombin Time 25.8 (*) 11.6 - 15.2 seconds   INR 2.50 (*) 0.00 - 1.49  Results for orders placed in visit on 04/25/12 (from the past 672 hour(s))  PROTIME-INR   Collection Time    04/25/12  9:46 AM      Result Value Range   Prothrombin Time 39.6 (*) 11.6 - 15.2 seconds   INR 4.45 (*) 0.00 - 1.49  CBC WITH DIFFERENTIAL   Collection Time    04/25/12  9:46 AM      Result Value Range   WBC 4.6  4.0 - 10.5 K/uL   RBC 4.50  4.22 - 5.81 MIL/uL   Hemoglobin 12.9 (*) 13.0 - 17.0 g/dL   HCT 16.1 (*) 09.6 - 04.5 %   MCV 86.2  78.0 - 100.0 fL   MCH 28.7  26.0 - 34.0 pg   MCHC 33.2  30.0 - 36.0 g/dL   RDW 40.9  81.1 - 91.4 %   Platelets 334  150 - 400 K/uL   Neutrophils Relative 60  43 - 77 %   Neutro Abs 2.8   1.7 - 7.7 K/uL   Lymphocytes Relative 27  12 - 46 %   Lymphs Abs 1.3  0.7 - 4.0 K/uL   Monocytes Relative 10  3 - 12 %   Monocytes Absolute 0.5  0.1 - 1.0 K/uL   Eosinophils Relative 2  0 - 5 %   Eosinophils Absolute 0.1  0.0 - 0.7 K/uL   Basophils Relative 1  0 - 1 %   Basophils Absolute 0.1  0.0 - 0.1 K/uL  COMPREHENSIVE METABOLIC PANEL   Collection Time    04/25/12  9:46 AM      Result Value Range   Sodium 137  135 - 145 mEq/L   Potassium 4.1  3.5 - 5.1 mEq/L   Chloride 101  96 - 112 mEq/L   CO2 23  19 - 32 mEq/L   Glucose, Bld 139 (*) 70 - 99 mg/dL   BUN 9  6 - 23 mg/dL   Creatinine, Ser 7.82 (*) 0.50 - 1.35 mg/dL  Calcium 9.7  8.4 - 10.5 mg/dL   Total Protein 7.5  6.0 - 8.3 g/dL   Albumin 3.1 (*) 3.5 - 5.2 g/dL   AST 13  0 - 37 U/L   ALT 7  0 - 53 U/L   Alkaline Phosphatase 84  39 - 117 U/L   Total Bilirubin 0.2 (*) 0.3 - 1.2 mg/dL   GFR calc non Af Amer 50 (*) >90 mL/min   GFR calc Af Amer 57 (*) >90 mL/min  HOMOCYSTEINE   Collection Time    04/25/12  9:46 AM      Result Value Range   Homocysteine 26.0 (*) 4.0 - 15.4 umol/L  Results for orders placed in visit on 04/21/12 (from the past 672 hour(s))  PROTIME-INR   Collection Time    04/21/12  9:22 AM      Result Value Range   Prothrombin Time 37.3 (*) 11.6 - 15.2 seconds   INR 4.11 (*) 0.00 - 1.49  Results for orders placed in visit on 04/14/12 (from the past 672 hour(s))  PROTIME-INR   Collection Time    04/14/12  8:54 AM      Result Value Range   Prothrombin Time 26.1 (*) 11.6 - 15.2 seconds   INR 2.54 (*) 0.00 - 1.49       Past Medical History  Diagnosis Date  . Hypertension   . Tobacco abuse   . DVT (deep venous thrombosis) 2012    Right lower ext.  Marland Kitchen GERD (gastroesophageal reflux disease)   . Renal cyst   . Renal failure     Stage 2- Stage 3  . DVT (deep venous thrombosis) 11/11/2010  . Arterial insufficiency 11/11/2010  . Renal insufficiency 11/11/2010  . Hypertension 11/11/2010  . GERD  (gastroesophageal reflux disease) 11/11/2010  . 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 ETOH abuse 12/10/2010  . Folic acid deficiency 12/10/2010  . Peripheral vascular disease   . Tubular adenoma of colon 2012    Past Surgical History  Procedure Laterality Date  . 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 Leonides Sake  . Back surgery    . Colonoscopy   04/22/2011    [id number] colonic polyps - status post multiple snare polypectomies.  1 tubular adenoma, 1 hyperplastic colonic diverticulosis. Internal hemorrhoids  . Esophagogastroduodenoscopy  04/22/2011    RMR: H. pylori gastritis, peptic duodenitis, erosive reflux esophagitis; hiatal hernia    Current Outpatient Prescriptions  Medication Sig Dispense Refill  . amLODipine (NORVASC) 10 MG tablet Take 10 mg by mouth daily.        . folic acid (FOLVITE) 1 MG tablet Take 1 mg by mouth daily.      Marland Kitchen HYDROcodone-acetaminophen (NORCO/VICODIN) 5-325 MG per tablet Take 1 tablet by mouth every 6 (six) hours as needed.      . metoprolol (LOPRESSOR) 50 MG tablet Take 50 mg by mouth 2 (two) times daily.        . pantoprazole (PROTONIX) 40 MG tablet TAKE ONE TABLET BY MOUTH EVERY DAY  30 tablet  11  . warfarin (COUMADIN) 1 MG tablet TAKE AS DIRECTED BY  DOCTOR  30 tablet  3  . warfarin (COUMADIN) 4 MG tablet Take 1 tablet (4 mg total) by mouth daily. 5mg  daily at present  30 tablet  2  . warfarin (COUMADIN) 5 MG tablet Take 1 tablet (5 mg total) by mouth daily.  30 tablet  3  . phytonadione (VITAMIN K)  5 MG tablet Take 2 tablets (10 mg total) by mouth once.  2 tablet  0   No current facility-administered medications for this visit.    Allergies as of 05/05/2012  . (No Known Allergies)    Review of Systems: Gen: Denies any fever, chills, sweats, anorexia, fatigue, weakness, malaise, weight loss, and sleep disorder CV: Denies chest pain, angina, palpitations, syncope, orthopnea, PND, peripheral  edema, and claudication. Resp: Denies dyspnea at rest, dyspnea with exercise, cough, sputum, wheezing, coughing up blood, and pleurisy. GI: Denies vomiting blood, jaundice, and fecal incontinence.   Denies dysphagia or odynophagia. Derm: Denies rash, itching, dry skin, hives, moles, warts, or unhealing ulcers.  Psych: Denies depression, anxiety, memory loss, suicidal ideation, hallucinations, paranoia, and confusion. Heme: Denies bruising, bleeding, and enlarged lymph nodes.  Physical Exam: BP 125/72  Pulse 66  Temp(Src) 97.8 F (36.6 C) (Oral)  Ht 5\' 8"  (1.727 m)  Wt 186 lb 3.2 oz (84.46 kg)  BMI 28.32 kg/m2 General:   Alert,  Well-developed, well-nourished, pleasant and cooperative in NAD.  He is accompanied by his girlfriend. Eyes:  Sclera clear, no icterus.   Conjunctiva pink. Mouth:  No deformity or lesions, oropharynx pink and moist. Neck:  Supple; no masses or thyromegaly. Heart:  Regular rate and rhythm; no murmurs, clicks, rubs,  or gallops. Abdomen:  Normal bowel sounds.  No bruits.  Soft, non-tender and non-distended without masses, hepatosplenomegaly or hernias noted.  No guarding or rebound tenderness.   Rectal:  Deferred. Msk:  Symmetrical without gross deformities.  Pulses:  Normal pulses noted. Extremities: + Clubbing. No edema. Neurologic:  Alert and oriented x4;  grossly normal neurologically. Skin:  Intact without significant lesions or rashes.

## 2012-05-05 NOTE — Patient Instructions (Addendum)
Continue Protonix 40 mg daily 1-800-QUIT-NOW for help quitting smoking Office visit in one year with Dr. Jena Gauss Followup with your regular doctor regarding your leg cramps Followup with Dr. Mariel Sleet as planned

## 2012-05-06 ENCOUNTER — Encounter (HOSPITAL_BASED_OUTPATIENT_CLINIC_OR_DEPARTMENT_OTHER): Payer: Medicare Other | Admitting: Oncology

## 2012-05-06 ENCOUNTER — Encounter (HOSPITAL_COMMUNITY): Payer: Self-pay | Admitting: Oncology

## 2012-05-06 ENCOUNTER — Ambulatory Visit (INDEPENDENT_AMBULATORY_CARE_PROVIDER_SITE_OTHER): Payer: Medicare Other | Admitting: Neurosurgery

## 2012-05-06 ENCOUNTER — Encounter: Payer: Self-pay | Admitting: Neurosurgery

## 2012-05-06 VITALS — BP 139/69 | HR 56 | Temp 98.4°F | Resp 16 | Wt 187.8 lb

## 2012-05-06 VITALS — BP 162/90 | HR 62 | Resp 16 | Ht 68.0 in | Wt 187.0 lb

## 2012-05-06 DIAGNOSIS — F17200 Nicotine dependence, unspecified, uncomplicated: Secondary | ICD-10-CM

## 2012-05-06 DIAGNOSIS — I739 Peripheral vascular disease, unspecified: Secondary | ICD-10-CM

## 2012-05-06 DIAGNOSIS — M25579 Pain in unspecified ankle and joints of unspecified foot: Secondary | ICD-10-CM

## 2012-05-06 DIAGNOSIS — I70219 Atherosclerosis of native arteries of extremities with intermittent claudication, unspecified extremity: Secondary | ICD-10-CM

## 2012-05-06 DIAGNOSIS — M25571 Pain in right ankle and joints of right foot: Secondary | ICD-10-CM | POA: Insufficient documentation

## 2012-05-06 DIAGNOSIS — I82409 Acute embolism and thrombosis of unspecified deep veins of unspecified lower extremity: Secondary | ICD-10-CM

## 2012-05-06 DIAGNOSIS — F1010 Alcohol abuse, uncomplicated: Secondary | ICD-10-CM

## 2012-05-06 NOTE — Patient Instructions (Addendum)
[facility] Discharge Instructions  RECOMMENDATIONS MADE BY THE CONSULTANT AND ANY TEST RESULTS WILL BE SENT TO YOUR REFERRING PHYSICIAN.  EXAM FINDINGS BY THE PHYSICIAN TODAY AND SIGNS OR SYMPTOMS TO REPORT TO CLINIC OR PRIMARY PHYSICIAN: Exam and discussion by MD.  Justin Gilmore can stop the coumadin.  Talk with your vascular doctor about taking a coated baby aspirin and about the cramping in your left leg.  MEDICATIONS PRESCRIBED:  Stop the coumadin (warfarin) Continue the Folic Acid  - life long Start a B Complex Vitamin and take 1 daily  INSTRUCTIONS GIVEN AND DISCUSSED: Call with swelling of extremities or other problems  SPECIAL INSTRUCTIONS/FOLLOW-UP: Return in 4 months for blood work and to be seen.  Thank you for choosing [facility] to provide your oncology and hematology care.  To afford each patient quality time with our providers, please arrive at least 15 minutes before your scheduled appointment time.  With your help, our goal is to use those 15 minutes to complete the necessary work-up to ensure our physicians have the information they need to help with your evaluation and healthcare recommendations.    Effective January 1st, 2014, we ask that you re-schedule your appointment with our physicians should you arrive 10 or more minutes late for your appointment.  We strive to give you quality time with our providers, and arriving late affects you and other patients whose appointments are after yours.    Again, thank you for choosing [facility].  Our hope is that these requests will decrease the amount of time that you wait before being seen by our physicians.       _____________________________________________________________  Should you have questions after your visit to [facility], please contact our office at [phone] between the hours of 8:30 a.m. and 5:00 p.m.  Voicemails left after 4:30 p.m. will not be returned  until the following business day.  For prescription refill requests, have your pharmacy contact our office with your prescription refill request.

## 2012-05-06 NOTE — Progress Notes (Signed)
Subjective:     Patient ID: Justin Gilmore, male   DOB: 1946/02/21, 67 y.o.   MRN: [id number]  HPI: 67 year old male patient of Dr. Imogene Burn who has a known occluded right femoropopliteal bypass graft that was placed in 2012. The patient was seen last month with repeat followup ABIs which were unchanged from previous exam on left and had declined on the right however the patient declines any further diagnostic or intervention. The patient's wife called stating he was having right leg pain which has resolved and turned out to be a carbuncle that was on his right buttock. The patient does have some complaints of right ankle pain which is probable arthritis. The patient still declines any further vascular diagnostics or intervention.   Review of Systems: 12 point review of systems is notable for the difficulties described above otherwise unremarkable     Objective:   Physical Exam: Afebrile, vital signs are stable, palpable left lower extremity pulses, right lower extremity pulses are not palpable, he does have pain in his ankle around the malleolus with manipulation     Assessment:     Assessment as above    Plan:     The patient will followup here as scheduled with repeat ABIs in the future. He was referred to his primary care Dr. and possibly an orthopedist, he and his wife are in agreement with this, their questions were encouraged and answered.  Lauree Chandler ANP  Clinic M.D.: Imogene Burn

## 2012-05-06 NOTE — Progress Notes (Signed)
#  1 DVT of the right leg now status post presentation in August 2012. He has been on Coumadin since with wide swings in his INR is. We have been concerned that his alcohol use has been part of this issue. Nevertheless I think she has had 12 full months of adequate/therapeutic anticoagulation since we started. I think it's time to quit however. 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 We had gotten down to 17 but more recently was 25. I want him to continue his folic acid 1 mg twice a day but I think we also need to give him a B complex multivitamin from over-the-counter which she can take once a day as well. We will see him back in 4 months with repeat homocystine value and CBC.  #2 peripheral arterial vascular disease and his vascular surgeon may want to put him on aspirin at this point which would be fine from my standpoint though I think it is controversial as to whether truly helps prevent venous DVT. #3 ongoing alcohol use #4 ongoing cigarette smoking #5 folic acid deficiency at presentation #6 iron deficiency anemia presentation #7 hypertension  The plan is to see him back in 4 months with lab work first

## 2012-05-18 ENCOUNTER — Other Ambulatory Visit (HOSPITAL_COMMUNITY): Payer: Medicare Other

## 2012-06-23 ENCOUNTER — Encounter: Payer: Self-pay | Admitting: Vascular Surgery

## 2012-06-24 ENCOUNTER — Encounter: Payer: Self-pay | Admitting: Vascular Surgery

## 2012-06-24 ENCOUNTER — Ambulatory Visit (INDEPENDENT_AMBULATORY_CARE_PROVIDER_SITE_OTHER): Payer: Medicare Other | Admitting: Vascular Surgery

## 2012-06-24 ENCOUNTER — Encounter (INDEPENDENT_AMBULATORY_CARE_PROVIDER_SITE_OTHER): Payer: Medicare Other | Admitting: *Deleted

## 2012-06-24 VITALS — BP 151/77 | HR 70 | Ht 68.0 in | Wt 180.9 lb

## 2012-06-24 DIAGNOSIS — I70219 Atherosclerosis of native arteries of extremities with intermittent claudication, unspecified extremity: Secondary | ICD-10-CM

## 2012-06-24 DIAGNOSIS — Z48812 Encounter for surgical aftercare following surgery on the circulatory system: Secondary | ICD-10-CM

## 2012-06-24 DIAGNOSIS — I739 Peripheral vascular disease, unspecified: Secondary | ICD-10-CM

## 2012-06-24 NOTE — Addendum Note (Signed)
Addended by: Sharee Pimple on: 06/24/2012 12:32 PM   Modules accepted: Orders

## 2012-06-24 NOTE — Progress Notes (Signed)
[facility] [place]  Established Previous Bypass  History of Present Illness  Justin Gilmore is a 67 y.o. (08-Mar-1946) male who presents with chief complaint: routine follow.  Previous operation(s) include: R CFA EA w/ BPA and extended profundaplasty, R CFA to AK BPG w/ Propaten.  This procedure was done for extensive ulcers in R leg.  Since then the wound have healed and the bypass has clotted.  The patient's symptoms have not progressed.  The patient's symptoms are: non-lifestyle altering claudication. The patient's treatment regimen currently included: maximal medical management.  Past Medical History, Past Surgical History, Social History, Family History, Medications, Allergies, and Review of Systems are unchanged from previous evaluation on 05/06/12.  Physical Examination  Filed Vitals:   06/24/12 0935  BP: 151/77  Pulse: 70  Height: 5\' 8"  (1.727 m)  Weight: 180 lb 14.4 oz (82.056 kg)  SpO2: 100%   Body mass index is 27.51 kg/(m^2).  General: A&O x 3, WDWN  Pulmonary: Sym exp, good air movt, CTAB, no rales, rhonchi, & wheezing  Cardiac: RRR, Nl S1, S2, no Murmurs, rubs or gallops  Vascular: Vessel Right Left  Radial Palpable Palpable  Brachial Palpable Palpable  Carotid Palpable, without bruit Palpable, without bruit  Aorta Non-palpable N/A  Femoral Palpable Palpable  Popliteal Non-palpable Non-palpable  PT Not Palpable Palpable  DP Not Palpable Palpable   Musculoskeletal: M/S 5/5 throughout , Extremities without ischemic changes , R leg ulcers are all healed  Neurologic: Pain and light touch intact in extremities , Motor exam as listed above  Non-Invasive Vascular Imaging ABI (Date: 06/24/12)  R: 0.50 (0.56), DP: Mono, PT: Mono  L: 0.96 (0.91), DP: Bi, PT: Bi  Medical Decision Making  Justin Gilmore is a 67 y.o. male who presents with: non-lifestyle limiting claudication    Patient continues to smoke which I counseled him with adversely  affect his vasculature in the future.  He was not interested in smoking cessation.  He recent had his Warfarin stopped, so I would start ASA 81 mg PO qd, as he is on a PPI which would interfere with Plavix.  I also discussed starting a statin on him, as there are not multiple studies demonstrated improved PAD outcomes on statin.    His wife want to defer that decision to his PCP.  Based on the patient's vascular studies and examination, I have offered the patient: continued annual surveillance with ABI  I discussed in depth with the patient the nature of atherosclerosis, and emphasized the importance of maximal medical management including strict control of blood pressure, blood glucose, and lipid levels, obtaining regular exercise, and cessation of smoking.  The patient is aware that without maximal medical management the underlying atherosclerotic disease process will progress, limiting the benefit of any interventions.  I discussed in depth with the patient the need for long term surveillance to improve the primary assisted patency of his bypass.  The patient agrees to cooperate with such.  The patient is scheduled for the previous listed surveillance studies in 12 months.  Thank you for allowing [place] to participate in this patient's care.  Leonides Sake, MD [facility] [place] Office: [phone] Pager: [phone]  06/24/2012, 10:05 AM

## 2012-08-29 ENCOUNTER — Other Ambulatory Visit (HOSPITAL_COMMUNITY): Payer: Medicare Other

## 2012-09-01 ENCOUNTER — Encounter (HOSPITAL_COMMUNITY): Payer: Medicare Other

## 2012-09-13 ENCOUNTER — Other Ambulatory Visit (HOSPITAL_COMMUNITY): Payer: Medicare Other

## 2012-09-15 ENCOUNTER — Encounter (HOSPITAL_BASED_OUTPATIENT_CLINIC_OR_DEPARTMENT_OTHER): Payer: Medicare Other

## 2012-09-15 ENCOUNTER — Encounter (HOSPITAL_COMMUNITY): Payer: Medicare Other | Attending: Oncology | Admitting: Oncology

## 2012-09-15 DIAGNOSIS — I82409 Acute embolism and thrombosis of unspecified deep veins of unspecified lower extremity: Secondary | ICD-10-CM | POA: Insufficient documentation

## 2012-09-15 LAB — CBC
HCT: 39.4 % (ref 39.0–52.0)
Hemoglobin: 13.1 g/dL (ref 13.0–17.0)
MCV: 88.5 fL (ref 78.0–100.0)
WBC: 6.4 10*3/uL (ref 4.0–10.5)

## 2012-09-15 NOTE — Progress Notes (Signed)
-  No show, letter sent-   

## 2012-09-15 NOTE — Progress Notes (Signed)
Labs drawn today for cbc,homocysteine

## 2013-05-05 ENCOUNTER — Ambulatory Visit: Payer: Medicare Other | Admitting: Internal Medicine

## 2013-05-05 ENCOUNTER — Telehealth: Payer: Self-pay | Admitting: Internal Medicine

## 2013-05-05 NOTE — Telephone Encounter (Signed)
Pt was a no show

## 2013-06-29 ENCOUNTER — Encounter: Payer: Self-pay | Admitting: Vascular Surgery

## 2013-06-30 ENCOUNTER — Ambulatory Visit (HOSPITAL_COMMUNITY)
Admission: RE | Admit: 2013-06-30 | Discharge: 2013-06-30 | Disposition: A | Discharge status: Home / Self Care | Payer: Medicare Other | Source: Ambulatory Visit | Attending: Vascular Surgery | Admitting: Vascular Surgery

## 2013-06-30 ENCOUNTER — Encounter: Payer: Self-pay | Admitting: Vascular Surgery

## 2013-06-30 ENCOUNTER — Ambulatory Visit (INDEPENDENT_AMBULATORY_CARE_PROVIDER_SITE_OTHER): Payer: Medicare Other | Admitting: Vascular Surgery

## 2013-06-30 VITALS — BP 156/72 | HR 55 | Ht 68.0 in | Wt 175.0 lb

## 2013-06-30 DIAGNOSIS — I70219 Atherosclerosis of native arteries of extremities with intermittent claudication, unspecified extremity: Secondary | ICD-10-CM | POA: Diagnosis not present

## 2013-06-30 DIAGNOSIS — Z48812 Encounter for surgical aftercare following surgery on the circulatory system: Secondary | ICD-10-CM | POA: Insufficient documentation

## 2013-06-30 NOTE — Progress Notes (Signed)
    Established Previous Bypass  History of Present Illness  Justin Gilmore is a 68 y.o. (July 03, 1945) male who presents with chief complaint: Right ankle pain.  Previous operation(s) complete on 06/20/10: R CFA EA w/ BPA w/ ext. Profundaplasty, R CFA to AK BPG w/ propaten.  The patient's symptoms have not progressed.  The patient's symptoms are: right ankle pain after walking ~0.5 mile. The patient's sx have elements of intermittent claudication and osteoarthritis.The patient's treatment regimen currently included: maximal medical management.   The patient's PMH, PSH, SH, FamHx, Med, and Allergies are unchanged from 06/24/12.  On ROS today: + R ankle pain, no rest pain, no ulcer, no gangrene  Physical Examination  Filed Vitals:   06/30/13 1030  BP: 156/72  Pulse: 55  Height: 5\' 8"  (1.727 m)  Weight: 175 lb (79.379 kg)  SpO2: 100%   Body mass index is 26.61 kg/(m^2).  Physical Examination  Filed Vitals:   06/30/13 1030  BP: 156/72  Pulse: 55  Height: 5\' 8"  (1.727 m)  Weight: 175 lb (79.379 kg)  SpO2: 100%   Body mass index is 26.61 kg/(m^2).  General: A&O x 3, WDWN  Pulmonary: Sym exp, good air movt, CTAB, no rales, rhonchi, & wheezing  Cardiac: RRR, Nl S1, S2, no Murmurs, rubs or gallops  Vascular: Vessel Right Left  Radial Palpable Palpable  Brachial Palpable Palpable  Carotid Palpable, without bruit Palpable, without bruit  Aorta Not palpable N/A  Femoral Palpable Palpable  Popliteal Not palpable Not palpable  PT Not Palpable Faintly Palpable  DP Not Palpable Faintly Palpable   Gastrointestinal: soft, NTND, -G/R, - HSM, - masses, - CVAT B  Musculoskeletal: M/S 5/5 throughout , Extremities without ischemic changes , multiple healed ulcers, both feet lukewarm  Neurologic: Pain and light touch intact in extremities , Motor exam as listed above  Non-Invasive Vascular Imaging ABI (Date: 06/30/2013) R: 0.54 (0.50), DP: mono, PT: mono, TBI: 0.37 L: 0.98 (0.96), DP:  bi, PT: bi, TBI: 0.76  Medical Decision Making  Justin Gilmore is a 68 y.o. 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  I suspect this patient's sx are partially osteoarthritis.  With > 0.5 mi ambulation, I doubt any benefit to intervention in this in this patient.  I would manage his intermittent claudication with maximal medical management and walking plan.  Based on the patient's vascular studies and examination, I have offered the patient: annual BLE ABI  I discussed in depth with the patient the nature of atherosclerosis, and emphasized the importance of maximal medical management including strict control of blood pressure, blood glucose, and lipid levels, obtaining regular exercise, and cessation of smoking.  The patient is aware that without maximal medical management the underlying atherosclerotic disease process will progress, limiting the benefit of any interventions.  I discussed in depth with the patient the need for long term surveillance to improve the primary assisted patency of his bypass.  The patient agrees to cooperate with such.  The patient is scheduled for the previous listed surveillance studies annually.  Thank you for allowing [place] to participate in this patient's care.  Adele Barthel, MD [facility] [place] [facility] Office: [phone] Pager: [phone]  06/30/2013, 11:00 AM

## 2013-07-03 NOTE — Addendum Note (Signed)
Addended by: Dorthula Rue L on: 07/03/2013 03:28 PM   Modules accepted: Orders

## 2014-02-07 ENCOUNTER — Ambulatory Visit (HOSPITAL_COMMUNITY)
Admission: RE | Admit: 2014-02-07 | Discharge: 2014-02-07 | Disposition: A | Discharge status: Home / Self Care | Payer: Medicare Other | Source: Ambulatory Visit | Attending: Family Medicine | Admitting: Family Medicine

## 2014-02-07 ENCOUNTER — Other Ambulatory Visit (HOSPITAL_COMMUNITY): Payer: Self-pay | Admitting: Family Medicine

## 2014-02-07 DIAGNOSIS — R066 Hiccough: Secondary | ICD-10-CM

## 2014-05-21 ENCOUNTER — Encounter (HOSPITAL_COMMUNITY): Payer: Self-pay | Admitting: Emergency Medicine

## 2014-05-21 ENCOUNTER — Other Ambulatory Visit: Payer: Self-pay

## 2014-05-21 ENCOUNTER — Emergency Department (HOSPITAL_COMMUNITY): Payer: Medicare Other

## 2014-05-21 ENCOUNTER — Inpatient Hospital Stay (HOSPITAL_COMMUNITY)
Admission: EM | Admit: 2014-05-21 | Discharge: 2014-05-27 | DRG: 853 | Disposition: A | Discharge status: Home Health | Payer: Medicare Other | Attending: Internal Medicine | Admitting: Internal Medicine

## 2014-05-21 ENCOUNTER — Inpatient Hospital Stay (HOSPITAL_COMMUNITY): Payer: Medicare Other

## 2014-05-21 DIAGNOSIS — I469 Cardiac arrest, cause unspecified: Secondary | ICD-10-CM | POA: Diagnosis not present

## 2014-05-21 DIAGNOSIS — J449 Chronic obstructive pulmonary disease, unspecified: Secondary | ICD-10-CM | POA: Diagnosis present

## 2014-05-21 DIAGNOSIS — K7031 Alcoholic cirrhosis of liver with ascites: Secondary | ICD-10-CM | POA: Diagnosis present

## 2014-05-21 DIAGNOSIS — Z6828 Body mass index (BMI) 28.0-28.9, adult: Secondary | ICD-10-CM | POA: Diagnosis not present

## 2014-05-21 DIAGNOSIS — I129 Hypertensive chronic kidney disease with stage 1 through stage 4 chronic kidney disease, or unspecified chronic kidney disease: Secondary | ICD-10-CM | POA: Diagnosis present

## 2014-05-21 DIAGNOSIS — F1010 Alcohol abuse, uncomplicated: Secondary | ICD-10-CM | POA: Diagnosis present

## 2014-05-21 DIAGNOSIS — R197 Diarrhea, unspecified: Secondary | ICD-10-CM | POA: Diagnosis not present

## 2014-05-21 DIAGNOSIS — N189 Chronic kidney disease, unspecified: Secondary | ICD-10-CM

## 2014-05-21 DIAGNOSIS — L0231 Cutaneous abscess of buttock: Secondary | ICD-10-CM | POA: Diagnosis present

## 2014-05-21 DIAGNOSIS — Z86718 Personal history of other venous thrombosis and embolism: Secondary | ICD-10-CM

## 2014-05-21 DIAGNOSIS — N183 Chronic kidney disease, stage 3 (moderate): Secondary | ICD-10-CM | POA: Diagnosis present

## 2014-05-21 DIAGNOSIS — M7989 Other specified soft tissue disorders: Secondary | ICD-10-CM | POA: Diagnosis not present

## 2014-05-21 DIAGNOSIS — D696 Thrombocytopenia, unspecified: Secondary | ICD-10-CM | POA: Diagnosis present

## 2014-05-21 DIAGNOSIS — I70219 Atherosclerosis of native arteries of extremities with intermittent claudication, unspecified extremity: Secondary | ICD-10-CM | POA: Diagnosis not present

## 2014-05-21 DIAGNOSIS — K610 Anal abscess: Secondary | ICD-10-CM

## 2014-05-21 DIAGNOSIS — R188 Other ascites: Secondary | ICD-10-CM | POA: Diagnosis not present

## 2014-05-21 DIAGNOSIS — A419 Sepsis, unspecified organism: Secondary | ICD-10-CM | POA: Diagnosis present

## 2014-05-21 DIAGNOSIS — R7989 Other specified abnormal findings of blood chemistry: Secondary | ICD-10-CM | POA: Diagnosis not present

## 2014-05-21 DIAGNOSIS — I70213 Atherosclerosis of native arteries of extremities with intermittent claudication, bilateral legs: Secondary | ICD-10-CM | POA: Diagnosis present

## 2014-05-21 DIAGNOSIS — R600 Localized edema: Secondary | ICD-10-CM

## 2014-05-21 DIAGNOSIS — K219 Gastro-esophageal reflux disease without esophagitis: Secondary | ICD-10-CM | POA: Diagnosis present

## 2014-05-21 DIAGNOSIS — N178 Other acute kidney failure: Secondary | ICD-10-CM | POA: Diagnosis not present

## 2014-05-21 DIAGNOSIS — A4152 Sepsis due to Pseudomonas: Secondary | ICD-10-CM | POA: Diagnosis not present

## 2014-05-21 DIAGNOSIS — N390 Urinary tract infection, site not specified: Secondary | ICD-10-CM | POA: Insufficient documentation

## 2014-05-21 DIAGNOSIS — I10 Essential (primary) hypertension: Secondary | ICD-10-CM | POA: Diagnosis present

## 2014-05-21 DIAGNOSIS — F17210 Nicotine dependence, cigarettes, uncomplicated: Secondary | ICD-10-CM | POA: Diagnosis present

## 2014-05-21 DIAGNOSIS — R262 Difficulty in walking, not elsewhere classified: Secondary | ICD-10-CM

## 2014-05-21 DIAGNOSIS — G931 Anoxic brain damage, not elsewhere classified: Secondary | ICD-10-CM | POA: Diagnosis not present

## 2014-05-21 DIAGNOSIS — B965 Pseudomonas (aeruginosa) (mallei) (pseudomallei) as the cause of diseases classified elsewhere: Secondary | ICD-10-CM | POA: Diagnosis not present

## 2014-05-21 DIAGNOSIS — E611 Iron deficiency: Secondary | ICD-10-CM | POA: Diagnosis present

## 2014-05-21 DIAGNOSIS — D638 Anemia in other chronic diseases classified elsewhere: Secondary | ICD-10-CM | POA: Diagnosis present

## 2014-05-21 DIAGNOSIS — F419 Anxiety disorder, unspecified: Secondary | ICD-10-CM | POA: Diagnosis present

## 2014-05-21 DIAGNOSIS — D649 Anemia, unspecified: Secondary | ICD-10-CM | POA: Diagnosis present

## 2014-05-21 DIAGNOSIS — M79605 Pain in left leg: Secondary | ICD-10-CM | POA: Diagnosis present

## 2014-05-21 DIAGNOSIS — K8689 Other specified diseases of pancreas: Secondary | ICD-10-CM | POA: Insufficient documentation

## 2014-05-21 DIAGNOSIS — I739 Peripheral vascular disease, unspecified: Secondary | ICD-10-CM

## 2014-05-21 DIAGNOSIS — R6520 Severe sepsis without septic shock: Secondary | ICD-10-CM | POA: Diagnosis present

## 2014-05-21 DIAGNOSIS — N179 Acute kidney failure, unspecified: Secondary | ICD-10-CM | POA: Insufficient documentation

## 2014-05-21 DIAGNOSIS — J9601 Acute respiratory failure with hypoxia: Secondary | ICD-10-CM | POA: Diagnosis not present

## 2014-05-21 DIAGNOSIS — M8700 Idiopathic aseptic necrosis of unspecified bone: Secondary | ICD-10-CM

## 2014-05-21 DIAGNOSIS — T3695XA Adverse effect of unspecified systemic antibiotic, initial encounter: Secondary | ICD-10-CM | POA: Diagnosis not present

## 2014-05-21 DIAGNOSIS — E43 Unspecified severe protein-calorie malnutrition: Secondary | ICD-10-CM | POA: Diagnosis present

## 2014-05-21 DIAGNOSIS — Z7982 Long term (current) use of aspirin: Secondary | ICD-10-CM | POA: Diagnosis not present

## 2014-05-21 LAB — URINALYSIS, ROUTINE W REFLEX MICROSCOPIC
GLUCOSE, UA: NEGATIVE mg/dL
HGB URINE DIPSTICK: NEGATIVE
KETONES UR: NEGATIVE mg/dL
Leukocytes, UA: NEGATIVE
Nitrite: NEGATIVE
Protein, ur: NEGATIVE mg/dL
Specific Gravity, Urine: 1.01 (ref 1.005–1.030)
UROBILINOGEN UA: 0.2 mg/dL (ref 0.0–1.0)
pH: 6 (ref 5.0–8.0)

## 2014-05-21 LAB — CBC WITH DIFFERENTIAL/PLATELET
BASOS ABS: 0 10*3/uL (ref 0.0–0.1)
BASOS PCT: 0 % (ref 0–1)
Eosinophils Absolute: 0 10*3/uL (ref 0.0–0.7)
Eosinophils Relative: 0 % (ref 0–5)
HEMATOCRIT: 26.9 % — AB (ref 39.0–52.0)
Hemoglobin: 8.9 g/dL — ABNORMAL LOW (ref 13.0–17.0)
LYMPHS PCT: 10 % — AB (ref 12–46)
Lymphs Abs: 0.6 10*3/uL — ABNORMAL LOW (ref 0.7–4.0)
MCH: 30.1 pg (ref 26.0–34.0)
MCHC: 33.1 g/dL (ref 30.0–36.0)
MCV: 90.9 fL (ref 78.0–100.0)
MONO ABS: 0.7 10*3/uL (ref 0.1–1.0)
Monocytes Relative: 12 % (ref 3–12)
Neutro Abs: 4.4 10*3/uL (ref 1.7–7.7)
Neutrophils Relative %: 77 % (ref 43–77)
Platelets: 116 10*3/uL — ABNORMAL LOW (ref 150–400)
RBC: 2.96 MIL/uL — ABNORMAL LOW (ref 4.22–5.81)
RDW: 16 % — ABNORMAL HIGH (ref 11.5–15.5)
WBC: 5.7 10*3/uL (ref 4.0–10.5)

## 2014-05-21 LAB — COMPREHENSIVE METABOLIC PANEL
ALT: 38 U/L (ref 0–53)
AST: 43 U/L — AB (ref 0–37)
Albumin: 1.2 g/dL — ABNORMAL LOW (ref 3.5–5.2)
Alkaline Phosphatase: 150 U/L — ABNORMAL HIGH (ref 39–117)
Anion gap: 11 (ref 5–15)
BUN: 17 mg/dL (ref 6–23)
CO2: 18 mmol/L — ABNORMAL LOW (ref 19–32)
CREATININE: 2.17 mg/dL — AB (ref 0.50–1.35)
Calcium: 6.6 mg/dL — ABNORMAL LOW (ref 8.4–10.5)
Chloride: 109 mmol/L (ref 96–112)
GFR calc Af Amer: 34 mL/min — ABNORMAL LOW (ref >90)
GFR calc non Af Amer: 30 mL/min — ABNORMAL LOW (ref >90)
Glucose, Bld: 132 mg/dL — ABNORMAL HIGH (ref 70–99)
Potassium: 4.9 mmol/L (ref 3.5–5.1)
Sodium: 138 mmol/L (ref 135–145)
Total Bilirubin: 1.2 mg/dL (ref 0.3–1.2)
Total Protein: 4 g/dL — ABNORMAL LOW (ref 6.0–8.3)

## 2014-05-21 LAB — RETICULOCYTES
RBC.: 3.09 MIL/uL — ABNORMAL LOW (ref 4.22–5.81)
RETIC CT PCT: 1.6 % (ref 0.4–3.1)
Retic Count, Absolute: 49.4 10*3/uL (ref 19.0–186.0)

## 2014-05-21 LAB — I-STAT CG4 LACTIC ACID, ED
Lactic Acid, Venous: 6.49 mmol/L (ref 0.5–2.0)
Lactic Acid, Venous: 4.24 mmol/L (ref 0.5–2.0)

## 2014-05-21 LAB — I-STAT TROPONIN, ED: Troponin i, poc: 0.01 ng/mL (ref 0.00–0.08)

## 2014-05-21 LAB — BRAIN NATRIURETIC PEPTIDE: B Natriuretic Peptide: 701.4 pg/mL — ABNORMAL HIGH (ref 0.0–100.0)

## 2014-05-21 LAB — POC OCCULT BLOOD, ED: Fecal Occult Bld: NEGATIVE

## 2014-05-21 LAB — PROTIME-INR
INR: 1.41 (ref 0.00–1.49)
Prothrombin Time: 17.4 seconds — ABNORMAL HIGH (ref 11.6–15.2)

## 2014-05-21 MED ORDER — CLINDAMYCIN PHOSPHATE 600 MG/50ML IV SOLN
600.0000 mg | Freq: Three times a day (TID) | INTRAVENOUS | Status: DC
  Administered 2014-05-22 – 2014-05-24 (×7): 600 mg via INTRAVENOUS
  Filled 2014-05-21 (×9): qty 50

## 2014-05-21 MED ORDER — LORAZEPAM 2 MG/ML IJ SOLN: 1.0000 mg | Freq: Four times a day (QID) | INTRAMUSCULAR | Status: AC | PRN

## 2014-05-21 MED ORDER — GUAIFENESIN-DM 100-10 MG/5ML PO SYRP
5.0000 mL | ORAL_SOLUTION | ORAL | Status: DC | PRN
  Filled 2014-05-21: qty 5

## 2014-05-21 MED ORDER — VANCOMYCIN HCL IN DEXTROSE 1-5 GM/200ML-% IV SOLN
1000.0000 mg | INTRAVENOUS | Status: DC
  Administered 2014-05-22 – 2014-05-23 (×2): 1000 mg via INTRAVENOUS
  Filled 2014-05-21 (×2): qty 200

## 2014-05-21 MED ORDER — VITAMIN B-1 100 MG PO TABS
100.0000 mg | ORAL_TABLET | Freq: Every day | ORAL | Status: DC
  Administered 2014-05-23 – 2014-05-27 (×5): 100 mg via ORAL
  Filled 2014-05-21 (×7): qty 1

## 2014-05-21 MED ORDER — HEPARIN SODIUM (PORCINE) 5000 UNIT/ML IJ SOLN
5000.0000 [IU] | Freq: Three times a day (TID) | INTRAMUSCULAR | Status: DC
  Administered 2014-05-21 – 2014-05-27 (×15): 5000 [IU] via SUBCUTANEOUS
  Filled 2014-05-21 (×21): qty 1

## 2014-05-21 MED ORDER — ACETAMINOPHEN 325 MG PO TABS
650.0000 mg | ORAL_TABLET | Freq: Four times a day (QID) | ORAL | Status: DC | PRN
  Administered 2014-05-25: 650 mg via ORAL
  Filled 2014-05-21: qty 2

## 2014-05-21 MED ORDER — PIPERACILLIN-TAZOBACTAM 3.375 G IVPB
3.3750 g | Freq: Three times a day (TID) | INTRAVENOUS | Status: DC
  Administered 2014-05-22 – 2014-05-23 (×5): 3.375 g via INTRAVENOUS
  Filled 2014-05-21 (×8): qty 50

## 2014-05-21 MED ORDER — ACETAMINOPHEN 650 MG RE SUPP: 650.0000 mg | Freq: Four times a day (QID) | RECTAL | Status: DC | PRN

## 2014-05-21 MED ORDER — LORAZEPAM 2 MG/ML IJ SOLN
0.0000 mg | Freq: Four times a day (QID) | INTRAMUSCULAR | Status: AC
  Administered 2014-05-23: 1 mg via INTRAVENOUS
  Filled 2014-05-21: qty 1

## 2014-05-21 MED ORDER — FUROSEMIDE 10 MG/ML IJ SOLN
40.0000 mg | INTRAMUSCULAR | Status: AC
  Administered 2014-05-21: 40 mg via INTRAVENOUS
  Filled 2014-05-21: qty 4

## 2014-05-21 MED ORDER — ONDANSETRON HCL 4 MG PO TABS: 4.0000 mg | ORAL_TABLET | Freq: Four times a day (QID) | ORAL | Status: DC | PRN

## 2014-05-21 MED ORDER — THIAMINE HCL 100 MG/ML IJ SOLN
100.0000 mg | Freq: Every day | INTRAMUSCULAR | Status: DC
Start: 2014-05-21 — End: 2014-05-24
  Administered 2014-05-21: 100 mg via INTRAVENOUS
  Filled 2014-05-21 (×4): qty 1

## 2014-05-21 MED ORDER — MORPHINE SULFATE 2 MG/ML IJ SOLN
1.0000 mg | INTRAMUSCULAR | Status: DC | PRN
  Administered 2014-05-23 (×2): 1 mg via INTRAVENOUS
  Filled 2014-05-21 (×2): qty 1

## 2014-05-21 MED ORDER — ONDANSETRON HCL 4 MG/2ML IJ SOLN: 4.0000 mg | Freq: Four times a day (QID) | INTRAMUSCULAR | Status: DC | PRN

## 2014-05-21 MED ORDER — ADULT MULTIVITAMIN W/MINERALS CH
1.0000 | ORAL_TABLET | Freq: Every day | ORAL | Status: DC
  Administered 2014-05-21 – 2014-05-27 (×6): 1 via ORAL
  Filled 2014-05-21 (×7): qty 1

## 2014-05-21 MED ORDER — SODIUM CHLORIDE 0.9 % IV SOLN
INTRAVENOUS | Status: DC
  Administered 2014-05-21 – 2014-05-22 (×2): via INTRAVENOUS

## 2014-05-21 MED ORDER — SODIUM CHLORIDE 0.9 % IV BOLUS (SEPSIS)
1000.0000 mL | Freq: Once | INTRAVENOUS | Status: AC
  Administered 2014-05-21: 1000 mL via INTRAVENOUS

## 2014-05-21 MED ORDER — LORAZEPAM 1 MG PO TABS: 1.0000 mg | ORAL_TABLET | Freq: Four times a day (QID) | ORAL | Status: AC | PRN

## 2014-05-21 MED ORDER — VANCOMYCIN HCL 10 G IV SOLR
1500.0000 mg | Freq: Once | INTRAVENOUS | Status: AC
  Administered 2014-05-21: 1500 mg via INTRAVENOUS
  Filled 2014-05-21: qty 1500

## 2014-05-21 MED ORDER — LORAZEPAM 2 MG/ML IJ SOLN
0.0000 mg | Freq: Two times a day (BID) | INTRAMUSCULAR | Status: AC
  Administered 2014-05-24: 2 mg via INTRAVENOUS
  Filled 2014-05-21 (×2): qty 1

## 2014-05-21 MED ORDER — PANTOPRAZOLE SODIUM 40 MG PO TBEC
40.0000 mg | DELAYED_RELEASE_TABLET | Freq: Every day | ORAL | Status: DC
  Administered 2014-05-21 – 2014-05-27 (×6): 40 mg via ORAL
  Filled 2014-05-21 (×6): qty 1

## 2014-05-21 MED ORDER — ALBUTEROL SULFATE (2.5 MG/3ML) 0.083% IN NEBU: 2.5000 mg | INHALATION_SOLUTION | RESPIRATORY_TRACT | Status: DC | PRN

## 2014-05-21 MED ORDER — OXYCODONE HCL 5 MG PO TABS
5.0000 mg | ORAL_TABLET | ORAL | Status: DC | PRN
  Administered 2014-05-24 – 2014-05-26 (×3): 5 mg via ORAL
  Filled 2014-05-21 (×3): qty 1

## 2014-05-21 MED ORDER — FOLIC ACID 1 MG PO TABS
1.0000 mg | ORAL_TABLET | Freq: Every day | ORAL | Status: DC
Start: 2014-05-21 — End: 2014-05-27
  Administered 2014-05-21 – 2014-05-27 (×6): 1 mg via ORAL
  Filled 2014-05-21 (×7): qty 1

## 2014-05-21 MED ORDER — IOHEXOL 300 MG/ML  SOLN
25.0000 mL | INTRAMUSCULAR | Status: AC
  Administered 2014-05-21: 25 mL via ORAL

## 2014-05-21 MED ORDER — PIPERACILLIN-TAZOBACTAM 3.375 G IVPB 30 MIN
3.3750 g | Freq: Once | INTRAVENOUS | Status: AC
  Administered 2014-05-21: 3.375 g via INTRAVENOUS
  Filled 2014-05-21: qty 50

## 2014-05-21 MED ORDER — SODIUM CHLORIDE 0.9 % IV BOLUS (SEPSIS)
1000.0000 mL | INTRAVENOUS | Status: AC
  Administered 2014-05-21: 1000 mL via INTRAVENOUS

## 2014-05-21 MED ORDER — SODIUM CHLORIDE 0.9 % IV BOLUS (SEPSIS): 500.0000 mL | INTRAVENOUS | Status: AC

## 2014-05-21 NOTE — ED Notes (Signed)
NOTIFIED W. DANSE ,PAC [facility] PATIENTS LAB RESULTS OF CG4+LACTIC ACID  05/21/2014.

## 2014-05-21 NOTE — Progress Notes (Addendum)
ANTIBIOTIC CONSULT NOTE - INITIAL  Pharmacy Consult for vancomycin Indication: rule out sepsis  No Known Allergies  Patient Measurements:   Adjusted Body Weight:   Vital Signs: Temp: 96.6 F (35.9 C) (03/07 1612) Temp Source: Rectal (03/07 1612) BP: 90/46 mmHg (03/07 1600) Pulse Rate: 76 (03/07 1600) Intake/Output from previous day:   Intake/Output from this shift:    Labs:  Recent Labs  05/21/14 1345  WBC 5.7  HGB 8.9*  PLT 116*  CREATININE 2.17*   CrCl cannot be calculated (Unknown ideal weight.). No results for input(s): VANCOTROUGH, VANCOPEAK, VANCORANDOM, GENTTROUGH, GENTPEAK, GENTRANDOM, TOBRATROUGH, TOBRAPEAK, TOBRARND, AMIKACINPEAK, AMIKACINTROU, AMIKACIN in the last 72 hours.   Microbiology: No results found for this or any previous visit (from the past 720 hour(s)).  Medical History: Past Medical History  Diagnosis Date  . Hypertension   . Tobacco abuse   . DVT (deep venous thrombosis) 2012    Right lower ext.  Marland Kitchen GERD (gastroesophageal reflux disease)   . Renal cyst   . Renal failure     Stage 2- Stage 3  . DVT (deep venous thrombosis) 11/11/2010  . Arterial insufficiency 11/11/2010  . Renal insufficiency 11/11/2010  . Hypertension 11/11/2010  . GERD (gastroesophageal reflux disease) 11/11/2010  . 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 ETOH abuse 12/10/2010  . Folic acid deficiency 0/92/3300  . Peripheral vascular disease   . Tubular adenoma of colon 2012  . H. pylori infection 2013    treated with prevpac    Medications:  Anti-infectives    Start     Dose/Rate Route Frequency Ordered Stop   05/22/14 1800  vancomycin (VANCOCIN) IVPB 1000 mg/200 mL premix     1,000 mg 200 mL/hr over 60 Minutes Intravenous Every 24 hours 05/21/14 1627     05/21/14 1630  vancomycin (VANCOCIN) 1,500 mg in sodium chloride 0.9 % 500 mL IVPB     1,500 mg 250 mL/hr over 120 Minutes Intravenous  Once 05/21/14 1625     05/21/14 1615  piperacillin-tazobactam (ZOSYN) IVPB  3.375 g     3.375 g 100 mL/hr over 30 Minutes Intravenous  Once 05/21/14 1610       Assessment: 59 yom presented to the ED with SOB. To start vancomycin for treatment of possible sepsis. Pt is hypothermic and WBC is WNL. Scr is elevated at 2.17. MD also ordered a 1x dose of zosyn.   Vanc 3/7>> Zosyn x 1 3/7  Goal of Therapy:  Vancomycin trough level 15-20 mcg/ml  Plan:  1. Vancomycin 1500mg  IV x 1 then 1gm IV Q24H 2. F/u continuation of zosyn 3. F/u renal fxn, C&S, clinical status and trough at [facility], Rande Lawman 05/21/2014,4:28 PM  Addendum: Continuing zosyn and adding clindamycin for perianal abscess, ?toxic shock.   Plan: 1. Zosyn 3.375gm IV Q8H (4 hr inf) 2. Clinda 600mg  IV Q8H 3. F/u renal fxn, C&S, clinical status and LOT  Salome Arnt, PharmD, BCPS Pager # [phone] 05/21/2014 5:54 PM

## 2014-05-21 NOTE — ED Provider Notes (Signed)
69 year old male, history of peripheral vascular disease requiring bypass on the right lower extremity, at that time the patient was felt to have arthritis causing increased pain after surgery as he was having good blood flow and claudication symptoms only after half a mile of ambulation. According to the surgical notes. At this time the patient complains of ongoing right leg pain and swelling, on exam the patient has bilateral severe edema, significant pitting edema, will capillary refill to the bilateral lower extremities, clear heart and lung sounds without tachycardia or rales, soft abdomen, the patient does have a diffuse body rash, he is being seen by his family doctor and has been referred to a dermatologist for this. The patient states that he does have a fall this morning while ambulating with his walker when he got tripped on his walker, he has some abrasions over his right lower extremity consistent with this fall. Joints are supple, compartments are soft.  He does have a area of deep tissue infection and induration in his left buttock extending close to the perianal area. There is not appear to be any GI bleeding on rectal exam per physician assistant exam. He will need CT scanning of his abdomen pelvis, this was already given a be ordered by his family doctor but now we can use it to evaluate for the soft tissue infection. We'll give broad-spectrum and bought X to cover soft tissue infection, anticipate need for admission given hypotension and acidosis.  Care was discussed with the hospitalist though ultrasound of the lower extremity and CT scan of the abdomen and pelvis is not yet complete. Dr.Ghimire has generously accepted the patient to be admitted to a stepdown unit, appreciate consult and assistance with admission at this very complicated and ill appearing patient. 30 mL/kg fluid bolus has been given, sepsis antibiotics ordered, lactic acidosis present  CRITICAL CARE Performed by:  Johnna Acosta Total critical care time: 35 Critical care time was exclusive of separately billable procedures and treating other patients. Critical care was necessary to treat or prevent imminent or life-threatening deterioration. Critical care was time spent personally by me on the following activities: development of treatment plan with patient and/or surrogate as well as nursing, discussions with consultants, evaluation of patient's response to treatment, examination of patient, obtaining history from patient or surrogate, ordering and performing treatments and interventions, ordering and review of laboratory studies, ordering and review of radiographic studies, pulse oximetry and re-evaluation of patient's condition.     Medical screening examination/treatment/procedure(s) were conducted as a shared visit with non-physician practitioner(s) and myself.  I personally evaluated the patient during the encounter.  Clinical Impression:   Final diagnoses:  Sepsis, due to unspecified organism  Bilateral edema of lower extremity  Perianal abscess  Acute on chronic renal failure         Noemi Chapel, MD 05/21/14 2026

## 2014-05-21 NOTE — Consult Note (Signed)
Reason for Consult: possible perianal abscess Referring Physician: Dr Ileana Roup is an 69 y.o. male.  HPI: 40 yom with multiple medical problems including ongoing alcohol abuse, prior dvt who arrives with a two week complaint of fatigue and weak lower extremities.  He has had chills and is worried about a rash that has developed.  He states that he is having bms, no n/v and has been eating lately. He denies fevers.  He states he may have a had a perianal abscess previously.  He is getting admitted by medical team for possible sepsis and concern for perianal abscess. The patient does not complain about it   Past Medical History  Diagnosis Date  . Hypertension   . Tobacco abuse   . DVT (deep venous thrombosis) 2012    Right lower ext.  Marland Kitchen GERD (gastroesophageal reflux disease)   . Renal cyst   . Renal failure     Stage 2- Stage 3  . DVT (deep venous thrombosis) 11/11/2010  . Arterial insufficiency 11/11/2010  . Renal insufficiency 11/11/2010  . Hypertension 11/11/2010  . GERD (gastroesophageal reflux disease) 11/11/2010  . 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 ETOH abuse 12/10/2010  . Folic acid deficiency 4/58/0998  . Peripheral vascular disease   . Tubular adenoma of colon 2012  . H. pylori infection 2013    treated with prevpac    Past Surgical History  Procedure Laterality Date  . 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 Adele Barthel  . Back surgery    . Colonoscopy   04/22/2011    [id number] colonic polyps - status post multiple snare polypectomies.  1 tubular adenoma, 1 hyperplastic colonic diverticulosis. Internal hemorrhoids  . Esophagogastroduodenoscopy  04/22/2011    RMR: H. pylori gastritis, peptic duodenitis, erosive reflux esophagitis; hiatal hernia    Family History  Problem Relation Age of Onset  . Diabetes Brother   . Deep vein thrombosis Brother   . Hyperlipidemia Brother   . Heart disease  Brother     before age 66  . Heart attack Brother   . Hypertension Brother   . Colon cancer Neg Hx   . Cancer Mother   . Hypertension Mother   . Cancer Father   . Hypertension Father   . Heart disease Sister     Heart disease before age 106  . Hyperlipidemia Sister   . Heart attack Sister   . Hypertension Sister     Social History:  reports that he has been smoking Cigarettes.  He has a 50 pack-year smoking history. He has never used smokeless tobacco. He reports that he drinks about 1.2 - 1.8 oz of alcohol per week. He reports that he does not use illicit drugs.  Allergies: No Known Allergies  Medications: reviewed  Results for orders placed or performed during the hospital encounter of 05/21/14 (from the past 48 hour(s))  Comprehensive metabolic panel     Status: Abnormal   Collection Time: 05/21/14  1:45 PM  Result Value Ref Range   Sodium 138 135 - 145 mmol/L   Potassium 4.9 3.5 - 5.1 mmol/L    Comment: SLIGHT HEMOLYSIS   Chloride 109 96 - 112 mmol/L   CO2 18 (L) 19 - 32 mmol/L   Glucose, Bld 132 (H) 70 - 99 mg/dL   BUN 17 6 - 23 mg/dL   Creatinine, Ser 2.17 (H) 0.50 - 1.35 mg/dL   Calcium 6.6 (L) 8.4 -  10.5 mg/dL   Total Protein 4.0 (L) 6.0 - 8.3 g/dL   Albumin 1.2 (L) 3.5 - 5.2 g/dL   AST 43 (H) 0 - 37 U/L   ALT 38 0 - 53 U/L   Alkaline Phosphatase 150 (H) 39 - 117 U/L   Total Bilirubin 1.2 0.3 - 1.2 mg/dL   GFR calc non Af Amer 30 (L) >90 mL/min   GFR calc Af Amer 34 (L) >90 mL/min    Comment: (NOTE) The eGFR has been calculated using the CKD EPI equation. This calculation has not been validated in all clinical situations. eGFR's persistently <90 mL/min signify possible Chronic Kidney Disease.    Anion gap 11 5 - 15  CBC with Differential     Status: Abnormal   Collection Time: 05/21/14  1:45 PM  Result Value Ref Range   WBC 5.7 4.0 - 10.5 K/uL   RBC 2.96 (L) 4.22 - 5.81 MIL/uL   Hemoglobin 8.9 (L) 13.0 - 17.0 g/dL   HCT 26.9 (L) 39.0 - 52.0 %   MCV 90.9  78.0 - 100.0 fL   MCH 30.1 26.0 - 34.0 pg   MCHC 33.1 30.0 - 36.0 g/dL   RDW 16.0 (H) 11.5 - 15.5 %   Platelets 116 (L) 150 - 400 K/uL    Comment: PLATELET COUNT CONFIRMED BY SMEAR   Neutrophils Relative % 77 43 - 77 %   Neutro Abs 4.4 1.7 - 7.7 K/uL   Lymphocytes Relative 10 (L) 12 - 46 %   Lymphs Abs 0.6 (L) 0.7 - 4.0 K/uL   Monocytes Relative 12 3 - 12 %   Monocytes Absolute 0.7 0.1 - 1.0 K/uL   Eosinophils Relative 0 0 - 5 %   Eosinophils Absolute 0.0 0.0 - 0.7 K/uL   Basophils Relative 0 0 - 1 %   Basophils Absolute 0.0 0.0 - 0.1 K/uL  I-stat troponin, ED     Status: None   Collection Time: 05/21/14  2:40 PM  Result Value Ref Range   Troponin i, poc 0.01 0.00 - 0.08 ng/mL   Comment 3            Comment: Due to the release kinetics of cTnI, a negative result within the first hours of the onset of symptoms does not rule out myocardial infarction with certainty. If myocardial infarction is still suspected, repeat the test at appropriate intervals.   I-Stat CG4 Lactic Acid, ED     Status: Abnormal   Collection Time: 05/21/14  2:41 PM  Result Value Ref Range   Lactic Acid, Venous 6.49 (HH) 0.5 - 2.0 mmol/L   Comment NOTIFIED PHYSICIAN   POC occult blood, ED Provider will collect     Status: None   Collection Time: 05/21/14  4:28 PM  Result Value Ref Range   Fecal Occult Bld NEGATIVE NEGATIVE  I-Stat CG4 Lactic Acid, ED (not at [facility])     Status: Abnormal   Collection Time: 05/21/14  4:54 PM  Result Value Ref Range   Lactic Acid, Venous 4.24 (HH) 0.5 - 2.0 mmol/L  Urinalysis, Routine w reflex microscopic     Status: Abnormal   Collection Time: 05/21/14  5:09 PM  Result Value Ref Range   Color, Urine YELLOW YELLOW   APPearance CLEAR CLEAR   Specific Gravity, Urine 1.010 1.005 - 1.030   pH 6.0 5.0 - 8.0   Glucose, UA NEGATIVE NEGATIVE mg/dL   Hgb urine dipstick NEGATIVE NEGATIVE   Bilirubin Urine SMALL (A)  NEGATIVE   Ketones, ur NEGATIVE NEGATIVE mg/dL   Protein, ur  NEGATIVE NEGATIVE mg/dL   Urobilinogen, UA 0.2 0.0 - 1.0 mg/dL   Nitrite NEGATIVE NEGATIVE   Leukocytes, UA NEGATIVE NEGATIVE    Comment: MICROSCOPIC NOT DONE ON URINES WITH NEGATIVE PROTEIN, BLOOD, LEUKOCYTES, NITRITE, OR GLUCOSE <1000 mg/dL.  Brain natriuretic peptide     Status: Abnormal   Collection Time: 05/21/14  5:59 PM  Result Value Ref Range   B Natriuretic Peptide 701.4 (H) 0.0 - 100.0 pg/mL  Protime-INR     Status: Abnormal   Collection Time: 05/21/14  6:00 PM  Result Value Ref Range   Prothrombin Time 17.4 (H) 11.6 - 15.2 seconds   INR 1.41 0.00 - 1.49  Reticulocytes     Status: Abnormal   Collection Time: 05/21/14  6:00 PM  Result Value Ref Range   Retic Ct Pct 1.6 0.4 - 3.1 %   RBC. 3.09 (L) 4.22 - 5.81 MIL/uL   Retic Count, Manual 49.4 19.0 - 186.0 K/uL    Dg Chest 1 View  05/21/2014   CLINICAL DATA:  Bilateral leg swelling and shortness of breath.  EXAM: CHEST  1 VIEW  COMPARISON:  02/07/2014  FINDINGS: Artifact overlies chest. The patient has taken a poor inspiration. Heart size is at the upper limits of normal. Mediastinal shadows are normal. There is probably pulmonary venous hypertension but there is no frank edema. No effusions. No collapse or infiltrate.  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  No frank edema.   Electronically Signed   By: Nelson Chimes M.D.   On: 05/21/2014 15:29    Review of Systems  Constitutional: Positive for chills and malaise/fatigue. Negative for fever.  Respiratory: Positive for shortness of breath.   Cardiovascular: Negative for chest pain.  Gastrointestinal: Negative for nausea, vomiting, abdominal pain, diarrhea and constipation.   Blood pressure 118/66, pulse 72, temperature 96.6 F (35.9 C), temperature source Rectal, resp. rate 24, SpO2 96 %. Physical Exam  Eyes: No scleral icterus.  Neck: Neck supple.  Cardiovascular: Normal rate, regular rhythm and normal heart sounds.   Respiratory: Effort normal and breath  sounds normal.  GI: Bowel sounds are normal. He exhibits distension (mild). There is no tenderness.  Genitourinary:       Assessment/Plan: Sepsis  I do not think the perianal area is causing his sepsis currently. This looks like a chronic process.  Will follow along with you  [facility] 05/21/2014, 7:15 PM

## 2014-05-21 NOTE — Progress Notes (Signed)
Pt admitted to 2C03, pt having some pain in his right thigh, pt alert and orientated x4. Pt legs are swollen and skin in bruised and has some open sores on feet pt stated that was from a fall. Warming blanket applied, pt was chilled.

## 2014-05-21 NOTE — ED Notes (Signed)
Per [facility]: initially called out for sob, upon arrival pt was complaining more of RLE swelling and pain, leg is red and swollen.  Patient was also hypotensive with SBP in the mid 80s, EMS gave 600 cc of NaCl, and patient's pressure increased to 105/60. Upon triage, patient's pressure was 91/58. 98% ra.

## 2014-05-21 NOTE — Progress Notes (Signed)
VASCULAR LAB PRELIMINARY  PRELIMINARY  PRELIMINARY  PRELIMINARY  Right lower extremity venous duplex completed.    Preliminary report:  Right:  No evidence of DVT, superficial thrombosis, or Baker's cyst.  Kyeisha Janowicz, RVT 05/21/2014, 5:41 PM

## 2014-05-21 NOTE — ED Provider Notes (Signed)
CSN: [id number]     Arrival date & time 05/21/14  1340 History   First MD Initiated Contact with Patient 05/21/14 1351     Chief Complaint  Patient presents with  . Leg Swelling   Justin Gilmore is a 69 y.o. male with a history of DVT, hypertension, arterial insufficiency, renal alcohol abuse, and peripheral vascular disease who presents to the ED complaining of right leg swelling and pain that has been ongoing for 2-3 weeks. He also reports intermittent shortness of breath ongoing for the same amount of time. The patient has had a history of DVT in this right leg as well as stents placed previously. He reports his previously been on Coumadin but has been stopped for an unknown reason. He is unsure if he is on any anticoagulants at this time. Patient has taken nothing for his pain today. The patient denies fevers, cough, dysuria, abdominal pain, nausea, vomiting.   (Consider loc insufficiencyation/radiation/quality/duration/timing/severity/associated sxs/prior Treatment) HPI  Past Medical History  Diagnosis Date  . Hypertension   . Tobacco abuse   . DVT (deep venous thrombosis) 2012    Right lower ext.  Marland Kitchen GERD (gastroesophageal reflux disease)   . Renal cyst   . Renal failure     Stage 2- Stage 3  . DVT (deep venous thrombosis) 11/11/2010  . Arterial insufficiency 11/11/2010  . Renal insufficiency 11/11/2010  . Hypertension 11/11/2010  . GERD (gastroesophageal reflux disease) 11/11/2010  . 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 ETOH abuse 12/10/2010  . Folic acid deficiency 4/70/9628  . Peripheral vascular disease   . Tubular adenoma of colon 2012  . H. pylori infection 2013    treated with prevpac   Past Surgical History  Procedure Laterality Date  . 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 Adele Barthel  . Back surgery    . Colonoscopy   04/22/2011    [id number] colonic polyps - status post multiple snare polypectomies.  1 tubular  adenoma, 1 hyperplastic colonic diverticulosis. Internal hemorrhoids  . Esophagogastroduodenoscopy  04/22/2011    RMR: H. pylori gastritis, peptic duodenitis, erosive reflux esophagitis; hiatal hernia   Family History  Problem Relation Age of Onset  . Diabetes Brother   . Deep vein thrombosis Brother   . Hyperlipidemia Brother   . Heart disease Brother     before age 14  . Heart attack Brother   . Hypertension Brother   . Colon cancer Neg Hx   . Cancer Mother   . Hypertension Mother   . Cancer Father   . Hypertension Father   . Heart disease Sister     Heart disease before age 17  . Hyperlipidemia Sister   . Heart attack Sister   . Hypertension Sister    History  Substance Use Topics  . Smoking status: Current Every Day Smoker -- 1.00 packs/day for 50 years    Types: Cigarettes  . Smokeless tobacco: Never Used  . Alcohol Use: 1.2 - 1.8 oz/week    2-3 Cans of beer per week     Comment: weekends beer & liquor few drinks    Review of Systems  Constitutional: Negative for fever and chills.  HENT: Negative for congestion and sore throat.   Eyes: Negative for visual disturbance.  Respiratory: Positive for shortness of breath. Negative for wheezing.   Cardiovascular: Positive for leg swelling. Negative for chest pain and palpitations.  Gastrointestinal: Negative for nausea, vomiting, abdominal pain and diarrhea.  Genitourinary: Negative for dysuria and hematuria.  Musculoskeletal: Negative for back pain and neck pain.  Skin: Negative for rash.  Neurological: Negative for headaches.      Allergies  Review of patient's allergies indicates no known allergies.  Home Medications   Prior to Admission medications   Medication Sig Start Date End Date Taking? Authorizing Provider  amLODipine (NORVASC) 10 MG tablet Take 10 mg by mouth daily.     Yes Historical Provider, MD  aspirin 81 MG chewable tablet Chew 81 mg by mouth daily.   Yes Historical Provider, MD  B Complex-C  (SUPER B COMPLEX PO) Take by mouth daily.   Yes Historical Provider, MD  chlorproMAZINE (THORAZINE) 10 MG tablet Take 10 mg by mouth 4 (four) times daily as needed for hiccoughs.  06/29/13  Yes Historical Provider, MD  folic acid (FOLVITE) 542 MCG tablet Take 800 mcg by mouth daily.   Yes Historical Provider, MD  HYDROcodone-acetaminophen (NORCO/VICODIN) 5-325 MG per tablet Take 1 tablet by mouth every 6 (six) hours as needed for moderate pain.  04/26/12  Yes Historical Provider, MD  loperamide (IMODIUM A-D) 2 MG tablet Take 2 mg by mouth 4 (four) times daily as needed for diarrhea or loose stools.   Yes Historical Provider, MD  metoprolol (LOPRESSOR) 50 MG tablet Take 50 mg by mouth 2 (two) times daily.     Yes Historical Provider, MD  pantoprazole (PROTONIX) 40 MG tablet TAKE ONE TABLET BY MOUTH EVERY DAY 04/18/12  Yes Andria Meuse, NP   BP 120/58 mmHg  Pulse 81  Temp(Src) 96.6 F (35.9 C) (Rectal)  Resp 22  SpO2 97% Physical Exam  Constitutional: He is oriented to person, place, and time. He appears well-developed and well-nourished. No distress.  HENT:  Head: Normocephalic and atraumatic.  Mouth/Throat: Oropharynx is clear and moist. No oropharyngeal exudate.  Eyes: Conjunctivae are normal. Pupils are equal, round, and reactive to light. Right eye exhibits no discharge. Left eye exhibits no discharge. Scleral icterus is present.  Neck: Neck supple. No JVD present.  Cardiovascular: Normal rate, regular rhythm, normal heart sounds and intact distal pulses.  Exam reveals no gallop and no friction rub.   No murmur heard. Pulmonary/Chest: Effort normal and breath sounds normal. No respiratory distress. He has no wheezes.  Coarse lung sounds bilaterally. No wheezes, or signs of infiltrate on ascultation.   Abdominal: Soft. He exhibits no distension. There is no tenderness.  Abdomen is nontender. There is some mild hepatomegaly. Bowel sounds are present. No sign of ascites.  Genitourinary:   There is an area of deep tissue infection to his left buttocks with overlying erythema and induration that extends to his perianal area.   Musculoskeletal: He exhibits edema and tenderness.  There is right lower leg edema with calf tenderness noted. There is bilateral lower extremity pitting edema with right greater than left. Pain is in right leg and not in left. Bilateral dorsalis pedis and posterior tibialis pulses are auscultated by Doppler.   Lymphadenopathy:    He has no cervical adenopathy.  Neurological: He is alert and oriented to person, place, and time. Coordination normal.  Skin: Skin is warm and dry. No rash noted. He is not diaphoretic. There is erythema. No pallor.  Psychiatric: He has a normal mood and affect. His behavior is normal.  Nursing note and vitals reviewed.   ED Course  Procedures (including critical care time) Labs Review Labs Reviewed  COMPREHENSIVE METABOLIC PANEL - Abnormal; Notable for the following:  CO2 18 (*)    Glucose, Bld 132 (*)    Creatinine, Ser 2.17 (*)    Calcium 6.6 (*)    Total Protein 4.0 (*)    Albumin 1.2 (*)    AST 43 (*)    Alkaline Phosphatase 150 (*)    GFR calc non Af Amer 30 (*)    GFR calc Af Amer 34 (*)    All other components within normal limits  CBC WITH DIFFERENTIAL/PLATELET - Abnormal; Notable for the following:    RBC 2.96 (*)    Hemoglobin 8.9 (*)    HCT 26.9 (*)    RDW 16.0 (*)    Platelets 116 (*)    Lymphocytes Relative 10 (*)    Lymphs Abs 0.6 (*)    All other components within normal limits  I-STAT CG4 LACTIC ACID, ED - Abnormal; Notable for the following:    Lactic Acid, Venous 6.49 (*)    All other components within normal limits  I-STAT CG4 LACTIC ACID, ED - Abnormal; Notable for the following:    Lactic Acid, Venous 4.24 (*)    All other components within normal limits  CULTURE, BLOOD (ROUTINE X 2)  CULTURE, BLOOD (ROUTINE X 2)  URINE CULTURE  URINALYSIS, ROUTINE W REFLEX MICROSCOPIC  BRAIN  NATRIURETIC PEPTIDE  PROTIME-INR  VITAMIN B12  FOLATE  IRON AND TIBC  FERRITIN  RETICULOCYTES  I-STAT TROPOININ, ED  POC OCCULT BLOOD, ED    Imaging Review Dg Chest 1 View  05/21/2014   CLINICAL DATA:  Bilateral leg swelling and shortness of breath.  EXAM: CHEST  1 VIEW  COMPARISON:  02/07/2014  FINDINGS: Artifact overlies chest. The patient has taken a poor inspiration. Heart size is at the upper limits of normal. Mediastinal shadows are normal. There is probably pulmonary venous hypertension but there is no frank edema. No effusions. No collapse or infiltrate.  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  No frank edema.   Electronically Signed   By: Nelson Chimes M.D.   On: 05/21/2014 15:29     EKG Interpretation   Date/Time:  Monday May 21 2014 13:33:42 EST Ventricular Rate:  80 PR Interval:  152 QRS Duration: 92 QT Interval:  406 QTC Calculation: 468 R Axis:   12 Text Interpretation:  Normal sinus rhythm Incomplete right bundle branch  block Septal infarct , age undetermined ST \\T \ T wave abnormality,  consider inferolateral ischemia Abnormal ECG Since last tracing T wave  abnormality in the inferior leads is new. Confirmed by MILLER  MD, [place]  [phone]) on 05/21/2014 1:45:40 PM      Filed Vitals:   05/21/14 1612 05/21/14 1615 05/21/14 1630 05/21/14 1710  BP:  93/50 108/61 120/58  Pulse:  83 86 81  Temp: 96.6 F (35.9 C)     TempSrc: Rectal     Resp:  18 22 22   SpO2:  99% 100% 97%     MDM   Meds given in ED:  Medications  iohexol (OMNIPAQUE) 300 MG/ML solution 25 mL (25 mLs Oral Contrast Given 05/21/14 1626)  sodium chloride 0.9 % bolus 1,000 mL (1,000 mLs Intravenous New Bag/Given 05/21/14 1643)    Followed by  sodium chloride 0.9 % bolus 500 mL (not administered)  vancomycin (VANCOCIN) 1,500 mg in sodium chloride 0.9 % 500 mL IVPB (1,500 mg Intravenous New Bag/Given 05/21/14 1712)  vancomycin (VANCOCIN) IVPB 1000 mg/200 mL premix (not administered)   furosemide (LASIX) injection 40 mg (40 mg Intravenous Given 05/21/14 1556)  sodium chloride 0.9 % bolus 1,000 mL (0 mLs Intravenous Stopped 05/21/14 1640)  piperacillin-tazobactam (ZOSYN) IVPB 3.375 g (0 g Intravenous Stopped 05/21/14 1712)    New Prescriptions   No medications on file    Final diagnoses:  Sepsis, due to unspecified organism  Bilateral edema of lower extremity  Perianal abscess  Acute on chronic renal failure   This  is a 69 y.o. male with a history of DVT, hypertension, arterial insufficiency, renal alcohol abuse, and peripheral vascular disease who presents to the ED complaining of right leg swelling and pain that has been ongoing for 2-3 weeks. He also reports intermittent shortness of breath ongoing for the same amount of time. He denies being on any anticoagulants currently.  Patient is initially mildly hypotensive with a pressure of 93/50. He has some mild hepatomegaly on exam, but no ascites. He has acute on renal failure with a creatinine of 2.17 and a GFR of 34. He has a lactic acid of 6.49. He has bilateral lower extremity edema with right greater than left and right tender to palpation. He has a deep tissue infection on his left buttocks. Will obtain CT abdomen pelvis without contrast.  Vancomycin and Zosyn started in the ED. Patient given 40 mg of IV Lasix and started on fluid bolus per sepsis protocol.  Dr. Sabra Heck consulted with Dr. Letta Pate for admission to step down unit. Patient and family are in agreement with admission.  This patient was discussed with and evaluated by Dr. Sabra Heck who agrees with assessment and plan.    Waynetta Pean, PA-C 05/21/14 1755  Noemi Chapel, MD 05/21/14 2025

## 2014-05-21 NOTE — H&P (Signed)
PATIENT DETAILS Name: Justin Gilmore Age: 69 y.o. Sex: male Date of Birth: 06/07/1945 Admit Date: 05/21/2014 TOI:[id number],XIPJASN D, MD   CHIEF COMPLAINT:  Worsening generalized weakness for the past 1 week Worsening leg swelling for the past 2-3 weeks  HPI: Justin Gilmore is a 69 y.o. male with a Past Medical History of peripheral vascular disease status post right leg bypass in 2012, ongoing alcohol abuse, hypertension, prior history of DVT not on Coumadin who presents today with the above noted complaint. Patient claims that for the past week or so he has been much more weaker than usual, claims that his bilateral lower extremities have had significant swelling than his usual baseline. He also claims that he thinks that his right leg is not just holding up right, and has had problems ambulating with that particular leg. Per family/patient patient is very minimally ambulatory and only/barely walks around the house. He also complains that he has noticed a rash in his bilateral lower extremities. Denies any pain. He denies any fever-but patient's girlfriend does claim to the patient's been having chills. Patient does not complain of shortness of breath at rest, however his family has noticed some mild dyspnea on exertion. Patient does claim that he has PND and gets up multiple times at night because of shortness of breath. He however denies orthopnea. No history of nausea or vomiting. He claims he had 2 loose stools yesterday. No chest pain. He continues to drink around 2-312 ounce cans of beer on a daily basis along with a shot of liquor. He continues to smoke.  ALLERGIES:  No Known Allergies  PAST MEDICAL HISTORY: Past Medical History  Diagnosis Date  . Hypertension   . Tobacco abuse   . DVT (deep venous thrombosis) 2012    Right lower ext.  Marland Kitchen GERD (gastroesophageal reflux disease)   . Renal cyst   . Renal failure     Stage 2- Stage 3  . DVT (deep venous thrombosis) 11/11/2010    . Arterial insufficiency 11/11/2010  . Renal insufficiency 11/11/2010  . Hypertension 11/11/2010  . GERD (gastroesophageal reflux disease) 11/11/2010  . 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 ETOH abuse 12/10/2010  . Folic acid deficiency 0/53/9767  . Peripheral vascular disease   . Tubular adenoma of colon 2012  . H. pylori infection 2013    treated with prevpac    PAST SURGICAL HISTORY: Past Surgical History  Procedure Laterality Date  . 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 Adele Barthel  . Back surgery    . Colonoscopy   04/22/2011    HAL:[id number] colonic polyps - status post multiple snare polypectomies.  1 tubular adenoma, 1 hyperplastic colonic diverticulosis. Internal hemorrhoids  . Esophagogastroduodenoscopy  04/22/2011    RMR: H. pylori gastritis, peptic duodenitis, erosive reflux esophagitis; hiatal hernia    MEDICATIONS AT HOME: Prior to Admission medications   Medication Sig Start Date End Date Taking? Authorizing Provider  amLODipine (NORVASC) 10 MG tablet Take 10 mg by mouth daily.     Yes Historical Provider, MD  aspirin 81 MG chewable tablet Chew 81 mg by mouth daily.   Yes Historical Provider, MD  B Complex-C (SUPER B COMPLEX PO) Take by mouth daily.   Yes Historical Provider, MD  chlorproMAZINE (THORAZINE) 10 MG tablet Take 10 mg by mouth 4 (four) times daily as needed for hiccoughs.  06/29/13  Yes Historical Provider, MD  folic acid (  FOLVITE) 800 MCG tablet Take 800 mcg by mouth daily.   Yes Historical Provider, MD  HYDROcodone-acetaminophen (NORCO/VICODIN) 5-325 MG per tablet Take 1 tablet by mouth every 6 (six) hours as needed for moderate pain.  04/26/12  Yes Historical Provider, MD  loperamide (IMODIUM A-D) 2 MG tablet Take 2 mg by mouth 4 (four) times daily as needed for diarrhea or loose stools.   Yes Historical Provider, MD  metoprolol (LOPRESSOR) 50 MG tablet Take 50 mg by mouth 2 (two) times daily.     Yes  Historical Provider, MD  pantoprazole (PROTONIX) 40 MG tablet TAKE ONE TABLET BY MOUTH EVERY DAY 04/18/12  Yes Andria Meuse, NP    FAMILY HISTORY: Family History  Problem Relation Age of Onset  . Diabetes Brother   . Deep vein thrombosis Brother   . Hyperlipidemia Brother   . Heart disease Brother     before age 68  . Heart attack Brother   . Hypertension Brother   . Colon cancer Neg Hx   . Cancer Mother   . Hypertension Mother   . Cancer Father   . Hypertension Father   . Heart disease Sister     Heart disease before age 46  . Hyperlipidemia Sister   . Heart attack Sister   . Hypertension Sister     SOCIAL HISTORY:  reports that he has been smoking Cigarettes.  He has a 50 pack-year smoking history. He has never used smokeless tobacco. He reports that he drinks about 1.2 - 1.8 oz of alcohol per week. He reports that he does not use illicit drugs.  REVIEW OF SYSTEMS:  Constitutional:   No  weight loss, night sweats.  HEENT:    No headaches, Difficulty swallowing,Tooth/dental problems,Sore throat,     Cardio-vascular: No chest pain,  Orthopnea, dizziness, palpitations  GI:  No heartburn, indigestion, abdominal pain, nausea, vomiting  Resp: No shortness of breath with exertion or at rest.  No excess mucus,   No coughing up of blood.No change in color of mucus.No chest wall deformity  Skin:  no rash or lesions.  GU:  no dysuria, change in color of urine, no urgency or frequency.  No flank pain.  Musculoskeletal: No joint pain or swelling.  No decreased range of motion.  No back pain.  Psych: No change in mood or affect. No depression or anxiety.  No memory loss.   PHYSICAL EXAM: Blood pressure 108/61, pulse 86, temperature 96.6 F (35.9 C), temperature source Rectal, resp. rate 22, SpO2 100 %.  General appearance :Awake, alert, looks acutely ill. Speech slow but clear.HEENT: Atraumatic and Normocephalic, pupils equally reactive to light and  accomodation Neck: supple,  No cervical lymphadenopathy.  Chest:Good air entry bilaterally, some scattered rhonchi. Transmitted upper airway sounds.  CVS: S1 S2 regular Abdomen: Bowel sounds present, Non tender and not distended with no gaurding, rigidity or rebound. Approximately 6 cm 3 cm indurated area in the left perianal area with 2 sinus drainage points. Plus purulent discharge. Extremities: B/L Lower Ext shows 3-4+ edema, both legs are warm to touch Neurology: Awake alert, and oriented X 3, CN II-XII intact, Non focal Skin: Patchy lacy erythematous rash evident in bilateral groin, bilateral upper thigh area.   LABS ON ADMISSION:   Recent Labs  05/21/14 1345  NA 138  K 4.9  CL 109  CO2 18*  GLUCOSE 132*  BUN 17  CREATININE 2.17*  CALCIUM 6.6*    Recent Labs  05/21/14 1345  AST  43*  ALT 38  ALKPHOS 150*  BILITOT 1.2  PROT 4.0*  ALBUMIN 1.2*   No results for input(s): LIPASE, AMYLASE in the last 72 hours.  Recent Labs  05/21/14 1345  WBC 5.7  NEUTROABS 4.4  HGB 8.9*  HCT 26.9*  MCV 90.9  PLT 116*   No results for input(s): CKTOTAL, CKMB, CKMBINDEX, TROPONINI in the last 72 hours. No results for input(s): DDIMER in the last 72 hours. Invalid input(s): POCBNP   RADIOLOGIC STUDIES ON ADMISSION: Dg Chest 1 View  05/21/2014   CLINICAL DATA:  Bilateral leg swelling and shortness of breath.  EXAM: CHEST  1 VIEW  COMPARISON:  02/07/2014  FINDINGS: Artifact overlies chest. The patient has taken a poor inspiration. Heart size is at the upper limits of normal. Mediastinal shadows are normal. There is probably pulmonary venous hypertension but there is no frank edema. No effusions. No collapse or infiltrate.  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  No frank edema.   Electronically Signed   By: Nelson Chimes M.D.   On: 05/21/2014 15:29     EKG: Independently reviewed. NSR  ASSESSMENT AND PLAN: Present on Admission:  . Severe Sepsis: Suspect secondary  to large left perianal abscess. Empirically started on vancomycin/clindamycin and Zosyn-given rash-at this time cannot rule out toxic shock syndrome-although I doubt. Currently getting IV fluid resuscitation and empiric antibiotics. May need to be cautious with IV fluids given significant amount of lower extremity edema. Have  consulted general surgery (Dr Donne Hazel), but CT scan of the abdomen is currently pending. If after fluid resuscitation, if still hypotensive will need to consult critical care. But blood pressure slowly seems to be responding to IV fluids. He will be monitored in the stepdown unit. Blood cultures will need to be followed.  . Acute on chronic renal failure stage III: Suspect acute renal failure likely prerenal azotemia secondary to above. Cautiously continue to hydrate. Stop all antihypertensive medications for now allow blood pressure to increase.follow electrolytes, if further worsening may need nephrology consultation.  . Lower extremity edema: Has significant hypoalbuminemia-suspect this may be from underlying undiagnosed cirrhosis given significant history of alcohol abuse. However has peripheral vascular disease, history of DVT, possible COPD, may have cor pulmonale/pulmonary hypertension. We'll check echo, check BNP, UA to see if any proteins.. Once fluid resuscitation complete, may need to limit IV fluids.  Once blood pressure stable, will need aggressive IV diuresis.  . Perianal abscess: Likely causing sepsis-no other foci evident at this time. Pending UA. With the rash, some question for whether this is toxic shock syndrome-will be empirically covered with vancomycin/Zosyn/clindamycin. General surgery has been consulted. Pending CT scan of the abdomen with oral contrast.   . Anemia: Suspect secondary to critical illness, some amount of chronic anemia likely secondary to chronic disease. Follow. Check anemia panel.  . 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 Follow.  Marland Kitchen GERD (gastroesophageal reflux disease): Continue PPI   . Atherosclerosis of native arteries of extremity with intermittent claudication: Has had prior bypass surgery in his right lower extremities. Both lower extremities are significantly swollen, but appear warm. Follows with [facility].   . Hypertension: Given current hypotension-hold all antihypertensive medications. Resume when able   . ETOH abuse: Counseled, Ativan per CIWA protocol  . Severe Malnutrition: Consult dietitian   Further plan will depend as patient's clinical course evolves and further radiologic and laboratory data become available. Patient will be monitored closely.  Above noted plan was discussed  with patient/girlfriend/sister, they were in agreement.   DVT Prophylaxis: Prophylactic Heparin  Code Status: Full Code   Total time spent for admission equals 45 minutes.  [facility] Pager [phone]  If 7PM-7AM, please contact night-coverage [email or web address] Password [email or web address] 05/21/2014, 5:05 PM

## 2014-05-22 ENCOUNTER — Inpatient Hospital Stay (HOSPITAL_COMMUNITY): Payer: Medicare Other

## 2014-05-22 DIAGNOSIS — R600 Localized edema: Secondary | ICD-10-CM

## 2014-05-22 LAB — CBC
HEMATOCRIT: 25.7 % — AB (ref 39.0–52.0)
Hemoglobin: 8.6 g/dL — ABNORMAL LOW (ref 13.0–17.0)
MCH: 29.4 pg (ref 26.0–34.0)
MCHC: 33.5 g/dL (ref 30.0–36.0)
MCV: 87.7 fL (ref 78.0–100.0)
Platelets: 112 10*3/uL — ABNORMAL LOW (ref 150–400)
RBC: 2.93 MIL/uL — ABNORMAL LOW (ref 4.22–5.81)
RDW: 16 % — AB (ref 11.5–15.5)
WBC: 4.9 10*3/uL (ref 4.0–10.5)

## 2014-05-22 LAB — LIPASE, BLOOD: Lipase: 13 U/L (ref 11–59)

## 2014-05-22 LAB — VITAMIN B12: VITAMIN B 12: 1676 pg/mL — AB (ref 211–911)

## 2014-05-22 LAB — HEPATIC FUNCTION PANEL
ALT: 35 U/L (ref 0–53)
AST: 51 U/L — ABNORMAL HIGH (ref 0–37)
Albumin: 1.1 g/dL — ABNORMAL LOW (ref 3.5–5.2)
Alkaline Phosphatase: 143 U/L — ABNORMAL HIGH (ref 39–117)
BILIRUBIN TOTAL: 1.2 mg/dL (ref 0.3–1.2)
Bilirubin, Direct: 0.6 mg/dL — ABNORMAL HIGH (ref 0.0–0.5)
Indirect Bilirubin: 0.6 mg/dL (ref 0.3–0.9)
Total Protein: 3.9 g/dL — ABNORMAL LOW (ref 6.0–8.3)

## 2014-05-22 LAB — BASIC METABOLIC PANEL
Anion gap: 3 — ABNORMAL LOW (ref 5–15)
BUN: 16 mg/dL (ref 6–23)
CO2: 21 mmol/L (ref 19–32)
Calcium: 6.5 mg/dL — ABNORMAL LOW (ref 8.4–10.5)
Chloride: 116 mmol/L — ABNORMAL HIGH (ref 96–112)
Creatinine, Ser: 2 mg/dL — ABNORMAL HIGH (ref 0.50–1.35)
GFR calc Af Amer: 38 mL/min — ABNORMAL LOW (ref >90)
GFR, EST NON AFRICAN AMERICAN: 33 mL/min — AB (ref >90)
GLUCOSE: 87 mg/dL (ref 70–99)
Potassium: 4.5 mmol/L (ref 3.5–5.1)
Sodium: 140 mmol/L (ref 135–145)

## 2014-05-22 LAB — IRON AND TIBC
IRON: 15 ug/dL — AB (ref 42–165)
Saturation Ratios: 24 % (ref 20–55)
TIBC: 63 ug/dL — ABNORMAL LOW (ref 215–435)
UIBC: 48 ug/dL — ABNORMAL LOW (ref 125–400)

## 2014-05-22 LAB — FOLATE: Folate: >20 ng/mL

## 2014-05-22 LAB — MRSA PCR SCREENING: MRSA by PCR: NEGATIVE

## 2014-05-22 LAB — FERRITIN: Ferritin: 344 ng/mL — ABNORMAL HIGH (ref 22–322)

## 2014-05-22 LAB — CLOSTRIDIUM DIFFICILE BY PCR: CDIFFPCR: NEGATIVE

## 2014-05-22 MED ORDER — SODIUM CHLORIDE 0.9 % IV SOLN
INTRAVENOUS | Status: DC
  Administered 2014-05-22 (×2): via INTRAVENOUS

## 2014-05-22 NOTE — Progress Notes (Signed)
*  PRELIMINARY RESULTS* Echocardiogram 2D Echocardiogram has been performed.  Justin Gilmore 05/22/2014, 8:55 AM

## 2014-05-22 NOTE — Progress Notes (Signed)
VASCULAR LAB PRELIMINARY  ARTERIAL  ABI completed:    RIGHT    LEFT    PRESSURE WAVEFORM  PRESSURE WAVEFORM  BRACHIAL 124 Triphasic BRACHIAL 124 Triphasic  DP 64 Dampened Monophasic DP 84 Monophasic  PT 46 Severely Dampened Monophasic PT 88 Biphasic    RIGHT LEFT  ABI 0.52 0.71   Right ABIs and Doppler waveforms indicate a moderate to severe reduction in arterial flow at rest. Left ABIs and Doppler waveforms indiate a moderate reduction in arterial flow at rest.  Justin Gilmore, RVS 05/22/2014, 3:52 PM

## 2014-05-22 NOTE — Progress Notes (Addendum)
[facility] PROGRESS NOTE  Justin Gilmore BZJ:[id number] DOB: 1946/01/08 DOA: 05/21/2014 PCP: Robert Bellow, MD  Assessment/Plan: Principal Problem:   Sepsis Active Problems:   Hypertension   GERD (gastroesophageal reflux disease)   ETOH abuse   Atherosclerosis of native arteries of extremity with intermittent claudication   Lower extremity edema   Perianal abscess   Severe Sepsis: Suspected secondary to left perianal abscess. General surgery feel that this is chronic and not contributing to his symptoms Continue empiric vancomycin/clindamycin and Zosyn-given rash-at this time cannot rule out toxic shock and follow blood culture, no growth so far . Currently getting IV fluid resuscitation Patient complaining of diarrhea and will rule out C. difficile  Fatty infiltration of the liver/small pseudocyst vs mass in the tail of the pancreas Liver function normal Lipase within normal limits Will order MRCP, stool guaiac negative    . Acute on chronic renal failure stage III: Suspect acute renal failure likely prerenal azotemia secondary to above. Although creatinine have not significantly improved after IV hydration Suspect that the patient's baseline is close to his renal function today  if further worsening may need nephrology consultation.   . Lower extremity edema: Has significant hypoalbuminemia-suspect this may be from underlying undiagnosed cirrhosis given significant history of alcohol abuse. However has peripheral vascular disease, family requesting ABI,  Venous Doppler negative, history of DVT, possible COPD, may have cor pulmonale/pulmonary hypertension. 2-D echo pending Mildly elevated BNP, UA negative for proteinuria MRI of the hip to rule out avascular necrosis     . Perianal abscess: Not Likely causing sepsis-no other foci evident at this time. Negative UA. With the rash, some question for whether this is toxic shock syndrome-will be empirically covered with  vancomycin/Zosyn/clindamycin.   . Anemia: Likely secondary to alcohol use in the setting of thrombocytopenia/ Suspect secondary to critical illness Anemia panel shows mild iron deficiency  . 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 GERD (gastroesophageal reflux disease): Continue PPI   . Atherosclerosis of native arteries of extremity with intermittent claudication: Has had prior bypass surgery in his right lower extremities. Both lower extremities are significantly swollen, but appear warm. ABI  . Hypertension: Given current hypotension-hold all antihypertensive medications. Resume  when blood pressure is stable  . ETOH abuse: Counseled, Ativan per CIWA protocol  . Severe Malnutrition: Consult dietitian   Code Status: full Family Communication: family updated about patient's clinical progress Disposition Plan:  Transfer to telemetry    Brief narrative: Justin Gilmore is a 69 y.o. male with a Past Medical History of peripheral vascular disease status post right leg bypass in 2012, ongoing alcohol abuse, hypertension, prior history of DVT not on Coumadin who presents today with the above noted complaint. Patient claims that for the past week or so he has been much more weaker than usual, claims that his bilateral lower extremities have had significant swelling than his usual baseline. He also claims that he thinks that his right leg is not just holding up right, and has had problems ambulating with that particular leg. Per family/patient patient is very minimally ambulatory and only/barely walks around the house. He also complains that he has noticed a rash in his bilateral lower extremities. Denies any pain. He denies any fever-but patient's girlfriend does claim to the patient's been having chills. Patient does not complain of shortness of breath at rest, however his family has noticed some mild dyspnea on exertion. Patient does  claim that he  has PND and gets up multiple times at night because of shortness of breath. He however denies orthopnea. No history of nausea or vomiting. He claims he had 2 loose stools yesterday. No chest pain. He continues to drink around 2-312 ounce cans of beer on a daily basis along with a shot of liquor. He continues to smoke  Consultants:  General surgery  Procedures:  None  Antibiotics: Clindamycin, Zosyn, vancomycin started 3/7  HPI/Subjective: Patient continues to complain of right hip and right leg pain  Objective: Filed Vitals:   05/22/14 0000 05/22/14 0400 05/22/14 0800 05/22/14 1001  BP:   112/74 102/59  Pulse:   82 96  Temp: 98.1 F (36.7 C) 97.8 F (36.6 C)  98 F (36.7 C)  TempSrc: Oral Oral  Oral  Resp:   23 20  Height:      Weight:      SpO2:   97% 100%    Intake/Output Summary (Last 24 hours) at 05/22/14 1207 Last data filed at 05/22/14 0600  Gross per 24 hour  Intake    875 ml  Output    770 ml  Net    105 ml    Exam:  General: No acute respiratory distress Lungs: Clear to auscultation bilaterally without wheezes or crackles Cardiovascular: Regular rate and rhythm without murmur gallop or rub normal S1 and S2 Abdomen: Nontender, nondistended, soft, bowel sounds positive, no rebound, no ascites, no appreciable mass Extremities: No significant cyanosis, clubbing, or edema bilateral lower extremities Skin: Patchy lacy erythematous rash evident in bilateral groin, bilateral upper thigh area     Data Reviewed: Basic Metabolic Panel:  Recent Labs Lab 05/21/14 1345 05/22/14 0347  NA 138 140  K 4.9 4.5  CL 109 116*  CO2 18* 21  GLUCOSE 132* 87  BUN 17 16  CREATININE 2.17* 2.00*  CALCIUM 6.6* 6.5*    Liver Function Tests:  Recent Labs Lab 05/21/14 1345 05/22/14 0347  AST 43* 51*  ALT 38 35  ALKPHOS 150* 143*  BILITOT 1.2 1.2  PROT 4.0* 3.9*  ALBUMIN 1.2* 1.1*    Recent Labs Lab 05/22/14 0347  LIPASE 13   No results  for input(s): AMMONIA in the last 168 hours.  CBC:  Recent Labs Lab 05/21/14 1345 05/22/14 0347  WBC 5.7 4.9  NEUTROABS 4.4  --   HGB 8.9* 8.6*  HCT 26.9* 25.7*  MCV 90.9 87.7  PLT 116* 112*    Cardiac Enzymes: No results for input(s): CKTOTAL, CKMB, CKMBINDEX, TROPONINI in the last 168 hours. BNP (last 3 results)  Recent Labs  05/21/14 1759  BNP 701.4*    ProBNP (last 3 results) No results for input(s): PROBNP in the last 8760 hours.    CBG: No results for input(s): GLUCAP in the last 168 hours.  Recent Results (from the past 240 hour(s))  MRSA PCR Screening     Status: None   Collection Time: 05/21/14  9:43 PM  Result Value Ref Range Status   MRSA by PCR NEGATIVE NEGATIVE Final    Comment:        The GeneXpert MRSA Assay (FDA approved for NASAL specimens only), is one component of a comprehensive MRSA colonization surveillance program. It is not intended to diagnose MRSA infection nor to guide or monitor treatment for MRSA infections.      Studies: Ct Abdomen Pelvis Wo Contrast  05/21/2014   CLINICAL DATA:  Right lower extremity pain and swelling. Hypotension. Decreased hemoglobin.  EXAM: CT ABDOMEN  AND PELVIS WITHOUT CONTRAST  TECHNIQUE: Multidetector CT imaging of the abdomen and pelvis was performed following the standard protocol without IV contrast.  COMPARISON:  CT abdomen and pelvis 05/12/2006.  FINDINGS: The patient has small bilateral pleural effusions. No pericardial effusion is noted. Calcific coronary artery disease is seen.  There is marked fatty infiltration of the liver diffusely. The gallbladder and right adrenal gland are unremarkable. The tail of the pancreas appears expanded and is contiguous with the upper pole the left kidney and splenic hilum. There may be a mass in the pancreatic tail. Additionally, anterior and lateral to the neck of the pancreas, there is a low attenuating focus measuring 2.3 cm AP x 2.7 cm transverse on image 36 with  Hounsfield unit of measurements of 10.9. A low attenuating left adrenal nodule consistent with an adenoma is unchanged. A hyper attenuating lesion the midpole left kidney measures 1.4 cm in diameter and likely represents a complex cyst but cannot be definitively characterized.  Along the descending duodenum, the patient appears to have a duodenum diverticulum. The small bowel is otherwise unremarkable. The walls of the colon are diffusely thickened. While this could be secondary to colitis, this may be related to the patient's hepatic insufficiency as changes are most notable in the ascending and proximal transverse colon. A small volume of upper abdominal ascites is identified. Extensive aortoiliac atherosclerosis is noted.  In the left buttock, there is partial visualization of a lesion measuring 4.4 cm transverse by 3.2 cm AP on image 94. Diffuse body wall edema is noted. No lytic or sclerotic bony lesion is seen.  IMPRESSION: Diffuse and marked fatty infiltration of the liver. Small volume of perisplenic ascites is likely related to hepatic insufficiency.  Expanded tail of the pancreas which is contiguous with the left kidney and splenic hilum is not included on prior studies. Mass in the pancreatic tail is possible. A low attenuating lesion off the neck of the pancreas anterior and laterally may be a pancreatic pseudocyst but cannot be definitively characterized. MRI of the abdomen with contrast is recommended. MRI should be deferred until the patient is able to follow breath holding instructions. This could be performed as an outpatient.  Wall thickening of the colon most notable in the ascending and transverse is likely related to hepatic insufficiency although colitis could create a similar appearance.  Low attenuating lesion in the left buttock is worrisome for abscess.  Anasarca.  Extensive atherosclerosis.   Electronically Signed   By: Inge Rise M.D.   On: 05/21/2014 19:25   Dg Chest 1  View  05/21/2014   CLINICAL DATA:  Bilateral leg swelling and shortness of breath.  EXAM: CHEST  1 VIEW  COMPARISON:  02/07/2014  FINDINGS: Artifact overlies chest. The patient has taken a poor inspiration. Heart size is at the upper limits of normal. Mediastinal shadows are normal. There is probably pulmonary venous hypertension but there is no frank edema. No effusions. No collapse or infiltrate.  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  No frank edema.   Electronically Signed   By: Nelson Chimes M.D.   On: 05/21/2014 15:29   Dg Pelvis 1-2 Views  05/22/2014   CLINICAL DATA:  Right buttock abscess.  EXAM: PELVIS - 1-2 VIEW  COMPARISON:  05/21/2014  FINDINGS: Oral contrast material demonstrated within the descending and sigmoid colon. Lower lumbar spine degenerative change. Osseous pelvis is unremarkable. Vascular calcifications.  IMPRESSION: Lumbar spine degenerative changes.  Osseous skeleton are unremarkable.   Electronically Signed  By: Lovey Newcomer M.D.   On: 05/22/2014 10:23   Dg Chest Port 1 View  05/22/2014   CLINICAL DATA:  Shortness of breath.  EXAM: PORTABLE CHEST - 1 VIEW  COMPARISON:  Chest radiograph 05/21/2014  FINDINGS: Monitoring leads overlie the patient. Stable cardiac and mediastinal contours. No consolidative pulmonary opacities. No pleural effusion or pneumothorax. Regional skeleton is unremarkable.  IMPRESSION: No acute cardiopulmonary process.   Electronically Signed   By: Lovey Newcomer M.D.   On: 05/22/2014 09:57    Scheduled Meds: . clindamycin (CLEOCIN) IV  600 mg Intravenous X7F  . folic acid  1 mg Oral Daily  . heparin  5,000 Units Subcutaneous 3 times per day  . LORazepam  0-4 mg Intravenous Q6H   Followed by  . [START ON 05/23/2014] LORazepam  0-4 mg Intravenous Q12H  . multivitamin with minerals  1 tablet Oral Daily  . pantoprazole  40 mg Oral Daily  . piperacillin-tazobactam (ZOSYN)  IV  3.375 g Intravenous Q8H  . thiamine  100 mg Oral Daily   Or  . thiamine   100 mg Intravenous Daily  . vancomycin  1,000 mg Intravenous Q24H   Continuous Infusions: . sodium chloride 75 mL/hr at 05/22/14 1129    Principal Problem:   Sepsis Active Problems:   Hypertension   GERD (gastroesophageal reflux disease)   ETOH abuse   Atherosclerosis of native arteries of extremity with intermittent claudication   Lower extremity edema   Perianal abscess    Time spent: 40 minutes   [facility] Pager [phone]. If 7PM-7AM, please contact night-coverage at [email or web address], password [email or web address] 05/22/2014, 12:07 PM  LOS: 1 day

## 2014-05-22 NOTE — Progress Notes (Signed)
Patient ID: Justin Gilmore, male   DOB: September 08, 1945, 69 y.o.   MRN: [id number]     [facility]      [address] [place], [place] [address]    Phone: [phone] FAX: [phone]     Subjective: Afebrile.  VSS.    Objective:  Vital signs:  Filed Vitals:   05/21/14 2028 05/22/14 0000 05/22/14 0400 05/22/14 0800  BP: 120/69   112/74  Pulse: 73   82  Temp: 98 F (36.7 C) 98.1 F (36.7 C) 97.8 F (36.6 C)   TempSrc: Oral Oral Oral   Resp: 18   23  Height: 5' 6"  (1.676 m)     Weight: 173 lb 15.1 oz (78.9 kg)     SpO2: 97%   97%    Last BM Date: 05/21/14  Intake/Output   Yesterday:  03/07 0701 - 03/08 0700 In: 875 [I.V.:875] Out: 770 [Urine:770] This shift:     Physical Exam: General: Pt awake/alert/oriented x4 in no acute distress  Skin: left buttock-no induration or erythema, pinpoint opening with purulent drainage, no undrainable pockets.     Problem List:   Principal Problem:   Sepsis Active Problems:   Hypertension   GERD (gastroesophageal reflux disease)   ETOH abuse   Atherosclerosis of native arteries of extremity with intermittent claudication   Lower extremity edema   Perianal abscess    Results:   Labs: Results for orders placed or performed during the hospital encounter of 05/21/14 (from the past 48 hour(s))  Comprehensive metabolic panel     Status: Abnormal   Collection Time: 05/21/14  1:45 PM  Result Value Ref Range   Sodium 138 135 - 145 mmol/L   Potassium 4.9 3.5 - 5.1 mmol/L    Comment: SLIGHT HEMOLYSIS   Chloride 109 96 - 112 mmol/L   CO2 18 (L) 19 - 32 mmol/L   Glucose, Bld 132 (H) 70 - 99 mg/dL   BUN 17 6 - 23 mg/dL   Creatinine, Ser 2.17 (H) 0.50 - 1.35 mg/dL   Calcium 6.6 (L) 8.4 - 10.5 mg/dL   Total Protein 4.0 (L) 6.0 - 8.3 g/dL   Albumin 1.2 (L) 3.5 - 5.2 g/dL   AST 43 (H) 0 - 37 U/L   ALT 38 0 - 53 U/L   Alkaline Phosphatase 150 (H) 39 - 117 U/L   Total Bilirubin 1.2 0.3  - 1.2 mg/dL   GFR calc non Af Amer 30 (L) >90 mL/min   GFR calc Af Amer 34 (L) >90 mL/min    Comment: (NOTE) The eGFR has been calculated using the CKD EPI equation. This calculation has not been validated in all clinical situations. eGFR's persistently <90 mL/min signify possible Chronic Kidney Disease.    Anion gap 11 5 - 15  CBC with Differential     Status: Abnormal   Collection Time: 05/21/14  1:45 PM  Result Value Ref Range   WBC 5.7 4.0 - 10.5 K/uL   RBC 2.96 (L) 4.22 - 5.81 MIL/uL   Hemoglobin 8.9 (L) 13.0 - 17.0 g/dL   HCT 26.9 (L) 39.0 - 52.0 %   MCV 90.9 78.0 - 100.0 fL   MCH 30.1 26.0 - 34.0 pg   MCHC 33.1 30.0 - 36.0 g/dL   RDW 16.0 (H) 11.5 - 15.5 %   Platelets 116 (L) 150 - 400 K/uL    Comment: PLATELET COUNT CONFIRMED BY SMEAR   Neutrophils Relative %  77 43 - 77 %   Neutro Abs 4.4 1.7 - 7.7 K/uL   Lymphocytes Relative 10 (L) 12 - 46 %   Lymphs Abs 0.6 (L) 0.7 - 4.0 K/uL   Monocytes Relative 12 3 - 12 %   Monocytes Absolute 0.7 0.1 - 1.0 K/uL   Eosinophils Relative 0 0 - 5 %   Eosinophils Absolute 0.0 0.0 - 0.7 K/uL   Basophils Relative 0 0 - 1 %   Basophils Absolute 0.0 0.0 - 0.1 K/uL  I-stat troponin, ED     Status: None   Collection Time: 05/21/14  2:40 PM  Result Value Ref Range   Troponin i, poc 0.01 0.00 - 0.08 ng/mL   Comment 3            Comment: Due to the release kinetics of cTnI, a negative result within the first hours of the onset of symptoms does not rule out myocardial infarction with certainty. If myocardial infarction is still suspected, repeat the test at appropriate intervals.   I-Stat CG4 Lactic Acid, ED     Status: Abnormal   Collection Time: 05/21/14  2:41 PM  Result Value Ref Range   Lactic Acid, Venous 6.49 (HH) 0.5 - 2.0 mmol/L   Comment NOTIFIED PHYSICIAN   POC occult blood, ED Provider will collect     Status: None   Collection Time: 05/21/14  4:28 PM  Result Value Ref Range   Fecal Occult Bld NEGATIVE NEGATIVE  I-Stat  CG4 Lactic Acid, ED (not at [facility])     Status: Abnormal   Collection Time: 05/21/14  4:54 PM  Result Value Ref Range   Lactic Acid, Venous 4.24 (HH) 0.5 - 2.0 mmol/L  Urinalysis, Routine w reflex microscopic     Status: Abnormal   Collection Time: 05/21/14  5:09 PM  Result Value Ref Range   Color, Urine YELLOW YELLOW   APPearance CLEAR CLEAR   Specific Gravity, Urine 1.010 1.005 - 1.030   pH 6.0 5.0 - 8.0   Glucose, UA NEGATIVE NEGATIVE mg/dL   Hgb urine dipstick NEGATIVE NEGATIVE   Bilirubin Urine SMALL (A) NEGATIVE   Ketones, ur NEGATIVE NEGATIVE mg/dL   Protein, ur NEGATIVE NEGATIVE mg/dL   Urobilinogen, UA 0.2 0.0 - 1.0 mg/dL   Nitrite NEGATIVE NEGATIVE   Leukocytes, UA NEGATIVE NEGATIVE    Comment: MICROSCOPIC NOT DONE ON URINES WITH NEGATIVE PROTEIN, BLOOD, LEUKOCYTES, NITRITE, OR GLUCOSE <1000 mg/dL.  Brain natriuretic peptide     Status: Abnormal   Collection Time: 05/21/14  5:59 PM  Result Value Ref Range   B Natriuretic Peptide 701.4 (H) 0.0 - 100.0 pg/mL  Protime-INR     Status: Abnormal   Collection Time: 05/21/14  6:00 PM  Result Value Ref Range   Prothrombin Time 17.4 (H) 11.6 - 15.2 seconds   INR 1.41 0.00 - 1.49  Vitamin B12     Status: Abnormal   Collection Time: 05/21/14  6:00 PM  Result Value Ref Range   Vitamin B-12 1676 (H) 211 - 911 pg/mL    Comment: Performed at [facility]  Folate     Status: None   Collection Time: 05/21/14  6:00 PM  Result Value Ref Range   Folate >20.0 ng/mL    Comment: (NOTE) Reference Ranges        Deficient:       0.4 - 3.3 ng/mL        Indeterminate:   3.4 - 5.4 ng/mL  Normal:              > 5.4 ng/mL Performed at [facility]   Iron and TIBC     Status: Abnormal   Collection Time: 05/21/14  6:00 PM  Result Value Ref Range   Iron 15 (L) 42 - 165 ug/dL   TIBC 63 (L) 215 - 435 ug/dL   Saturation Ratios 24 20 - 55 %   UIBC 48 (L) 125 - 400 ug/dL    Comment: Performed at [facility]   Ferritin     Status: Abnormal   Collection Time: 05/21/14  6:00 PM  Result Value Ref Range   Ferritin 344 (H) 22 - 322 ng/mL    Comment: Performed at [facility]  Reticulocytes     Status: Abnormal   Collection Time: 05/21/14  6:00 PM  Result Value Ref Range   Retic Ct Pct 1.6 0.4 - 3.1 %   RBC. 3.09 (L) 4.22 - 5.81 MIL/uL   Retic Count, Manual 49.4 19.0 - 186.0 K/uL  MRSA PCR Screening     Status: None   Collection Time: 05/21/14  9:43 PM  Result Value Ref Range   MRSA by PCR NEGATIVE NEGATIVE    Comment:        The GeneXpert MRSA Assay (FDA approved for NASAL specimens only), is one component of a comprehensive MRSA colonization surveillance program. It is not intended to diagnose MRSA infection nor to guide or monitor treatment for MRSA infections.   Basic metabolic panel     Status: Abnormal   Collection Time: 05/22/14  3:47 AM  Result Value Ref Range   Sodium 140 135 - 145 mmol/L   Potassium 4.5 3.5 - 5.1 mmol/L   Chloride 116 (H) 96 - 112 mmol/L   CO2 21 19 - 32 mmol/L   Glucose, Bld 87 70 - 99 mg/dL   BUN 16 6 - 23 mg/dL   Creatinine, Ser 2.00 (H) 0.50 - 1.35 mg/dL   Calcium 6.5 (L) 8.4 - 10.5 mg/dL   GFR calc non Af Amer 33 (L) >90 mL/min   GFR calc Af Amer 38 (L) >90 mL/min    Comment: (NOTE) The eGFR has been calculated using the CKD EPI equation. This calculation has not been validated in all clinical situations. eGFR's persistently <90 mL/min signify possible Chronic Kidney Disease.    Anion gap 3 (L) 5 - 15  CBC     Status: Abnormal   Collection Time: 05/22/14  3:47 AM  Result Value Ref Range   WBC 4.9 4.0 - 10.5 K/uL   RBC 2.93 (L) 4.22 - 5.81 MIL/uL   Hemoglobin 8.6 (L) 13.0 - 17.0 g/dL   HCT 25.7 (L) 39.0 - 52.0 %   MCV 87.7 78.0 - 100.0 fL   MCH 29.4 26.0 - 34.0 pg   MCHC 33.5 30.0 - 36.0 g/dL   RDW 16.0 (H) 11.5 - 15.5 %   Platelets 112 (L) 150 - 400 K/uL    Comment: CONSISTENT WITH PREVIOUS RESULT    Imaging / Studies: Ct  Abdomen Pelvis Wo Contrast  05/21/2014   CLINICAL DATA:  Right lower extremity pain and swelling. Hypotension. Decreased hemoglobin.  EXAM: CT ABDOMEN AND PELVIS WITHOUT CONTRAST  TECHNIQUE: Multidetector CT imaging of the abdomen and pelvis was performed following the standard protocol without IV contrast.  COMPARISON:  CT abdomen and pelvis 05/12/2006.  FINDINGS: The patient has small bilateral pleural effusions. No pericardial effusion is noted.  Calcific coronary artery disease is seen.  There is marked fatty infiltration of the liver diffusely. The gallbladder and right adrenal gland are unremarkable. The tail of the pancreas appears expanded and is contiguous with the upper pole the left kidney and splenic hilum. There may be a mass in the pancreatic tail. Additionally, anterior and lateral to the neck of the pancreas, there is a low attenuating focus measuring 2.3 cm AP x 2.7 cm transverse on image 36 with Hounsfield unit of measurements of 10.9. A low attenuating left adrenal nodule consistent with an adenoma is unchanged. A hyper attenuating lesion the midpole left kidney measures 1.4 cm in diameter and likely represents a complex cyst but cannot be definitively characterized.  Along the descending duodenum, the patient appears to have a duodenum diverticulum. The small bowel is otherwise unremarkable. The walls of the colon are diffusely thickened. While this could be secondary to colitis, this may be related to the patient's hepatic insufficiency as changes are most notable in the ascending and proximal transverse colon. A small volume of upper abdominal ascites is identified. Extensive aortoiliac atherosclerosis is noted.  In the left buttock, there is partial visualization of a lesion measuring 4.4 cm transverse by 3.2 cm AP on image 94. Diffuse body wall edema is noted. No lytic or sclerotic bony lesion is seen.  IMPRESSION: Diffuse and marked fatty infiltration of the liver. Small volume of perisplenic  ascites is likely related to hepatic insufficiency.  Expanded tail of the pancreas which is contiguous with the left kidney and splenic hilum is not included on prior studies. Mass in the pancreatic tail is possible. A low attenuating lesion off the neck of the pancreas anterior and laterally may be a pancreatic pseudocyst but cannot be definitively characterized. MRI of the abdomen with contrast is recommended. MRI should be deferred until the patient is able to follow breath holding instructions. This could be performed as an outpatient.  Wall thickening of the colon most notable in the ascending and transverse is likely related to hepatic insufficiency although colitis could create a similar appearance.  Low attenuating lesion in the left buttock is worrisome for abscess.  Anasarca.  Extensive atherosclerosis.   Electronically Signed   By: Inge Rise M.D.   On: 05/21/2014 19:25   Dg Chest 1 View  05/21/2014   CLINICAL DATA:  Bilateral leg swelling and shortness of breath.  EXAM: CHEST  1 VIEW  COMPARISON:  02/07/2014  FINDINGS: Artifact overlies chest. The patient has taken a poor inspiration. Heart size is at the upper limits of normal. Mediastinal shadows are normal. There is probably pulmonary venous hypertension but there is no frank edema. No effusions. No collapse or infiltrate.  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  No frank edema.   Electronically Signed   By: Nelson Chimes M.D.   On: 05/21/2014 15:29    Medications / Allergies:  Scheduled Meds: . clindamycin (CLEOCIN) IV  600 mg Intravenous M4W  . folic acid  1 mg Oral Daily  . heparin  5,000 Units Subcutaneous 3 times per day  . LORazepam  0-4 mg Intravenous Q6H   Followed by  . [START ON 05/23/2014] LORazepam  0-4 mg Intravenous Q12H  . multivitamin with minerals  1 tablet Oral Daily  . pantoprazole  40 mg Oral Daily  . piperacillin-tazobactam (ZOSYN)  IV  3.375 g Intravenous Q8H  . thiamine  100 mg Oral Daily   Or   . thiamine  100 mg Intravenous Daily  .  vancomycin  1,000 mg Intravenous Q24H   Continuous Infusions: . sodium chloride 75 mL/hr at 05/21/14 2030   PRN Meds:.acetaminophen **OR** acetaminophen, albuterol, guaiFENesin-dextromethorphan, LORazepam **OR** LORazepam, morphine injection, ondansetron **OR** ondansetron (ZOFRAN) IV, oxyCODONE  Antibiotics: Anti-infectives    Start     Dose/Rate Route Frequency Ordered Stop   05/22/14 1800  vancomycin (VANCOCIN) IVPB 1000 mg/200 mL premix     1,000 mg 200 mL/hr over 60 Minutes Intravenous Every 24 hours 05/21/14 1627     05/21/14 2300  piperacillin-tazobactam (ZOSYN) IVPB 3.375 g     3.375 g 12.5 mL/hr over 240 Minutes Intravenous Every 8 hours 05/21/14 1754     05/21/14 1800  clindamycin (CLEOCIN) IVPB 600 mg     600 mg 100 mL/hr over 30 Minutes Intravenous Every 8 hours 05/21/14 1754     05/21/14 1630  vancomycin (VANCOCIN) 1,500 mg in sodium chloride 0.9 % 500 mL IVPB     1,500 mg 250 mL/hr over 120 Minutes Intravenous  Once 05/21/14 1625 05/21/14 1912   05/21/14 1615  piperacillin-tazobactam (ZOSYN) IVPB 3.375 g     3.375 g 100 mL/hr over 30 Minutes Intravenous  Once 05/21/14 1610 05/21/14 1712        Assessment/Plan 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  Do not think this is the cause of his sepsis.  Will discuss with Dr. Georgette Dover is a bedside I&D is needed, but unlikely given adequate drainage.  Will follow along.    Erby Pian, [facility] Pager [phone]) For consults and floor pages call [phone]([id number])  05/22/2014 8:07 AM

## 2014-05-23 ENCOUNTER — Inpatient Hospital Stay (HOSPITAL_COMMUNITY): Payer: Medicare Other

## 2014-05-23 DIAGNOSIS — K219 Gastro-esophageal reflux disease without esophagitis: Secondary | ICD-10-CM

## 2014-05-23 DIAGNOSIS — E43 Unspecified severe protein-calorie malnutrition: Secondary | ICD-10-CM

## 2014-05-23 DIAGNOSIS — K7031 Alcoholic cirrhosis of liver with ascites: Secondary | ICD-10-CM

## 2014-05-23 DIAGNOSIS — F1010 Alcohol abuse, uncomplicated: Secondary | ICD-10-CM

## 2014-05-23 DIAGNOSIS — I10 Essential (primary) hypertension: Secondary | ICD-10-CM

## 2014-05-23 LAB — BODY FLUID CELL COUNT WITH DIFFERENTIAL
EOS FL: 0 %
Lymphs, Fluid: 18 %
Monocyte-Macrophage-Serous Fluid: 72 % (ref 50–90)
NEUTROPHIL FLUID: 10 % (ref 0–25)
OTHER CELLS FL: 0 %
Total Nucleated Cell Count, Fluid: 18 cu mm (ref 0–1000)

## 2014-05-23 LAB — COMPREHENSIVE METABOLIC PANEL
ALT: 39 U/L (ref 0–53)
ANION GAP: 7 (ref 5–15)
AST: 53 U/L — ABNORMAL HIGH (ref 0–37)
Albumin: 1 g/dL — ABNORMAL LOW (ref 3.5–5.2)
Alkaline Phosphatase: 134 U/L — ABNORMAL HIGH (ref 39–117)
BILIRUBIN TOTAL: 1.4 mg/dL — AB (ref 0.3–1.2)
BUN: 11 mg/dL (ref 6–23)
CALCIUM: 6.5 mg/dL — AB (ref 8.4–10.5)
CO2: 20 mmol/L (ref 19–32)
CREATININE: 1.93 mg/dL — AB (ref 0.50–1.35)
Chloride: 114 mmol/L — ABNORMAL HIGH (ref 96–112)
GFR calc Af Amer: 40 mL/min — ABNORMAL LOW (ref >90)
GFR, EST NON AFRICAN AMERICAN: 34 mL/min — AB (ref >90)
GLUCOSE: 81 mg/dL (ref 70–99)
POTASSIUM: 3.5 mmol/L (ref 3.5–5.1)
Sodium: 141 mmol/L (ref 135–145)
TOTAL PROTEIN: 3.8 g/dL — AB (ref 6.0–8.3)

## 2014-05-23 LAB — CBC
HCT: 23.3 % — ABNORMAL LOW (ref 39.0–52.0)
Hemoglobin: 8 g/dL — ABNORMAL LOW (ref 13.0–17.0)
MCH: 29.7 pg (ref 26.0–34.0)
MCHC: 34.3 g/dL (ref 30.0–36.0)
MCV: 86.6 fL (ref 78.0–100.0)
PLATELETS: 101 10*3/uL — AB (ref 150–400)
RBC: 2.69 MIL/uL — AB (ref 4.22–5.81)
RDW: 16.1 % — ABNORMAL HIGH (ref 11.5–15.5)
WBC: 3.4 10*3/uL — ABNORMAL LOW (ref 4.0–10.5)

## 2014-05-23 LAB — PROTEIN, BODY FLUID: Total protein, fluid: <3 g/dL

## 2014-05-23 MED ORDER — LIDOCAINE-EPINEPHRINE 1 %-1:100000 IJ SOLN
20.0000 mL | Freq: Once | INTRAMUSCULAR | Status: AC
  Administered 2014-05-23: 1 mL via INTRADERMAL
  Filled 2014-05-23: qty 20

## 2014-05-23 MED ORDER — SODIUM CHLORIDE 0.9 % IV SOLN
INTRAVENOUS | Status: AC
  Administered 2014-05-23: 19:00:00 via INTRAVENOUS

## 2014-05-23 MED ORDER — PIPERACILLIN-TAZOBACTAM 3.375 G IVPB
3.3750 g | Freq: Three times a day (TID) | INTRAVENOUS | Status: DC
  Administered 2014-05-23 – 2014-05-24 (×2): 3.375 g via INTRAVENOUS
  Filled 2014-05-23 (×3): qty 50

## 2014-05-23 MED ORDER — LIDOCAINE HCL (PF) 1 % IJ SOLN
INTRAMUSCULAR | Status: AC
Start: 2014-05-23 — End: 2014-05-24
  Filled 2014-05-23: qty 10

## 2014-05-23 MED ORDER — CALCIUM CARBONATE-VITAMIN D 500-200 MG-UNIT PO TABS
1.0000 | ORAL_TABLET | Freq: Every day | ORAL | Status: DC
  Administered 2014-05-24 – 2014-05-27 (×4): 1 via ORAL
  Filled 2014-05-23 (×6): qty 1

## 2014-05-23 MED ORDER — LIDOCAINE HCL (PF) 1 % IJ SOLN
INTRAMUSCULAR | Status: AC
  Filled 2014-05-23: qty 10

## 2014-05-23 NOTE — Procedures (Signed)
Incision and Drainage Procedure Note  Pre-operative Diagnosis: left gluteal abscess  Post-operative Diagnosis: same  Indications: 43 yom with multiple medical problems including ongoing alcohol abuse, prior dvt who arrives with a two week complaint of fatigue and weak lower extremities. He has had chills and is worried about a rash that has developed. He states that he is having bms, no n/v and has been eating lately. He denies fevers. He states he may have a had a perianal abscess previously. He is getting admitted by medical team for possible sepsis and concern for perianal abscess. The patient does not complain about it  Anesthesia: 1% lidocaine with epinephrine  Procedure Details  The procedure, risks and complications have been discussed in detail (including, but not limited to airway compromise, infection, bleeding) with the patient, and the patient has signed consent to the procedure.  The skin was sterilely prepped and draped over the affected area in the usual fashion. After adequate local anesthesia, I&D with a #11 blade was performed on the left buttock. Purulent drainage was present.  He had a much larger pocket than anticipated.  I therefore incised an area 3x3x1cm with additional undermining which was 2cm.  I felt that this area could be adequately packet, I packet with a 4x4 and covered with guaze and tape.  Hemostasis was ensured prior to packing the wound. The patient was observed until stable.  Findings: As above   EBL: <10 cc's  Drains: none  Condition: Tolerated procedure well and Stable   Complications: none.

## 2014-05-23 NOTE — Progress Notes (Signed)
Utilization Review Completed.  

## 2014-05-23 NOTE — Procedures (Signed)
RLQ paracemtesis 400 cc  No comp

## 2014-05-23 NOTE — Progress Notes (Signed)
05/20/2014 patient had a ultrasound paracentesis site was check. Patient bandaid was off have a small incision on the right lower quadrant and site is clean. [facility] RN.

## 2014-05-23 NOTE — Progress Notes (Signed)
[facility] PROGRESS NOTE  Justin Gilmore GUR:[id number] DOB: February 08, 1946 DOA: 05/21/2014 PCP: Robert Bellow, MD  Assessment/Plan: Principal Problem:   Sepsis Active Problems:   Hypertension   GERD (gastroesophageal reflux disease)   ETOH abuse   Atherosclerosis of native arteries of extremity with intermittent claudication   Lower extremity edema   Perianal abscess   Severe Sepsis: Suspected secondary to left perianal abscess.  -General surgery helping with I & D -continue current IV antibiotics and supportive care -C. Difficile negative -no leukocytosis  Fatty infiltration of the liver/small pseudocyst vs mass in the tail of the pancreas/Ascites -Liver function normal -Lipase within normal limits -so far unsuccessful MRI abd/MRCP -will get US paracentesis and follow fluid cytology and results  . Acute on chronic renal failure stage III: Suspect acute renal failure likely prerenal azotemia -Cr trending down after IVF's -will minimize use of nephrotoxic agents  . Lower extremity edema: Has significant hypoalbuminemia-suspect this may be from underlying undiagnosed cirrhosis given significant history of alcohol abuse.  -ABI with moderate arterial disease -holding diuretics given renal failure -Venous Doppler negative -follow 2-D echo   . Perianal abscess:  -Plan is for I & D by [facility] -continue antibiotics and supportive care  . Anemia:  -combination of mild iron deficiency with AOCD and chronic alcohol use -once infection controlled will start iron supplementation -patient will also need B supplements -no transfusion need currently, but will follow Hgb trend -stool guaiac negative  . Thrombocytopenia: Perhaps secondary to presumed liver cirrhosis and from heavy alcohol drinking -no signs of acute bleeding -follow platelets trend  . GERD (gastroesophageal reflux disease): Continue PPI   . Atherosclerosis of native arteries of extremity with intermittent  claudication: Has had prior bypass surgery in his right lower extremities.  -Both lower extremities are significantly swollen, but appear warm to touch -ABI demonstrating moderate arterial disease; will need outpatient follow up with vascular surgery -advised to quit smoking  . Hypertension: BP is stable but soft -continue heart healthy diet -continue holding antihypertensive agents  . ETOH abuse: cessation counseling provided, Ativan per [facility] protocol  . Severe Malnutrition: Consult dietitian -will follow rec's and start nutrition supplements    Code Status: full Family Communication: family updated about patient's clinical progress Disposition Plan:  To be determine    Brief narrative: Justin Gilmore is a 69 y.o. male with a Past Medical History of peripheral vascular disease status post right leg bypass in 2012, ongoing alcohol abuse, hypertension, prior history of DVT not on Coumadin who presents today with the above noted complaint. Patient claims that for the past week or so he has been much more weaker than usual, claims that his bilateral lower extremities have had significant swelling than his usual baseline. He also claims that he thinks that his right leg is not just holding up right, and has had problems ambulating with that particular leg. Per family/patient patient is very minimally ambulatory and only/barely walks around the house. He also complains that he has noticed a rash in his bilateral lower extremities. Denies any pain. He denies any fever-but patient's girlfriend does claim to the patient's been having chills. Patient does not complain of shortness of breath at rest, however his family has noticed some mild dyspnea on exertion. Patient does claim that he has PND and gets up multiple times at night because of shortness of breath. He however denies orthopnea. No history of nausea or vomiting. He claims he had 2 loose stools yesterday. No chest pain.  He continues to drink  around 2-312 ounce cans of beer on a daily basis along with a shot of liquor. He continues to smoke  Consultants:  General surgery  IR  Procedures:  None  Antibiotics: Clindamycin, Zosyn, vancomycin started 3/7  HPI/Subjective: Patient with low grade temp, complaining of abd distension and leg swelling. Also with mild left buttock pain on exam  Objective: Filed Vitals:   05/23/14 1153 05/23/14 1500 05/23/14 1620 05/23/14 1638  BP: 103/77 129/72 126/74 127/73  Pulse: 99 85    Temp: 98.4 F (36.9 C) 98.6 F (37 C)    TempSrc: Oral Oral    Resp: 18 27    Height:      Weight:      SpO2: 99% 97%      Intake/Output Summary (Last 24 hours) at 05/23/14 1832 Last data filed at 05/23/14 1500  Gross per 24 hour  Intake    750 ml  Output    650 ml  Net    100 ml    Exam:  General: No acute respiratory disstres; low grade temp; feeling better Lungs: positive rhonchi, no wheezing; good air movement Cardiovascular: Regular rate and rhythm without murmur gallop or rub normal S1 and S2 Abdomen: Nontender, mild to moderate distension on exam with positive wave signs. Positive bowel sounds positive Extremities: No cyanosis or clubbing; positive edema (2++ bilaterally) Skin: left buttock induration with mild tenderness and suppuration   Data Reviewed: Basic Metabolic Panel:  Recent Labs Lab 05/21/14 1345 05/22/14 0347 05/23/14 0425  NA 138 140 141  K 4.9 4.5 3.5  CL 109 116* 114*  CO2 18* 21 20  GLUCOSE 132* 87 81  BUN 17 16 11   CREATININE 2.17* 2.00* 1.93*  CALCIUM 6.6* 6.5* 6.5*    Liver Function Tests:  Recent Labs Lab 05/21/14 1345 05/22/14 0347 05/23/14 0425  AST 43* 51* 53*  ALT 38 35 39  ALKPHOS 150* 143* 134*  BILITOT 1.2 1.2 1.4*  PROT 4.0* 3.9* 3.8*  ALBUMIN 1.2* 1.1* 1.0*    Recent Labs Lab 05/22/14 0347  LIPASE 13   CBC:  Recent Labs Lab 05/21/14 1345 05/22/14 0347 05/23/14 0425  WBC 5.7 4.9 3.4*  NEUTROABS 4.4  --   --   HGB  8.9* 8.6* 8.0*  HCT 26.9* 25.7* 23.3*  MCV 90.9 87.7 86.6  PLT 116* 112* 101*   BNP (last 3 results)  Recent Labs  05/21/14 1759  BNP 701.4*    ProBNP (last 3 results) No results for input(s): PROBNP in the last 8760 hours.    CBG: No results for input(s): GLUCAP in the last 168 hours.  Recent Results (from the past 240 hour(s))  Blood culture (routine x 2)     Status: None (Preliminary result)   Collection Time: 05/21/14  4:18 PM  Result Value Ref Range Status   Specimen Description BLOOD HAND LEFT  Final   Special Requests BOTTLES DRAWN AEROBIC AND ANAEROBIC 5CC  Final   Culture   Final           BLOOD CULTURE RECEIVED NO GROWTH TO DATE CULTURE WILL BE HELD FOR 5 DAYS BEFORE ISSUING A FINAL NEGATIVE REPORT Performed at [facility]    Report Status PENDING  Incomplete  Blood culture (routine x 2)     Status: None (Preliminary result)   Collection Time: 05/21/14  4:27 PM  Result Value Ref Range Status   Specimen Description BLOOD HAND RIGHT  Final  Special Requests BOTTLES DRAWN AEROBIC AND ANAEROBIC 3CC  Final   Culture   Final           BLOOD CULTURE RECEIVED NO GROWTH TO DATE CULTURE WILL BE HELD FOR 5 DAYS BEFORE ISSUING A FINAL NEGATIVE REPORT Performed at [facility]    Report Status PENDING  Incomplete  Urine culture     Status: None (Preliminary result)   Collection Time: 05/21/14  5:09 PM  Result Value Ref Range Status   Specimen Description URINE, CLEAN CATCH  Final   Special Requests NONE  Final   Colony Count   Final    >=100,000 COLONIES/ML Performed at [facility]    Culture   Final    [place] Performed at [facility]    Report Status PENDING  Incomplete  MRSA PCR Screening     Status: None   Collection Time: 05/21/14  9:43 PM  Result Value Ref Range Status   MRSA by PCR NEGATIVE NEGATIVE Final    Comment:        The GeneXpert MRSA Assay (FDA approved for NASAL specimens only), is one  component of a comprehensive MRSA colonization surveillance program. It is not intended to diagnose MRSA infection nor to guide or monitor treatment for MRSA infections.   Clostridium Difficile by PCR     Status: None   Collection Time: 05/22/14 12:33 PM  Result Value Ref Range Status   C difficile by pcr NEGATIVE NEGATIVE Final     Studies: Ct Abdomen Pelvis Wo Contrast  05/21/2014   CLINICAL DATA:  Right lower extremity pain and swelling. Hypotension. Decreased hemoglobin.  EXAM: CT ABDOMEN AND PELVIS WITHOUT CONTRAST  TECHNIQUE: Multidetector CT imaging of the abdomen and pelvis was performed following the standard protocol without IV contrast.  COMPARISON:  CT abdomen and pelvis 05/12/2006.  FINDINGS: The patient has small bilateral pleural effusions. No pericardial effusion is noted. Calcific coronary artery disease is seen.  There is marked fatty infiltration of the liver diffusely. The gallbladder and right adrenal gland are unremarkable. The tail of the pancreas appears expanded and is contiguous with the upper pole the left kidney and splenic hilum. There may be a mass in the pancreatic tail. Additionally, anterior and lateral to the neck of the pancreas, there is a low attenuating focus measuring 2.3 cm AP x 2.7 cm transverse on image 36 with Hounsfield unit of measurements of 10.9. A low attenuating left adrenal nodule consistent with an adenoma is unchanged. A hyper attenuating lesion the midpole left kidney measures 1.4 cm in diameter and likely represents a complex cyst but cannot be definitively characterized.  Along the descending duodenum, the patient appears to have a duodenum diverticulum. The small bowel is otherwise unremarkable. The walls of the colon are diffusely thickened. While this could be secondary to colitis, this may be related to the patient's hepatic insufficiency as changes are most notable in the ascending and proximal transverse colon. A small volume of upper  abdominal ascites is identified. Extensive aortoiliac atherosclerosis is noted.  In the left buttock, there is partial visualization of a lesion measuring 4.4 cm transverse by 3.2 cm AP on image 94. Diffuse body wall edema is noted. No lytic or sclerotic bony lesion is seen.  IMPRESSION: Diffuse and marked fatty infiltration of the liver. Small volume of perisplenic ascites is likely related to hepatic insufficiency.  Expanded tail of the pancreas which is contiguous with the left kidney and splenic hilum  is not included on prior studies. Mass in the pancreatic tail is possible. A low attenuating lesion off the neck of the pancreas anterior and laterally may be a pancreatic pseudocyst but cannot be definitively characterized. MRI of the abdomen with contrast is recommended. MRI should be deferred until the patient is able to follow breath holding instructions. This could be performed as an outpatient.  Wall thickening of the colon most notable in the ascending and transverse is likely related to hepatic insufficiency although colitis could create a similar appearance.  Low attenuating lesion in the left buttock is worrisome for abscess.  Anasarca.  Extensive atherosclerosis.   Electronically Signed   By: Inge Rise M.D.   On: 05/21/2014 19:25   Dg Chest 1 View  05/21/2014   CLINICAL DATA:  Bilateral leg swelling and shortness of breath.  EXAM: CHEST  1 VIEW  COMPARISON:  02/07/2014  FINDINGS: Artifact overlies chest. The patient has taken a poor inspiration. Heart size is at the upper limits of normal. Mediastinal shadows are normal. There is probably pulmonary venous hypertension but there is no frank edema. No effusions. No collapse or infiltrate.  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  No frank edema.   Electronically Signed   By: Nelson Chimes M.D.   On: 05/21/2014 15:29   Dg Pelvis 1-2 Views  05/22/2014   CLINICAL DATA:  Right buttock abscess.  EXAM: PELVIS - 1-2 VIEW  COMPARISON:   05/21/2014  FINDINGS: Oral contrast material demonstrated within the descending and sigmoid colon. Lower lumbar spine degenerative change. Osseous pelvis is unremarkable. Vascular calcifications.  IMPRESSION: Lumbar spine degenerative changes.  Osseous skeleton are unremarkable.   Electronically Signed   By: Lovey Newcomer M.D.   On: 05/22/2014 10:23   Mr Hip Right Wo Contrast  05/22/2014   CLINICAL DATA:  Right hip pain. History of perianal abscess, alcoholic cirrhosis and renal failure. Evaluate for avascular necrosis. Initial encounter.  EXAM: MR OF THE RIGHT HIP WITHOUT CONTRAST  TECHNIQUE: Multiplanar, multisequence MR imaging was performed. No intravenous contrast was administered.  COMPARISON:  Pelvic radiographs 05/2014.  Pelvic CT 05/21/2014.  FINDINGS: Study is mildly motion degraded. Image quality is also degraded by diffuse soft tissue edema.  Bones: Both femoral heads appear normal without evidence of acute fracture, dislocation or avascular necrosis. No focal or acute abnormalities are identified within the bony pelvis. The bone marrow is diffusely heterogeneous with mottled T1 hypo intensities, most pronounced within the lumbar spine. There is no suspicious marrow lesion on inversion recovery imaging, and this is probably due to osteoporosis. There is no evidence of sacroiliitis.  Articular cartilage and labrum  Articular cartilage: No focal chondral defect or subchondral signal abnormality identified.  Labrum: There is no gross labral tear or paralabral abnormality.  Joint or bursal effusion  Joint effusion: Small bilateral hip joint effusions.  Bursae: No focal periarticular fluid collections identified. However, there is extensive subcutaneous edema throughout the pelvis and proximal thighs. There is also mild muscular edema bilaterally.  Muscles and tendons  Muscles and tendons: The gluteus, hamstring and iliopsoas tendons appear intact. The piriformis muscles are symmetric. As above, there is mild  diffuse muscular edema throughout the pelvis.  Other findings  Miscellaneous: The previously demonstrated left perianal subcutaneous fluid collection is incompletely visualized by the current examination, although grossly stable. There is mild pelvic ascites which appears new. 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  IMPRESSION: 1. No evidence of femoral head avascular necrosis,  osteomyelitis or acute osseous findings. 2. Progressive anasarca with generalized soft tissue edema, pelvic ascites and small bilateral hip joint effusions. No new focal fluid collections identified. The left perianal fluid collection is grossly stable, although incompletely visualized.   Electronically Signed   By: Richardean Sale M.D.   On: 05/22/2014 14:19   Dg Chest Port 1 View  05/22/2014   CLINICAL DATA:  Shortness of breath.  EXAM: PORTABLE CHEST - 1 VIEW  COMPARISON:  Chest radiograph 05/21/2014  FINDINGS: Monitoring leads overlie the patient. Stable cardiac and mediastinal contours. No consolidative pulmonary opacities. No pleural effusion or pneumothorax. Regional skeleton is unremarkable.  IMPRESSION: No acute cardiopulmonary process.   Electronically Signed   By: Lovey Newcomer M.D.   On: 05/22/2014 09:57    Scheduled Meds: . [START ON 05/24/2014] calcium-vitamin D  1 tablet Oral Q breakfast  . clindamycin (CLEOCIN) IV  600 mg Intravenous K5L  . folic acid  1 mg Oral Daily  . heparin  5,000 Units Subcutaneous 3 times per day  . lidocaine (PF)      . lidocaine (PF)      . LORazepam  0-4 mg Intravenous Q6H   Followed by  . LORazepam  0-4 mg Intravenous Q12H  . multivitamin with minerals  1 tablet Oral Daily  . pantoprazole  40 mg Oral Daily  . piperacillin-tazobactam (ZOSYN)  IV  3.375 g Intravenous Q8H  . thiamine  100 mg Oral Daily   Or  . thiamine  100 mg Intravenous Daily  . vancomycin  1,000 mg Intravenous Q24H   Continuous Infusions: . sodium chloride      Principal  Problem:   Sepsis Active Problems:   Hypertension   GERD (gastroesophageal reflux disease)   ETOH abuse   Atherosclerosis of native arteries of extremity with intermittent claudication   Lower extremity edema   Perianal abscess    Time spent: 35 minutes   Barton Dubois  [facility] Pager [phone]. If 7PM-7AM, please contact night-coverage at [email or web address], password [facility] 05/23/2014, 6:32 PM  LOS: 2 days

## 2014-05-23 NOTE — Progress Notes (Signed)
Patient ID: Justin Gilmore, male   DOB: 10-15-1945, 69 y.o.   MRN: [id number]     [facility]      [address] [place], [place] [address]    Phone: [phone] FAX: [phone]     Subjective: Afebrile.  VSS.    Objective:  Vital signs:  Filed Vitals:   05/22/14 2000 05/23/14 0000 05/23/14 0444 05/23/14 0731  BP:   107/51 119/66  Pulse:   88 92  Temp: 98.4 F (36.9 C) 98.2 F (36.8 C) 98.5 F (36.9 C) 98.3 F (36.8 C)  TempSrc: Oral Oral Oral Oral  Resp:   24 24  Height:      Weight:      SpO2:   93% 95%    Last BM Date: 05/21/14  Intake/Output   Yesterday:  03/08 0701 - 03/09 0700 In: 1350 [I.V.:1250; IV Piggyback:100] Out: 300 [Urine:300] This shift:  Total I/O In: -  Out: 200 [Urine:200]   Physical Exam: General: Pt awake/alert/oriented x4 in no acute distress Skin: less drainage, purulent drainage with small residual abscess.    Problem List:   Principal Problem:   Sepsis Active Problems:   Hypertension   GERD (gastroesophageal reflux disease)   ETOH abuse   Atherosclerosis of native arteries of extremity with intermittent claudication   Lower extremity edema   Perianal abscess    Results:   Labs: Results for orders placed or performed during the hospital encounter of 05/21/14 (from the past 48 hour(s))  Comprehensive metabolic panel     Status: Abnormal   Collection Time: 05/21/14  1:45 PM  Result Value Ref Range   Sodium 138 135 - 145 mmol/L   Potassium 4.9 3.5 - 5.1 mmol/L    Comment: SLIGHT HEMOLYSIS   Chloride 109 96 - 112 mmol/L   CO2 18 (L) 19 - 32 mmol/L   Glucose, Bld 132 (H) 70 - 99 mg/dL   BUN 17 6 - 23 mg/dL   Creatinine, Ser 2.17 (H) 0.50 - 1.35 mg/dL   Calcium 6.6 (L) 8.4 - 10.5 mg/dL   Total Protein 4.0 (L) 6.0 - 8.3 g/dL   Albumin 1.2 (L) 3.5 - 5.2 g/dL   AST 43 (H) 0 - 37 U/L   ALT 38 0 - 53 U/L   Alkaline Phosphatase 150 (H) 39 - 117 U/L   Total Bilirubin 1.2 0.3 -  1.2 mg/dL   GFR calc non Af Amer 30 (L) >90 mL/min   GFR calc Af Amer 34 (L) >90 mL/min    Comment: (NOTE) The eGFR has been calculated using the CKD EPI equation. This calculation has not been validated in all clinical situations. eGFR's persistently <90 mL/min signify possible Chronic Kidney Disease.    Anion gap 11 5 - 15  CBC with Differential     Status: Abnormal   Collection Time: 05/21/14  1:45 PM  Result Value Ref Range   WBC 5.7 4.0 - 10.5 K/uL   RBC 2.96 (L) 4.22 - 5.81 MIL/uL   Hemoglobin 8.9 (L) 13.0 - 17.0 g/dL   HCT 26.9 (L) 39.0 - 52.0 %   MCV 90.9 78.0 - 100.0 fL   MCH 30.1 26.0 - 34.0 pg   MCHC 33.1 30.0 - 36.0 g/dL   RDW 16.0 (H) 11.5 - 15.5 %   Platelets 116 (L) 150 - 400 K/uL    Comment: PLATELET COUNT CONFIRMED BY SMEAR   Neutrophils Relative % 77 43 -  77 %   Neutro Abs 4.4 1.7 - 7.7 K/uL   Lymphocytes Relative 10 (L) 12 - 46 %   Lymphs Abs 0.6 (L) 0.7 - 4.0 K/uL   Monocytes Relative 12 3 - 12 %   Monocytes Absolute 0.7 0.1 - 1.0 K/uL   Eosinophils Relative 0 0 - 5 %   Eosinophils Absolute 0.0 0.0 - 0.7 K/uL   Basophils Relative 0 0 - 1 %   Basophils Absolute 0.0 0.0 - 0.1 K/uL  I-stat troponin, ED     Status: None   Collection Time: 05/21/14  2:40 PM  Result Value Ref Range   Troponin i, poc 0.01 0.00 - 0.08 ng/mL   Comment 3            Comment: Due to the release kinetics of cTnI, a negative result within the first hours of the onset of symptoms does not rule out myocardial infarction with certainty. If myocardial infarction is still suspected, repeat the test at appropriate intervals.   I-Stat CG4 Lactic Acid, ED     Status: Abnormal   Collection Time: 05/21/14  2:41 PM  Result Value Ref Range   Lactic Acid, Venous 6.49 (HH) 0.5 - 2.0 mmol/L   Comment NOTIFIED PHYSICIAN   POC occult blood, ED Provider will collect     Status: None   Collection Time: 05/21/14  4:28 PM  Result Value Ref Range   Fecal Occult Bld NEGATIVE NEGATIVE  I-Stat CG4  Lactic Acid, ED (not at [facility])     Status: Abnormal   Collection Time: 05/21/14  4:54 PM  Result Value Ref Range   Lactic Acid, Venous 4.24 (HH) 0.5 - 2.0 mmol/L  Urinalysis, Routine w reflex microscopic     Status: Abnormal   Collection Time: 05/21/14  5:09 PM  Result Value Ref Range   Color, Urine YELLOW YELLOW   APPearance CLEAR CLEAR   Specific Gravity, Urine 1.010 1.005 - 1.030   pH 6.0 5.0 - 8.0   Glucose, UA NEGATIVE NEGATIVE mg/dL   Hgb urine dipstick NEGATIVE NEGATIVE   Bilirubin Urine SMALL (A) NEGATIVE   Ketones, ur NEGATIVE NEGATIVE mg/dL   Protein, ur NEGATIVE NEGATIVE mg/dL   Urobilinogen, UA 0.2 0.0 - 1.0 mg/dL   Nitrite NEGATIVE NEGATIVE   Leukocytes, UA NEGATIVE NEGATIVE    Comment: MICROSCOPIC NOT DONE ON URINES WITH NEGATIVE PROTEIN, BLOOD, LEUKOCYTES, NITRITE, OR GLUCOSE <1000 mg/dL.  Urine culture     Status: None (Preliminary result)   Collection Time: 05/21/14  5:09 PM  Result Value Ref Range   Specimen Description URINE, CLEAN CATCH    Special Requests NONE    Colony Count PENDING    Culture      Culture reincubated for better growth Performed at [facility]    Report Status PENDING   Brain natriuretic peptide     Status: Abnormal   Collection Time: 05/21/14  5:59 PM  Result Value Ref Range   B Natriuretic Peptide 701.4 (H) 0.0 - 100.0 pg/mL  Protime-INR     Status: Abnormal   Collection Time: 05/21/14  6:00 PM  Result Value Ref Range   Prothrombin Time 17.4 (H) 11.6 - 15.2 seconds   INR 1.41 0.00 - 1.49  Vitamin B12     Status: Abnormal   Collection Time: 05/21/14  6:00 PM  Result Value Ref Range   Vitamin B-12 1676 (H) 211 - 911 pg/mL    Comment: Performed at [place]  Status: None   Collection Time: 05/21/14  6:00 PM  Result Value Ref Range   Folate >20.0 ng/mL    Comment: (NOTE) Reference Ranges        Deficient:       0.4 - 3.3 ng/mL        Indeterminate:   3.4 - 5.4 ng/mL        Normal:              > 5.4  ng/mL Performed at [facility]   Iron and TIBC     Status: Abnormal   Collection Time: 05/21/14  6:00 PM  Result Value Ref Range   Iron 15 (L) 42 - 165 ug/dL   TIBC 63 (L) 215 - 435 ug/dL   Saturation Ratios 24 20 - 55 %   UIBC 48 (L) 125 - 400 ug/dL    Comment: Performed at [facility]  Ferritin     Status: Abnormal   Collection Time: 05/21/14  6:00 PM  Result Value Ref Range   Ferritin 344 (H) 22 - 322 ng/mL    Comment: Performed at [facility]  Reticulocytes     Status: Abnormal   Collection Time: 05/21/14  6:00 PM  Result Value Ref Range   Retic Ct Pct 1.6 0.4 - 3.1 %   RBC. 3.09 (L) 4.22 - 5.81 MIL/uL   Retic Count, Manual 49.4 19.0 - 186.0 K/uL  MRSA PCR Screening     Status: None   Collection Time: 05/21/14  9:43 PM  Result Value Ref Range   MRSA by PCR NEGATIVE NEGATIVE    Comment:        The GeneXpert MRSA Assay (FDA approved for NASAL specimens only), is one component of a comprehensive MRSA colonization surveillance program. It is not intended to diagnose MRSA infection nor to guide or monitor treatment for MRSA infections.   Basic metabolic panel     Status: Abnormal   Collection Time: 05/22/14  3:47 AM  Result Value Ref Range   Sodium 140 135 - 145 mmol/L   Potassium 4.5 3.5 - 5.1 mmol/L   Chloride 116 (H) 96 - 112 mmol/L   CO2 21 19 - 32 mmol/L   Glucose, Bld 87 70 - 99 mg/dL   BUN 16 6 - 23 mg/dL   Creatinine, Ser 2.00 (H) 0.50 - 1.35 mg/dL   Calcium 6.5 (L) 8.4 - 10.5 mg/dL   GFR calc non Af Amer 33 (L) >90 mL/min   GFR calc Af Amer 38 (L) >90 mL/min    Comment: (NOTE) The eGFR has been calculated using the CKD EPI equation. This calculation has not been validated in all clinical situations. eGFR's persistently <90 mL/min signify possible Chronic Kidney Disease.    Anion gap 3 (L) 5 - 15  CBC     Status: Abnormal   Collection Time: 05/22/14  3:47 AM  Result Value Ref Range   WBC 4.9 4.0 - 10.5 K/uL   RBC 2.93 (L)  4.22 - 5.81 MIL/uL   Hemoglobin 8.6 (L) 13.0 - 17.0 g/dL   HCT 25.7 (L) 39.0 - 52.0 %   MCV 87.7 78.0 - 100.0 fL   MCH 29.4 26.0 - 34.0 pg   MCHC 33.5 30.0 - 36.0 g/dL   RDW 16.0 (H) 11.5 - 15.5 %   Platelets 112 (L) 150 - 400 K/uL    Comment: CONSISTENT WITH PREVIOUS RESULT  Lipase, blood     Status: None  Collection Time: 05/22/14  3:47 AM  Result Value Ref Range   Lipase 13 11 - 59 U/L  Hepatic function panel     Status: Abnormal   Collection Time: 05/22/14  3:47 AM  Result Value Ref Range   Total Protein 3.9 (L) 6.0 - 8.3 g/dL   Albumin 1.1 (L) 3.5 - 5.2 g/dL   AST 51 (H) 0 - 37 U/L   ALT 35 0 - 53 U/L   Alkaline Phosphatase 143 (H) 39 - 117 U/L   Total Bilirubin 1.2 0.3 - 1.2 mg/dL   Bilirubin, Direct 0.6 (H) 0.0 - 0.5 mg/dL   Indirect Bilirubin 0.6 0.3 - 0.9 mg/dL  Clostridium Difficile by PCR     Status: None   Collection Time: 05/22/14 12:33 PM  Result Value Ref Range   C difficile by pcr NEGATIVE NEGATIVE  Comprehensive metabolic panel     Status: Abnormal   Collection Time: 05/23/14  4:25 AM  Result Value Ref Range   Sodium 141 135 - 145 mmol/L   Potassium 3.5 3.5 - 5.1 mmol/L   Chloride 114 (H) 96 - 112 mmol/L   CO2 20 19 - 32 mmol/L   Glucose, Bld 81 70 - 99 mg/dL   BUN 11 6 - 23 mg/dL   Creatinine, Ser 1.93 (H) 0.50 - 1.35 mg/dL   Calcium 6.5 (L) 8.4 - 10.5 mg/dL   Total Protein 3.8 (L) 6.0 - 8.3 g/dL   Albumin 1.0 (L) 3.5 - 5.2 g/dL   AST 53 (H) 0 - 37 U/L   ALT 39 0 - 53 U/L   Alkaline Phosphatase 134 (H) 39 - 117 U/L   Total Bilirubin 1.4 (H) 0.3 - 1.2 mg/dL   GFR calc non Af Amer 34 (L) >90 mL/min   GFR calc Af Amer 40 (L) >90 mL/min    Comment: (NOTE) The eGFR has been calculated using the CKD EPI equation. This calculation has not been validated in all clinical situations. eGFR's persistently <90 mL/min signify possible Chronic Kidney Disease.    Anion gap 7 5 - 15  CBC     Status: Abnormal   Collection Time: 05/23/14  4:25 AM  Result Value  Ref Range   WBC 3.4 (L) 4.0 - 10.5 K/uL   RBC 2.69 (L) 4.22 - 5.81 MIL/uL   Hemoglobin 8.0 (L) 13.0 - 17.0 g/dL   HCT 23.3 (L) 39.0 - 52.0 %   MCV 86.6 78.0 - 100.0 fL   MCH 29.7 26.0 - 34.0 pg   MCHC 34.3 30.0 - 36.0 g/dL   RDW 16.1 (H) 11.5 - 15.5 %   Platelets 101 (L) 150 - 400 K/uL    Comment: CONSISTENT WITH PREVIOUS RESULT    Imaging / Studies: Ct Abdomen Pelvis Wo Contrast  05/21/2014   CLINICAL DATA:  Right lower extremity pain and swelling. Hypotension. Decreased hemoglobin.  EXAM: CT ABDOMEN AND PELVIS WITHOUT CONTRAST  TECHNIQUE: Multidetector CT imaging of the abdomen and pelvis was performed following the standard protocol without IV contrast.  COMPARISON:  CT abdomen and pelvis 05/12/2006.  FINDINGS: The patient has small bilateral pleural effusions. No pericardial effusion is noted. Calcific coronary artery disease is seen.  There is marked fatty infiltration of the liver diffusely. The gallbladder and right adrenal gland are unremarkable. The tail of the pancreas appears expanded and is contiguous with the upper pole the left kidney and splenic hilum. There may be a mass in the pancreatic tail. Additionally, anterior and lateral  to the neck of the pancreas, there is a low attenuating focus measuring 2.3 cm AP x 2.7 cm transverse on image 36 with Hounsfield unit of measurements of 10.9. A low attenuating left adrenal nodule consistent with an adenoma is unchanged. A hyper attenuating lesion the midpole left kidney measures 1.4 cm in diameter and likely represents a complex cyst but cannot be definitively characterized.  Along the descending duodenum, the patient appears to have a duodenum diverticulum. The small bowel is otherwise unremarkable. The walls of the colon are diffusely thickened. While this could be secondary to colitis, this may be related to the patient's hepatic insufficiency as changes are most notable in the ascending and proximal transverse colon. A small volume of  upper abdominal ascites is identified. Extensive aortoiliac atherosclerosis is noted.  In the left buttock, there is partial visualization of a lesion measuring 4.4 cm transverse by 3.2 cm AP on image 94. Diffuse body wall edema is noted. No lytic or sclerotic bony lesion is seen.  IMPRESSION: Diffuse and marked fatty infiltration of the liver. Small volume of perisplenic ascites is likely related to hepatic insufficiency.  Expanded tail of the pancreas which is contiguous with the left kidney and splenic hilum is not included on prior studies. Mass in the pancreatic tail is possible. A low attenuating lesion off the neck of the pancreas anterior and laterally may be a pancreatic pseudocyst but cannot be definitively characterized. MRI of the abdomen with contrast is recommended. MRI should be deferred until the patient is able to follow breath holding instructions. This could be performed as an outpatient.  Wall thickening of the colon most notable in the ascending and transverse is likely related to hepatic insufficiency although colitis could create a similar appearance.  Low attenuating lesion in the left buttock is worrisome for abscess.  Anasarca.  Extensive atherosclerosis.   Electronically Signed   By: Inge Rise M.D.   On: 05/21/2014 19:25   Dg Chest 1 View  05/21/2014   CLINICAL DATA:  Bilateral leg swelling and shortness of breath.  EXAM: CHEST  1 VIEW  COMPARISON:  02/07/2014  FINDINGS: Artifact overlies chest. The patient has taken a poor inspiration. Heart size is at the upper limits of normal. Mediastinal shadows are normal. There is probably pulmonary venous hypertension but there is no frank edema. No effusions. No collapse or infiltrate.  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  No frank edema.   Electronically Signed   By: Nelson Chimes M.D.   On: 05/21/2014 15:29   Dg Pelvis 1-2 Views  05/22/2014   CLINICAL DATA:  Right buttock abscess.  EXAM: PELVIS - 1-2 VIEW  COMPARISON:   05/21/2014  FINDINGS: Oral contrast material demonstrated within the descending and sigmoid colon. Lower lumbar spine degenerative change. Osseous pelvis is unremarkable. Vascular calcifications.  IMPRESSION: Lumbar spine degenerative changes.  Osseous skeleton are unremarkable.   Electronically Signed   By: Lovey Newcomer M.D.   On: 05/22/2014 10:23   Mr Hip Right Wo Contrast  05/22/2014   CLINICAL DATA:  Right hip pain. History of perianal abscess, alcoholic cirrhosis and renal failure. Evaluate for avascular necrosis. Initial encounter.  EXAM: MR OF THE RIGHT HIP WITHOUT CONTRAST  TECHNIQUE: Multiplanar, multisequence MR imaging was performed. No intravenous contrast was administered.  COMPARISON:  Pelvic radiographs 05/2014.  Pelvic CT 05/21/2014.  FINDINGS: Study is mildly motion degraded. Image quality is also degraded by diffuse soft tissue edema.  Bones: Both femoral heads appear normal without evidence  of acute fracture, dislocation or avascular necrosis. No focal or acute abnormalities are identified within the bony pelvis. The bone marrow is diffusely heterogeneous with mottled T1 hypo intensities, most pronounced within the lumbar spine. There is no suspicious marrow lesion on inversion recovery imaging, and this is probably due to osteoporosis. There is no evidence of sacroiliitis.  Articular cartilage and labrum  Articular cartilage: No focal chondral defect or subchondral signal abnormality identified.  Labrum: There is no gross labral tear or paralabral abnormality.  Joint or bursal effusion  Joint effusion: Small bilateral hip joint effusions.  Bursae: No focal periarticular fluid collections identified. However, there is extensive subcutaneous edema throughout the pelvis and proximal thighs. There is also mild muscular edema bilaterally.  Muscles and tendons  Muscles and tendons: The gluteus, hamstring and iliopsoas tendons appear intact. The piriformis muscles are symmetric. As above, there is mild  diffuse muscular edema throughout the pelvis.  Other findings  Miscellaneous: The previously demonstrated left perianal subcutaneous fluid collection is incompletely visualized by the current examination, although grossly stable. There is mild pelvic ascites which appears new. 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  IMPRESSION: 1. No evidence of femoral head avascular necrosis, osteomyelitis or acute osseous findings. 2. Progressive anasarca with generalized soft tissue edema, pelvic ascites and small bilateral hip joint effusions. No new focal fluid collections identified. The left perianal fluid collection is grossly stable, although incompletely visualized.   Electronically Signed   By: Richardean Sale M.D.   On: 05/22/2014 14:19   Dg Chest Port 1 View  05/22/2014   CLINICAL DATA:  Shortness of breath.  EXAM: PORTABLE CHEST - 1 VIEW  COMPARISON:  Chest radiograph 05/21/2014  FINDINGS: Monitoring leads overlie the patient. Stable cardiac and mediastinal contours. No consolidative pulmonary opacities. No pleural effusion or pneumothorax. Regional skeleton is unremarkable.  IMPRESSION: No acute cardiopulmonary process.   Electronically Signed   By: Lovey Newcomer M.D.   On: 05/22/2014 09:57    Medications / Allergies:  Scheduled Meds: . clindamycin (CLEOCIN) IV  600 mg Intravenous K2I  . folic acid  1 mg Oral Daily  . heparin  5,000 Units Subcutaneous 3 times per day  . lidocaine-EPINEPHrine  20 mL Intradermal Once  . LORazepam  0-4 mg Intravenous Q6H   Followed by  . LORazepam  0-4 mg Intravenous Q12H  . multivitamin with minerals  1 tablet Oral Daily  . pantoprazole  40 mg Oral Daily  . piperacillin-tazobactam (ZOSYN)  IV  3.375 g Intravenous Q8H  . thiamine  100 mg Oral Daily   Or  . thiamine  100 mg Intravenous Daily  . vancomycin  1,000 mg Intravenous Q24H   Continuous Infusions: . sodium chloride 125 mL/hr at 05/22/14 2352   PRN Meds:.acetaminophen **OR**  acetaminophen, albuterol, guaiFENesin-dextromethorphan, LORazepam **OR** LORazepam, morphine injection, ondansetron **OR** ondansetron (ZOFRAN) IV, oxyCODONE  Antibiotics: Anti-infectives    Start     Dose/Rate Route Frequency Ordered Stop   05/22/14 1800  vancomycin (VANCOCIN) IVPB 1000 mg/200 mL premix     1,000 mg 200 mL/hr over 60 Minutes Intravenous Every 24 hours 05/21/14 1627     05/21/14 2300  piperacillin-tazobactam (ZOSYN) IVPB 3.375 g     3.375 g 12.5 mL/hr over 240 Minutes Intravenous Every 8 hours 05/21/14 1754     05/21/14 1800  clindamycin (CLEOCIN) IVPB 600 mg     600 mg 100 mL/hr over 30 Minutes Intravenous Every 8 hours 05/21/14 1754  05/21/14 1630  vancomycin (VANCOCIN) 1,500 mg in sodium chloride 0.9 % 500 mL IVPB     1,500 mg 250 mL/hr over 120 Minutes Intravenous  Once 05/21/14 1625 05/21/14 1912   05/21/14 1615  piperacillin-tazobactam (ZOSYN) IVPB 3.375 g     3.375 g 100 mL/hr over 30 Minutes Intravenous  Once 05/21/14 1610 05/21/14 1712        Assessment/Plan Left buttock abscess Will proceed with a bedside I&D today.  Risks of the procedure discussed including but not limited to infection, bleeding, scarring, need for further procedures.  The patient verbalizes understanding and wishes to proceed Obtain consent Hold 0600 dose of heparin Obtain I&D tray, lidocaine with epinephrine   Erby Pian, ANP-BC [facility] Pager [phone](7A-4:30P) For consults and floor pages call [phone](7A-4:30P)  05/23/2014 8:07 AM

## 2014-05-23 NOTE — Progress Notes (Signed)
Pts dressing from IND was soaked with blood from procedure. Dressing changed but packing left in there per what MD previously stated to do today.

## 2014-05-24 ENCOUNTER — Inpatient Hospital Stay (HOSPITAL_COMMUNITY): Payer: Medicare Other

## 2014-05-24 DIAGNOSIS — N390 Urinary tract infection, site not specified: Secondary | ICD-10-CM

## 2014-05-24 LAB — CBC
HCT: 23.3 % — ABNORMAL LOW (ref 39.0–52.0)
HEMOGLOBIN: 7.8 g/dL — AB (ref 13.0–17.0)
MCH: 29.3 pg (ref 26.0–34.0)
MCHC: 33.5 g/dL (ref 30.0–36.0)
MCV: 87.6 fL (ref 78.0–100.0)
PLATELETS: 113 10*3/uL — AB (ref 150–400)
RBC: 2.66 MIL/uL — ABNORMAL LOW (ref 4.22–5.81)
RDW: 16.1 % — AB (ref 11.5–15.5)
WBC: 3.5 10*3/uL — AB (ref 4.0–10.5)

## 2014-05-24 LAB — BASIC METABOLIC PANEL
Anion gap: 6 (ref 5–15)
BUN: 11 mg/dL (ref 6–23)
CO2: 19 mmol/L (ref 19–32)
Calcium: 6.3 mg/dL — CL (ref 8.4–10.5)
Chloride: 115 mmol/L — ABNORMAL HIGH (ref 96–112)
Creatinine, Ser: 2 mg/dL — ABNORMAL HIGH (ref 0.50–1.35)
GFR calc Af Amer: 38 mL/min — ABNORMAL LOW (ref >90)
GFR, EST NON AFRICAN AMERICAN: 33 mL/min — AB (ref >90)
Glucose, Bld: 94 mg/dL (ref 70–99)
Potassium: 3.4 mmol/L — ABNORMAL LOW (ref 3.5–5.1)
SODIUM: 140 mmol/L (ref 135–145)

## 2014-05-24 MED ORDER — LEVOFLOXACIN IN D5W 500 MG/100ML IV SOLN
500.0000 mg | INTRAVENOUS | Status: DC
  Administered 2014-05-24: 500 mg via INTRAVENOUS
  Filled 2014-05-24: qty 100

## 2014-05-24 MED ORDER — LORAZEPAM 2 MG/ML IJ SOLN
2.0000 mg | Freq: Once | INTRAMUSCULAR | Status: AC
  Administered 2014-05-24: 2 mg via INTRAVENOUS

## 2014-05-24 MED ORDER — HALOPERIDOL LACTATE 5 MG/ML IJ SOLN: 4.0000 mg | Freq: Once | INTRAMUSCULAR | Status: DC

## 2014-05-24 MED ORDER — DOXYCYCLINE HYCLATE 100 MG PO TABS
100.0000 mg | ORAL_TABLET | Freq: Two times a day (BID) | ORAL | Status: DC
  Administered 2014-05-24 – 2014-05-27 (×7): 100 mg via ORAL
  Filled 2014-05-24 (×9): qty 1

## 2014-05-24 MED ORDER — POTASSIUM CHLORIDE CRYS ER 20 MEQ PO TBCR
40.0000 meq | EXTENDED_RELEASE_TABLET | ORAL | Status: AC
  Administered 2014-05-24 (×2): 40 meq via ORAL
  Filled 2014-05-24 (×2): qty 2

## 2014-05-24 NOTE — Progress Notes (Signed)
Patient ID: Justin Gilmore, male   DOB: 10-31-1945, 69 y.o.   MRN: [id number]     [facility]      [address] [place], [place] [address]    Phone: [phone] FAX: [phone]     Subjective: Afebrile.  No white count.    Objective:  Vital signs:  Filed Vitals:   05/23/14 2100 05/24/14 0036 05/24/14 0419 05/24/14 0836  BP:    143/64  Pulse:    86  Temp: 98.6 F (37 C) 98.3 F (36.8 C) 98.3 F (36.8 C) 97.6 F (36.4 C)  TempSrc: Oral Oral Oral Oral  Resp:    23  Height:      Weight:      SpO2:        Last BM Date: 05/21/14  Intake/Output   Yesterday:  03/09 0701 - 03/10 0700 In: 450 [I.V.:450] Out: 650 [Urine:650] This shift:     Physical Exam: Skin: left gluteal wound-clean, no residual pockets.    Problem List:   Principal Problem:   Sepsis Active Problems:   Hypertension   GERD (gastroesophageal reflux disease)   ETOH abuse   Atherosclerosis of native arteries of extremity with intermittent claudication   Lower extremity edema   Perianal abscess    Results:   Labs: Results for orders placed or performed during the hospital encounter of 05/21/14 (from the past 48 hour(s))  Clostridium Difficile by PCR     Status: None   Collection Time: 05/22/14 12:33 PM  Result Value Ref Range   C difficile by pcr NEGATIVE NEGATIVE  Comprehensive metabolic panel     Status: Abnormal   Collection Time: 05/23/14  4:25 AM  Result Value Ref Range   Sodium 141 135 - 145 mmol/L   Potassium 3.5 3.5 - 5.1 mmol/L   Chloride 114 (H) 96 - 112 mmol/L   CO2 20 19 - 32 mmol/L   Glucose, Bld 81 70 - 99 mg/dL   BUN 11 6 - 23 mg/dL   Creatinine, Ser 1.93 (H) 0.50 - 1.35 mg/dL   Calcium 6.5 (L) 8.4 - 10.5 mg/dL   Total Protein 3.8 (L) 6.0 - 8.3 g/dL   Albumin 1.0 (L) 3.5 - 5.2 g/dL   AST 53 (H) 0 - 37 U/L   ALT 39 0 - 53 U/L   Alkaline Phosphatase 134 (H) 39 - 117 U/L   Total Bilirubin 1.4 (H) 0.3 - 1.2 mg/dL   GFR  calc non Af Amer 34 (L) >90 mL/min   GFR calc Af Amer 40 (L) >90 mL/min    Comment: (NOTE) The eGFR has been calculated using the CKD EPI equation. This calculation has not been validated in all clinical situations. eGFR's persistently <90 mL/min signify possible Chronic Kidney Disease.    Anion gap 7 5 - 15  CBC     Status: Abnormal   Collection Time: 05/23/14  4:25 AM  Result Value Ref Range   WBC 3.4 (L) 4.0 - 10.5 K/uL   RBC 2.69 (L) 4.22 - 5.81 MIL/uL   Hemoglobin 8.0 (L) 13.0 - 17.0 g/dL   HCT 23.3 (L) 39.0 - 52.0 %   MCV 86.6 78.0 - 100.0 fL   MCH 29.7 26.0 - 34.0 pg   MCHC 34.3 30.0 - 36.0 g/dL   RDW 16.1 (H) 11.5 - 15.5 %   Platelets 101 (L) 150 - 400 K/uL    Comment: CONSISTENT WITH PREVIOUS RESULT  Protein,  pleural or peritoneal fluid     Status: None   Collection Time: 05/23/14  4:40 PM  Result Value Ref Range   Total protein, fluid <3.0 g/dL    Comment: (NOTE) No normal range established for this test Results should be evaluated in conjunction with serum values    Fluid Type-FTP ASCITIC   Body fluid cell count with differential     Status: None   Collection Time: 05/23/14  4:40 PM  Result Value Ref Range   Fluid Type-FCT ASCITIC     Comment: ABDOMEN   Color, Fluid YELLOW YELLOW   Appearance, Fluid CLEAR CLEAR   WBC, Fluid 18 0 - 1000 cu mm   Neutrophil Count, Fluid 10 0 - 25 %   Lymphs, Fluid 18 %   Monocyte-Macrophage-Serous Fluid 72 50 - 90 %   Eos, Fluid 0 %   Other Cells, Fluid 0 %  Body fluid culture     Status: None (Preliminary result)   Collection Time: 05/23/14  4:40 PM  Result Value Ref Range   Specimen Description ASCITIC ABDOMEN    Special Requests NONE    Gram Stain      RARE WBC PRESENT, PREDOMINANTLY PMN NO ORGANISMS SEEN Performed at [facility]    Culture NO GROWTH Performed at [facility]     Report Status PENDING   CBC     Status: Abnormal   Collection Time: 05/24/14  5:19 AM  Result Value Ref Range   WBC  3.5 (L) 4.0 - 10.5 K/uL   RBC 2.66 (L) 4.22 - 5.81 MIL/uL   Hemoglobin 7.8 (L) 13.0 - 17.0 g/dL    Comment: REPEATED TO VERIFY DELTA CHECK NOTED    HCT 23.3 (L) 39.0 - 52.0 %   MCV 87.6 78.0 - 100.0 fL   MCH 29.3 26.0 - 34.0 pg   MCHC 33.5 30.0 - 36.0 g/dL   RDW 16.1 (H) 11.5 - 15.5 %   Platelets 113 (L) 150 - 400 K/uL    Comment: REPEATED TO VERIFY CONSISTENT WITH PREVIOUS RESULT   Basic metabolic panel     Status: Abnormal   Collection Time: 05/24/14  5:19 AM  Result Value Ref Range   Sodium 140 135 - 145 mmol/L   Potassium 3.4 (L) 3.5 - 5.1 mmol/L   Chloride 115 (H) 96 - 112 mmol/L   CO2 19 19 - 32 mmol/L   Glucose, Bld 94 70 - 99 mg/dL   BUN 11 6 - 23 mg/dL   Creatinine, Ser 2.00 (H) 0.50 - 1.35 mg/dL   Calcium 6.3 (LL) 8.4 - 10.5 mg/dL    Comment: REPEATED TO VERIFY CRITICAL RESULT CALLED TO, READ BACK BY AND VERIFIED WITH: R.INGRAM,RN [id number] 05/24/14 Maina,S    GFR calc non Af Amer 33 (L) >90 mL/min   GFR calc Af Amer 38 (L) >90 mL/min    Comment: (NOTE) The eGFR has been calculated using the CKD EPI equation. This calculation has not been validated in all clinical situations. eGFR's persistently <90 mL/min signify possible Chronic Kidney Disease.    Anion gap 6 5 - 15    Imaging / Studies: Mr Hip Right Wo Contrast  05/22/2014   CLINICAL DATA:  Right hip pain. History of perianal abscess, alcoholic cirrhosis and renal failure. Evaluate for avascular necrosis. Initial encounter.  EXAM: MR OF THE RIGHT HIP WITHOUT CONTRAST  TECHNIQUE: Multiplanar, multisequence MR imaging was performed. No intravenous contrast was administered.  COMPARISON:  Pelvic radiographs 05/2014.  Pelvic CT 05/21/2014.  FINDINGS: Study is mildly motion degraded. Image quality is also degraded by diffuse soft tissue edema.  Bones: Both femoral heads appear normal without evidence of acute fracture, dislocation or avascular necrosis. No focal or acute abnormalities are identified within the bony pelvis.  The bone marrow is diffusely heterogeneous with mottled T1 hypo intensities, most pronounced within the lumbar spine. There is no suspicious marrow lesion on inversion recovery imaging, and this is probably due to osteoporosis. There is no evidence of sacroiliitis.  Articular cartilage and labrum  Articular cartilage: No focal chondral defect or subchondral signal abnormality identified.  Labrum: There is no gross labral tear or paralabral abnormality.  Joint or bursal effusion  Joint effusion: Small bilateral hip joint effusions.  Bursae: No focal periarticular fluid collections identified. However, there is extensive subcutaneous edema throughout the pelvis and proximal thighs. There is also mild muscular edema bilaterally.  Muscles and tendons  Muscles and tendons: The gluteus, hamstring and iliopsoas tendons appear intact. The piriformis muscles are symmetric. As above, there is mild diffuse muscular edema throughout the pelvis.  Other findings  Miscellaneous: The previously demonstrated left perianal subcutaneous fluid collection is incompletely visualized by the current examination, although grossly stable. There is mild pelvic ascites which appears new. 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  IMPRESSION: 1. No evidence of femoral head avascular necrosis, osteomyelitis or acute osseous findings. 2. Progressive anasarca with generalized soft tissue edema, pelvic ascites and small bilateral hip joint effusions. No new focal fluid collections identified. The left perianal fluid collection is grossly stable, although incompletely visualized.   Electronically Signed   By: Richardean Sale M.D.   On: 05/22/2014 14:19   US Paracentesis  05/24/2014   CLINICAL DATA:  Ascites  EXAM: ULTRASOUND GUIDED PARACENTESIS  TECHNIQUE: Survey ultrasound of the abdomen was performed and an appropriate skin entry site in the right lower abdomen was selected. Skin site was marked, prepped with Betadine,  and draped in usual sterile fashion, and infiltrated locally with 1% lidocaine. A 5 French multisidehole Yueh sheath needle was advanced into the peritoneal space until fluid could be aspirated. The sheath was advanced and the needle removed. 400 cc of clearascites were aspirated.  COMPLICATIONS: None  IMPRESSION: Technically successful ultrasound guided paracentesis, removing 400 cc of ascites.   Electronically Signed   By: Marybelle Killings M.D.   On: 05/24/2014 08:22    Medications / Allergies:  Scheduled Meds: . calcium-vitamin D  1 tablet Oral Q breakfast  . doxycycline  100 mg Oral Q12H  . folic acid  1 mg Oral Daily  . heparin  5,000 Units Subcutaneous 3 times per day  . LORazepam  0-4 mg Intravenous Q12H  . multivitamin with minerals  1 tablet Oral Daily  . pantoprazole  40 mg Oral Daily  . thiamine  100 mg Oral Daily   Or  . thiamine  100 mg Intravenous Daily   Continuous Infusions: . sodium chloride 50 mL/hr at 05/23/14 1853   PRN Meds:.acetaminophen **OR** acetaminophen, albuterol, guaiFENesin-dextromethorphan, LORazepam **OR** LORazepam, morphine injection, ondansetron **OR** ondansetron (ZOFRAN) IV, oxyCODONE  Antibiotics: Anti-infectives    Start     Dose/Rate Route Frequency Ordered Stop   05/24/14 1000  doxycycline (VIBRA-TABS) tablet 100 mg     100 mg Oral Every 12 hours 05/24/14 0756     05/23/14 1900  piperacillin-tazobactam (ZOSYN) IVPB 3.375 g  Status:  Discontinued     3.375 g 12.5 mL/hr over  240 Minutes Intravenous Every 8 hours 05/23/14 1850 05/24/14 0754   05/22/14 1800  vancomycin (VANCOCIN) IVPB 1000 mg/200 mL premix  Status:  Discontinued     1,000 mg 200 mL/hr over 60 Minutes Intravenous Every 24 hours 05/21/14 1627 05/24/14 0754   05/21/14 2300  piperacillin-tazobactam (ZOSYN) IVPB 3.375 g  Status:  Discontinued     3.375 g 12.5 mL/hr over 240 Minutes Intravenous Every 8 hours 05/21/14 1754 05/23/14 1850   05/21/14 1800  clindamycin (CLEOCIN) IVPB 600 mg   Status:  Discontinued     600 mg 100 mL/hr over 30 Minutes Intravenous Every 8 hours 05/21/14 1754 05/24/14 0756   05/21/14 1630  vancomycin (VANCOCIN) 1,500 mg in sodium chloride 0.9 % 500 mL IVPB     1,500 mg 250 mL/hr over 120 Minutes Intravenous  Once 05/21/14 1625 05/21/14 1912   05/21/14 1615  piperacillin-tazobactam (ZOSYN) IVPB 3.375 g     3.375 g 100 mL/hr over 30 Minutes Intravenous  Once 05/21/14 1610 05/21/14 1712        Assessment/Plan POD#1 incision and drainage for gluteal abscess -adequately draining, wound is clean and no residual pockets. -start BID wet to dry dressing changes -agree with changing to PO antibiotics   Erby Pian, ANP-BC [facility] Pager [phone](7A-4:30P) For consults and floor pages call [phone](7A-4:30P)  05/24/2014 11:12 AM

## 2014-05-24 NOTE — Progress Notes (Signed)
MRI attempted again this morning.  Pt given meds x2.  Pt still moving all over the place and asking to come out of scanner.  We were not able to obtain any images at this time.  Please let us know if pt able to come back and try at another time.

## 2014-05-24 NOTE — Progress Notes (Signed)
[facility] PROGRESS NOTE  Justin Gilmore YKD:[id number] DOB: 10/16/1945 DOA: 05/21/2014 PCP: Robert Bellow, MD  Assessment/Plan: Principal Problem:   Sepsis Active Problems:   Hypertension   GERD (gastroesophageal reflux disease)   ETOH abuse   Atherosclerosis of native arteries of extremity with intermittent claudication   Lower extremity edema   Perianal abscess   Severe Sepsis: Suspected secondary to left perianal abscess and UTI.  -General surgery helping with I & D -will narrow antibiotics to doxycycline and levaquin; continue supportive care -C. Difficile negative -no leukocytosis, no fever and with improve BP  Fatty infiltration of the liver/small pseudocyst vs mass in the tail of the pancreas/Ascites -Liver function normal -Lipase within normal limits -so far unsuccessful MRI abd/MRCP (patient unable to stay still during procedure) -Status post US paracentesis and follow fluid cytology and results  . Acute on chronic renal failure stage III: Suspect acute renal failure likely prerenal azotemia -Cr trending down after IVF's -will minimize use of nephrotoxic agents -Pharmacy to help adjust any medications for renal function  . Lower extremity edema: Has significant hypoalbuminemia-suspect this may be from underlying undiagnosed cirrhosis given significant history of alcohol abuse.  -ABI with moderate arterial disease -holding diuretics given renal failure -Venous Doppler negative -2-D echo: With preserved ejection fraction, no wall motion abnormality; normal cavity size and no hypertrophy appreciated.    . Perianal abscess:  -s/p I & D by [facility] -continue antibiotics, but will transition to by mouth regimen -Continue wound care and supportive care   Gram-negative rods UTI -Waiting sensitivity -antibiotics narrow to levaquin  . Anemia:  -combination of mild iron deficiency with AOCD and chronic alcohol use -once infection controlled will start iron  supplementation -patient will also need B supplements -no transfusion need currently, but will follow Hgb trend -stool guaiac negative  . Thrombocytopenia: Perhaps secondary to presumed liver cirrhosis and from heavy alcohol drinking -no signs of acute bleeding -follow platelets trend  . GERD (gastroesophageal reflux disease): Continue PPI   . Atherosclerosis of native arteries of extremity with intermittent claudication: Has had prior bypass surgery in his right lower extremities.  -Both lower extremities are significantly swollen, but appear warm to touch -ABI demonstrating moderate arterial disease; will need outpatient follow up with vascular surgery -advised to quit smoking  . Hypertension: BP is stable but soft -continue heart healthy diet -continue holding antihypertensive agents  . ETOH abuse: cessation counseling provided, Ativan per [facility] protocol  . Severe Malnutrition: Consult dietitian -will follow rec's and start nutrition supplements    Code Status: full Family Communication: family updated about patient's clinical progress Disposition Plan:  Will transfer out of stepdown; med-surg bed   Brief narrative: Justin Gilmore is a 69 y.o. male with a Past Medical History of peripheral vascular disease status post right leg bypass in 2012, ongoing alcohol abuse, hypertension, prior history of DVT not on Coumadin who presents today with the above noted complaint. Patient claims that for the past week or so he has been much more weaker than usual, claims that his bilateral lower extremities have had significant swelling than his usual baseline. He also claims that he thinks that his right leg is not just holding up right, and has had problems ambulating with that particular leg. Per family/patient patient is very minimally ambulatory and only/barely walks around the house. He also complains that he has noticed a rash in his bilateral lower extremities. Denies any pain. He denies  any fever-but patient's girlfriend does claim to  the patient's been having chills. Patient does not complain of shortness of breath at rest, however his family has noticed some mild dyspnea on exertion. Patient does claim that he has PND and gets up multiple times at night because of shortness of breath. He however denies orthopnea. No history of nausea or vomiting. He claims he had 2 loose stools yesterday. No chest pain. He continues to drink around 2-312 ounce cans of beer on a daily basis along with a shot of liquor. He continues to smoke  Consultants:  General surgery  IR  Procedures:  2-D echo: - Left ventricle: The cavity size was normal. Systolic function was normal. The estimated ejection fraction was in the range of 50% to 55%. Wall motion was normal; there were no regional wall motion abnormalities. - Mitral valve: There was mild regurgitation.   ABI: Demonstrated Moderate arterial disease bilaterally  Antibiotics: Clindamycin, Zosyn, vancomycin started 3/7>>>3/10 levaquin 3/10 Doxycycline 3/10   HPI/Subjective: Patient afebrile, lethargic after receiving Ativan for abdominal MRI attempt; no major complaints reports pain in his left buttock is much improved. .   Objective: Filed Vitals:   05/24/14 0036 05/24/14 0419 05/24/14 0836 05/24/14 1259  BP:   143/64 128/58  Pulse:   86 84  Temp: 98.3 F (36.8 C) 98.3 F (36.8 C) 97.6 F (36.4 C) 98.6 F (37 C)  TempSrc: Oral Oral Oral Oral  Resp:   23 36  Height:      Weight:      SpO2:    93%    Intake/Output Summary (Last 24 hours) at 05/24/14 1537 Last data filed at 05/24/14 0300  Gross per 24 hour  Intake    450 ml  Output      0 ml  Net    450 ml    Exam:  General: No acute respiratory disstres; no fever. Lethargic after receiving ativan for MRI of abdomen. Lungs: positive rhonchi, no wheezing; good air movement Cardiovascular: Regular rate and rhythm without murmur gallop or rub normal S1 and  S2 Abdomen: Nontender, mild distension on exam, decrease positive wave signs. Positive bowel sounds positive Extremities: No cyanosis or clubbing; positive edema (2++ bilaterally) Skin: left buttock wound with dressing in place; patient reports less pain   Data Reviewed: Basic Metabolic Panel:  Recent Labs Lab 05/21/14 1345 05/22/14 0347 05/23/14 0425 05/24/14 0519  NA 138 140 141 140  K 4.9 4.5 3.5 3.4*  CL 109 116* 114* 115*  CO2 18* 21 20 19   GLUCOSE 132* 87 81 94  BUN 17 16 11 11   CREATININE 2.17* 2.00* 1.93* 2.00*  CALCIUM 6.6* 6.5* 6.5* 6.3*    Liver Function Tests:  Recent Labs Lab 05/21/14 1345 05/22/14 0347 05/23/14 0425  AST 43* 51* 53*  ALT 38 35 39  ALKPHOS 150* 143* 134*  BILITOT 1.2 1.2 1.4*  PROT 4.0* 3.9* 3.8*  ALBUMIN 1.2* 1.1* 1.0*    Recent Labs Lab 05/22/14 0347  LIPASE 13   CBC:  Recent Labs Lab 05/21/14 1345 05/22/14 0347 05/23/14 0425 05/24/14 0519  WBC 5.7 4.9 3.4* 3.5*  NEUTROABS 4.4  --   --   --   HGB 8.9* 8.6* 8.0* 7.8*  HCT 26.9* 25.7* 23.3* 23.3*  MCV 90.9 87.7 86.6 87.6  PLT 116* 112* 101* 113*   BNP (last 3 results)  Recent Labs  05/21/14 1759  BNP 701.4*   CBG: No results for input(s): GLUCAP in the last 168 hours.  Recent Results (from the  past 240 hour(s))  Blood culture (routine x 2)     Status: None (Preliminary result)   Collection Time: 05/21/14  4:18 PM  Result Value Ref Range Status   Specimen Description BLOOD HAND LEFT  Final   Special Requests BOTTLES DRAWN AEROBIC AND ANAEROBIC 5CC  Final   Culture   Final           BLOOD CULTURE RECEIVED NO GROWTH TO DATE CULTURE WILL BE HELD FOR 5 DAYS BEFORE ISSUING A FINAL NEGATIVE REPORT Performed at [facility]    Report Status PENDING  Incomplete  Blood culture (routine x 2)     Status: None (Preliminary result)   Collection Time: 05/21/14  4:27 PM  Result Value Ref Range Status   Specimen Description BLOOD HAND RIGHT  Final   Special  Requests BOTTLES DRAWN AEROBIC AND ANAEROBIC 3CC  Final   Culture   Final           BLOOD CULTURE RECEIVED NO GROWTH TO DATE CULTURE WILL BE HELD FOR 5 DAYS BEFORE ISSUING A FINAL NEGATIVE REPORT Performed at [facility]    Report Status PENDING  Incomplete  Urine culture     Status: None (Preliminary result)   Collection Time: 05/21/14  5:09 PM  Result Value Ref Range Status   Specimen Description URINE, CLEAN CATCH  Final   Special Requests NONE  Final   Colony Count   Final    >=100,000 COLONIES/ML Performed at [facility]    Culture   Final    [place] Performed at [facility]    Report Status PENDING  Incomplete  MRSA PCR Screening     Status: None   Collection Time: 05/21/14  9:43 PM  Result Value Ref Range Status   MRSA by PCR NEGATIVE NEGATIVE Final    Comment:        The GeneXpert MRSA Assay (FDA approved for NASAL specimens only), is one component of a comprehensive MRSA colonization surveillance program. It is not intended to diagnose MRSA infection nor to guide or monitor treatment for MRSA infections.   Clostridium Difficile by PCR     Status: None   Collection Time: 05/22/14 12:33 PM  Result Value Ref Range Status   C difficile by pcr NEGATIVE NEGATIVE Final  Body fluid culture     Status: None (Preliminary result)   Collection Time: 05/23/14  4:40 PM  Result Value Ref Range Status   Specimen Description ASCITIC ABDOMEN  Final   Special Requests NONE  Final   Gram Stain   Final    RARE WBC PRESENT, PREDOMINANTLY PMN NO ORGANISMS SEEN Performed at [facility]    Culture NO GROWTH Performed at [facility]   Final   Report Status PENDING  Incomplete     Studies: Ct Abdomen Pelvis Wo Contrast  05/21/2014   CLINICAL DATA:  Right lower extremity pain and swelling. Hypotension. Decreased hemoglobin.  EXAM: CT ABDOMEN AND PELVIS WITHOUT CONTRAST  TECHNIQUE: Multidetector CT imaging of the abdomen and  pelvis was performed following the standard protocol without IV contrast.  COMPARISON:  CT abdomen and pelvis 05/12/2006.  FINDINGS: The patient has small bilateral pleural effusions. No pericardial effusion is noted. Calcific coronary artery disease is seen.  There is marked fatty infiltration of the liver diffusely. The gallbladder and right adrenal gland are unremarkable. The tail of the pancreas appears expanded and is contiguous with the upper pole the left kidney  and splenic hilum. There may be a mass in the pancreatic tail. Additionally, anterior and lateral to the neck of the pancreas, there is a low attenuating focus measuring 2.3 cm AP x 2.7 cm transverse on image 36 with Hounsfield unit of measurements of 10.9. A low attenuating left adrenal nodule consistent with an adenoma is unchanged. A hyper attenuating lesion the midpole left kidney measures 1.4 cm in diameter and likely represents a complex cyst but cannot be definitively characterized.  Along the descending duodenum, the patient appears to have a duodenum diverticulum. The small bowel is otherwise unremarkable. The walls of the colon are diffusely thickened. While this could be secondary to colitis, this may be related to the patient's hepatic insufficiency as changes are most notable in the ascending and proximal transverse colon. A small volume of upper abdominal ascites is identified. Extensive aortoiliac atherosclerosis is noted.  In the left buttock, there is partial visualization of a lesion measuring 4.4 cm transverse by 3.2 cm AP on image 94. Diffuse body wall edema is noted. No lytic or sclerotic bony lesion is seen.  IMPRESSION: Diffuse and marked fatty infiltration of the liver. Small volume of perisplenic ascites is likely related to hepatic insufficiency.  Expanded tail of the pancreas which is contiguous with the left kidney and splenic hilum is not included on prior studies. Mass in the pancreatic tail is possible. A low attenuating  lesion off the neck of the pancreas anterior and laterally may be a pancreatic pseudocyst but cannot be definitively characterized. MRI of the abdomen with contrast is recommended. MRI should be deferred until the patient is able to follow breath holding instructions. This could be performed as an outpatient.  Wall thickening of the colon most notable in the ascending and transverse is likely related to hepatic insufficiency although colitis could create a similar appearance.  Low attenuating lesion in the left buttock is worrisome for abscess.  Anasarca.  Extensive atherosclerosis.   Electronically Signed   By: Inge Rise M.D.   On: 05/21/2014 19:25   Dg Chest 1 View  05/21/2014   CLINICAL DATA:  Bilateral leg swelling and shortness of breath.  EXAM: CHEST  1 VIEW  COMPARISON:  02/07/2014  FINDINGS: Artifact overlies chest. The patient has taken a poor inspiration. Heart size is at the upper limits of normal. Mediastinal shadows are normal. There is probably pulmonary venous hypertension but there is no frank edema. No effusions. No collapse or infiltrate.  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  No frank edema.   Electronically Signed   By: Nelson Chimes M.D.   On: 05/21/2014 15:29   Dg Pelvis 1-2 Views  05/22/2014   CLINICAL DATA:  Right buttock abscess.  EXAM: PELVIS - 1-2 VIEW  COMPARISON:  05/21/2014  FINDINGS: Oral contrast material demonstrated within the descending and sigmoid colon. Lower lumbar spine degenerative change. Osseous pelvis is unremarkable. Vascular calcifications.  IMPRESSION: Lumbar spine degenerative changes.  Osseous skeleton are unremarkable.   Electronically Signed   By: Lovey Newcomer M.D.   On: 05/22/2014 10:23   Mr Hip Right Wo Contrast  05/22/2014   CLINICAL DATA:  Right hip pain. History of perianal abscess, alcoholic cirrhosis and renal failure. Evaluate for avascular necrosis. Initial encounter.  EXAM: MR OF THE RIGHT HIP WITHOUT CONTRAST  TECHNIQUE:  Multiplanar, multisequence MR imaging was performed. No intravenous contrast was administered.  COMPARISON:  Pelvic radiographs 05/2014.  Pelvic CT 05/21/2014.  FINDINGS: Study is mildly motion degraded. Image quality is  also degraded by diffuse soft tissue edema.  Bones: Both femoral heads appear normal without evidence of acute fracture, dislocation or avascular necrosis. No focal or acute abnormalities are identified within the bony pelvis. The bone marrow is diffusely heterogeneous with mottled T1 hypo intensities, most pronounced within the lumbar spine. There is no suspicious marrow lesion on inversion recovery imaging, and this is probably due to osteoporosis. There is no evidence of sacroiliitis.  Articular cartilage and labrum  Articular cartilage: No focal chondral defect or subchondral signal abnormality identified.  Labrum: There is no gross labral tear or paralabral abnormality.  Joint or bursal effusion  Joint effusion: Small bilateral hip joint effusions.  Bursae: No focal periarticular fluid collections identified. However, there is extensive subcutaneous edema throughout the pelvis and proximal thighs. There is also mild muscular edema bilaterally.  Muscles and tendons  Muscles and tendons: The gluteus, hamstring and iliopsoas tendons appear intact. The piriformis muscles are symmetric. As above, there is mild diffuse muscular edema throughout the pelvis.  Other findings  Miscellaneous: The previously demonstrated left perianal subcutaneous fluid collection is incompletely visualized by the current examination, although grossly stable. There is mild pelvic ascites which appears new. 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  IMPRESSION: 1. No evidence of femoral head avascular necrosis, osteomyelitis or acute osseous findings. 2. Progressive anasarca with generalized soft tissue edema, pelvic ascites and small bilateral hip joint effusions. No new focal fluid collections  identified. The left perianal fluid collection is grossly stable, although incompletely visualized.   Electronically Signed   By: Richardean Sale M.D.   On: 05/22/2014 14:19   US Paracentesis  05/24/2014   CLINICAL DATA:  Ascites  EXAM: ULTRASOUND GUIDED PARACENTESIS  TECHNIQUE: Survey ultrasound of the abdomen was performed and an appropriate skin entry site in the right lower abdomen was selected. Skin site was marked, prepped with Betadine, and draped in usual sterile fashion, and infiltrated locally with 1% lidocaine. A 5 French multisidehole Yueh sheath needle was advanced into the peritoneal space until fluid could be aspirated. The sheath was advanced and the needle removed. 400 cc of clearascites were aspirated.  COMPLICATIONS: None  IMPRESSION: Technically successful ultrasound guided paracentesis, removing 400 cc of ascites.   Electronically Signed   By: Marybelle Killings M.D.   On: 05/24/2014 08:22   Dg Chest Port 1 View  05/22/2014   CLINICAL DATA:  Shortness of breath.  EXAM: PORTABLE CHEST - 1 VIEW  COMPARISON:  Chest radiograph 05/21/2014  FINDINGS: Monitoring leads overlie the patient. Stable cardiac and mediastinal contours. No consolidative pulmonary opacities. No pleural effusion or pneumothorax. Regional skeleton is unremarkable.  IMPRESSION: No acute cardiopulmonary process.   Electronically Signed   By: Lovey Newcomer M.D.   On: 05/22/2014 09:57    Scheduled Meds: . calcium-vitamin D  1 tablet Oral Q breakfast  . doxycycline  100 mg Oral Q12H  . folic acid  1 mg Oral Daily  . heparin  5,000 Units Subcutaneous 3 times per day  . LORazepam  0-4 mg Intravenous Q12H  . multivitamin with minerals  1 tablet Oral Daily  . pantoprazole  40 mg Oral Daily  . thiamine  100 mg Oral Daily   Continuous Infusions:    Principal Problem:   Sepsis Active Problems:   Hypertension   GERD (gastroesophageal reflux disease)   ETOH abuse   Atherosclerosis of native arteries of extremity with  intermittent claudication   Lower extremity edema  Perianal abscess    Time spent: 35 minutes   Barton Dubois  [facility] Pager [phone]. If 7PM-7AM, please contact night-coverage at [email or web address], password [facility] 05/24/2014, 3:37 PM  LOS: 3 days

## 2014-05-24 NOTE — Progress Notes (Signed)
ANTIBIOTIC CONSULT NOTE - INITIAL  Pharmacy Consult for Levaquin Indication: UTI  No Known Allergies  Patient Measurements: Height: 5\' 6"  (167.6 cm) Weight: 173 lb 15.1 oz (78.9 kg) IBW/kg (Calculated) : 63.8 Adjusted Body Weight:    Vital Signs: Temp: 98.6 F (37 C) (03/10 1259) Temp Source: Oral (03/10 1259) BP: 128/58 mmHg (03/10 1259) Pulse Rate: 84 (03/10 1259) Intake/Output from previous day: 03/09 0701 - 03/10 0700 In: 450 [I.V.:450] Out: 650 [Urine:650] Intake/Output from this shift:    Labs:  Recent Labs  05/22/14 0347 05/23/14 0425 05/24/14 0519  WBC 4.9 3.4* 3.5*  HGB 8.6* 8.0* 7.8*  PLT 112* 101* 113*  CREATININE 2.00* 1.93* 2.00*   Estimated Creatinine Clearance: 35.4 mL/min (by C-G formula based on Cr of 2). No results for input(s): VANCOTROUGH, VANCOPEAK, VANCORANDOM, GENTTROUGH, GENTPEAK, GENTRANDOM, TOBRATROUGH, TOBRAPEAK, TOBRARND, AMIKACINPEAK, AMIKACINTROU, AMIKACIN in the last 72 hours.   Microbiology: Recent Results (from the past 720 hour(s))  Blood culture (routine x 2)     Status: None (Preliminary result)   Collection Time: 05/21/14  4:18 PM  Result Value Ref Range Status   Specimen Description BLOOD HAND LEFT  Final   Special Requests BOTTLES DRAWN AEROBIC AND ANAEROBIC 5CC  Final   Culture   Final           BLOOD CULTURE RECEIVED NO GROWTH TO DATE CULTURE WILL BE HELD FOR 5 DAYS BEFORE ISSUING A FINAL NEGATIVE REPORT Performed at [facility]    Report Status PENDING  Incomplete  Blood culture (routine x 2)     Status: None (Preliminary result)   Collection Time: 05/21/14  4:27 PM  Result Value Ref Range Status   Specimen Description BLOOD HAND RIGHT  Final   Special Requests BOTTLES DRAWN AEROBIC AND ANAEROBIC 3CC  Final   Culture   Final           BLOOD CULTURE RECEIVED NO GROWTH TO DATE CULTURE WILL BE HELD FOR 5 DAYS BEFORE ISSUING A FINAL NEGATIVE REPORT Performed at [facility]    Report Status PENDING   Incomplete  Urine culture     Status: None (Preliminary result)   Collection Time: 05/21/14  5:09 PM  Result Value Ref Range Status   Specimen Description URINE, CLEAN CATCH  Final   Special Requests NONE  Final   Colony Count   Final    >=100,000 COLONIES/ML Performed at [facility]    Culture   Final    [place] Performed at [facility]    Report Status PENDING  Incomplete  MRSA PCR Screening     Status: None   Collection Time: 05/21/14  9:43 PM  Result Value Ref Range Status   MRSA by PCR NEGATIVE NEGATIVE Final    Comment:        The GeneXpert MRSA Assay (FDA approved for NASAL specimens only), is one component of a comprehensive MRSA colonization surveillance program. It is not intended to diagnose MRSA infection nor to guide or monitor treatment for MRSA infections.   Clostridium Difficile by PCR     Status: None   Collection Time: 05/22/14 12:33 PM  Result Value Ref Range Status   C difficile by pcr NEGATIVE NEGATIVE Final  Body fluid culture     Status: None (Preliminary result)   Collection Time: 05/23/14  4:40 PM  Result Value Ref Range Status   Specimen Description ASCITIC ABDOMEN  Final   Special Requests NONE  Final  Gram Stain   Final    RARE WBC PRESENT, PREDOMINANTLY PMN NO ORGANISMS SEEN Performed at [facility]    Culture NO GROWTH Performed at [facility]   Final   Report Status PENDING  Incomplete    Medical History: Past Medical History  Diagnosis Date  . Hypertension   . Tobacco abuse   . DVT (deep venous thrombosis) 2012    Right lower ext.  Marland Kitchen GERD (gastroesophageal reflux disease)   . Renal cyst   . Renal failure     Stage 2- Stage 3  . DVT (deep venous thrombosis) 11/11/2010  . Arterial insufficiency 11/11/2010  . Renal insufficiency 11/11/2010  . Hypertension 11/11/2010  . GERD (gastroesophageal reflux disease) 11/11/2010  . 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 ETOH abuse 12/10/2010  .  Folic acid deficiency 7/89/3810  . Peripheral vascular disease   . Tubular adenoma of colon 2012  . H. pylori infection 2013    treated with prevpac    Medications:  Prescriptions prior to admission  Medication Sig Dispense Refill Last Dose  . amLODipine (NORVASC) 10 MG tablet Take 10 mg by mouth daily.     05/20/2014 at Unknown time  . aspirin 81 MG chewable tablet Chew 81 mg by mouth daily.   05/20/2014 at Unknown time  . B Complex-C (SUPER B COMPLEX PO) Take by mouth daily.   05/20/2014 at Unknown time  . chlorproMAZINE (THORAZINE) 10 MG tablet Take 10 mg by mouth 4 (four) times daily as needed for hiccoughs.    Past Month at Unknown time  . folic acid (FOLVITE) 175 MCG tablet Take 800 mcg by mouth daily.   05/20/2014 at Unknown time  . HYDROcodone-acetaminophen (NORCO/VICODIN) 5-325 MG per tablet Take 1 tablet by mouth every 6 (six) hours as needed for moderate pain.    Past Month at Unknown time  . loperamide (IMODIUM A-D) 2 MG tablet Take 2 mg by mouth 4 (four) times daily as needed for diarrhea or loose stools.   05/21/2014 at Unknown time  . metoprolol (LOPRESSOR) 50 MG tablet Take 50 mg by mouth 2 (two) times daily.     05/20/2014 at 735a  . pantoprazole (PROTONIX) 40 MG tablet TAKE ONE TABLET BY MOUTH EVERY DAY 30 tablet 11 05/20/2014 at Unknown time   Assessment: sepsis 69 y/o M presents with sepsis possibly due to L perianal abscess.   Anticoagulation: h/o DVT 1012, SQ heparin. Plts 113. Watch Hgb 7.8 declining.  Infectious Disease: : Afebrile. WBC 3.5. Doxycycline for abscess, Levaquin #3 for UTI with GNR.  3/10 Doxy>> 3/10 Levaquin>>  3/9: Abdomen Ascites>> 3/7: UCx: GNR 3/7: BC x 2>> 3/8 Cdiff: negative  Cardiovascular: HTN, PVD with h/o bypass surgery in his right lower extremities. LE with swelling and warmth. VSS  Endocrinology: glucose 94  Gastrointestinal / Nutrition: GERD, Fatty infiltration of the liver/small pseudocyst vs mass in the tail of the pancreas/Ascites. LFTs  WNL. Hypoalbumin (1)  likely from h/o alcohol abuse, PCM. , MV, thiamine, FA, OscalD  Neurology: h/o alcohol abuse. CIWA protocol  Nephrology: AoC renal failure. Scr 2, K 3.4 supplementing. Ca 6.3 adjusts to 8.7 with low albumin  Pulmonary: +tobacco  Hematology / Oncology: combination of mild iron deficiency with AOCD and chronic alcohol use. Iron when infection controlled. Thrombocytopenia possibly due to liver cirrhosis and heavy alcohol use.  PTA Medication Issues: metoprolol, Bcomlex, ASA, Norvasc  Best Practices  Goal of Therapy:  Eradication of infection  Plan:  Need Nicotine patch?  Levaquin 500mg  IV q48h. F/u cultures.   Sariyah Corcino S. Alford Highland, PharmD, BCPS Clinical Staff Pharmacist Pager [phone]  Eilene Ghazi Stillinger 05/24/2014,3:47 PM

## 2014-05-24 NOTE — Progress Notes (Signed)
Justin Gilmore is 69 y.o.male patient who transferred  from [facility]. Oriented x4, full code , VSS - Blood pressure 124/72, pulse 102, temperature 98.5 F (36.9 C), temperature source oral, resp. rate 22, weight 78.4 kg (173 lb), SpO2 94 %, No O2, no c/o shortness of breath, no  chest pain, no distress noted. Tele box #23.   IV site WDL: R forearm with a transparent dsg that's clean dry and intact.  Allergies:  No Known Allergies         Hypertension    Tobacco Use    DVT    Renal failure ( Stage 2-3)    ETOH abuse      SR up x 3,  Skin, clean-dry- intact without evidence of bruising, or skin tears.   Pt oriented to unit & room. Instructed on fall risk precautions. Bed alarm turned on. Wound on R buttocks. Dressing changed upon admission.    Will cont to monitor and assist as needed.  Ronne Binning, Justin Gilmore L  6:05 PM

## 2014-05-25 DIAGNOSIS — B965 Pseudomonas (aeruginosa) (mallei) (pseudomallei) as the cause of diseases classified elsewhere: Secondary | ICD-10-CM

## 2014-05-25 DIAGNOSIS — A4152 Sepsis due to Pseudomonas: Secondary | ICD-10-CM

## 2014-05-25 LAB — URINE CULTURE

## 2014-05-25 MED ORDER — FUROSEMIDE 20 MG PO TABS
10.0000 mg | ORAL_TABLET | Freq: Every day | ORAL | Status: DC
  Administered 2014-05-26 – 2014-05-27 (×2): 10 mg via ORAL
  Filled 2014-05-25 (×2): qty 0.5

## 2014-05-25 MED ORDER — LEVOFLOXACIN 500 MG PO TABS: 500.0000 mg | ORAL_TABLET | ORAL | Status: DC

## 2014-05-25 MED ORDER — CIPROFLOXACIN HCL 500 MG PO TABS
500.0000 mg | ORAL_TABLET | Freq: Two times a day (BID) | ORAL | Status: DC
  Administered 2014-05-25 – 2014-05-27 (×5): 500 mg via ORAL
  Filled 2014-05-25 (×7): qty 1

## 2014-05-25 MED ORDER — SPIRONOLACTONE 12.5 MG HALF TABLET
12.5000 mg | ORAL_TABLET | Freq: Every day | ORAL | Status: DC
  Administered 2014-05-26 – 2014-05-27 (×2): 12.5 mg via ORAL
  Filled 2014-05-25 (×2): qty 1

## 2014-05-25 MED ORDER — SACCHAROMYCES BOULARDII 250 MG PO CAPS
250.0000 mg | ORAL_CAPSULE | Freq: Two times a day (BID) | ORAL | Status: DC
  Administered 2014-05-25 – 2014-05-27 (×4): 250 mg via ORAL
  Filled 2014-05-25 (×5): qty 1

## 2014-05-25 NOTE — Progress Notes (Signed)
[facility] PROGRESS NOTE  Justin Gilmore BPZ:[id number] DOB: 02/24/1946 DOA: 05/21/2014 PCP: Robert Bellow, MD  Assessment/Plan: Principal Problem:   Sepsis Active Problems:   Hypertension   GERD (gastroesophageal reflux disease)   ETOH abuse   Atherosclerosis of native arteries of extremity with intermittent claudication   Lower extremity edema   Perianal abscess   Severe Sepsis: Suspected secondary to left perianal abscess and UTI.  -General surgery helping with I & D -will narrow antibiotics to doxycycline and ciprofloxacin; continue supportive care -C. Difficile negative -no leukocytosis, no fever and with improve BP  Fatty infiltration of the liver/small pseudocyst vs mass in the tail of the pancreas/Ascites -Liver function normal -Lipase within normal limits -so far unsuccessful MRI abd/MRCP (patient unable to stay still during procedure) -Status post US paracentesis; no abnormalities isolated on ascitic fluid -patient has not been able to stay still for MRI of abdomen; will need further follow up as an outpatient   . Acute on chronic renal failure stage III: Suspect acute renal failure likely prerenal azotemia -Cr trending down after IVF's -will minimize use of nephrotoxic agents -Pharmacy to help adjust any medications for renal function -since has remained stable will start low dose spironolactone and lasix in am  . Lower extremity edema: Has significant hypoalbuminemia-suspect this may be from underlying undiagnosed cirrhosis given significant history of alcohol abuse.  -ABI with moderate arterial disease -will start low dose diuretics combination in am (spironolactone and lasix) -Venous Doppler negative -2-D echo: With preserved ejection fraction, no wall motion abnormality; normal cavity size and no hypertrophy appreciated.    . Perianal abscess:  -s/p I & D by [facility] -continue antibiotics, but will transition to by mouth regimen -Continue wound care and  supportive care   Pseudomonas UTI -base on sensitivity will switch antibiotics to cipro BID -plan is to treat for a total of 7 days  . Anemia:  -combination of mild iron deficiency with AOCD and chronic alcohol use -once infection controlled will start iron supplementation -patient will also need B supplements -no transfusion need currently, but will follow Hgb trend -stool guaiac negative  . Thrombocytopenia: Perhaps secondary to presumed liver cirrhosis and from heavy alcohol drinking -no signs of acute bleeding -follow platelets trend  . GERD (gastroesophageal reflux disease): Continue PPI   . Atherosclerosis of native arteries of extremity with intermittent claudication: Has had prior bypass surgery in his right lower extremities.  -Both lower extremities are significantly swollen, but appear warm to touch -ABI demonstrating moderate arterial disease; will need outpatient follow up with vascular surgery -advised to quit smoking  . Hypertension: BP is stable but soft -continue heart healthy diet -continue holding antihypertensive agents for now  . ETOH abuse: cessation counseling provided,  -no withdrawal appreciated  . Severe Malnutrition: Consult dietitian -will follow rec's and start nutrition supplements    Diarrhea: from antibiotics most likely -will start florastor -C. Diff neg   Code Status: full Family Communication: family updated about patient's clinical progress Disposition Plan:  Organized outpatient care; assess level of functionality with PT; most likely home in 1-2 days   Brief narrative: Justin Gilmore is a 69 y.o. male with a Past Medical History of peripheral vascular disease status post right leg bypass in 2012, ongoing alcohol abuse, hypertension, prior history of DVT not on Coumadin who presents today with the above noted complaint. Patient claims that for the past week or so he has been much more weaker than usual, claims that his bilateral  lower extremities have had significant swelling than his usual baseline. He also claims that he thinks that his right leg is not just holding up right, and has had problems ambulating with that particular leg. Per family/patient patient is very minimally ambulatory and only/barely walks around the house. He also complains that he has noticed a rash in his bilateral lower extremities. Denies any pain. He denies any fever-but patient's girlfriend does claim to the patient's been having chills. Patient does not complain of shortness of breath at rest, however his family has noticed some mild dyspnea on exertion. Patient does claim that he has PND and gets up multiple times at night because of shortness of breath. He however denies orthopnea. No history of nausea or vomiting. He claims he had 2 loose stools yesterday. No chest pain. He continues to drink around 2-312 ounce cans of beer on a daily basis along with a shot of liquor. He continues to smoke  Consultants:  General surgery  IR  Procedures:  2-D echo: - Left ventricle: The cavity size was normal. Systolic function was normal. The estimated ejection fraction was in the range of 50% to 55%. Wall motion was normal; there were no regional wall motion abnormalities. - Mitral valve: There was mild regurgitation.   ABI: Demonstrated Moderate arterial disease bilaterally  Antibiotics: Clindamycin, Zosyn, vancomycin started 3/7>>>3/10 Ciprofloxacin 3/11 levaquin 3/10>>3/11 Doxycycline 3/10   HPI/Subjective: Patient afebrile, feeling better and currently w/o major complaints. Couple episode of loose stool, most like from antibiotics.   Objective: Filed Vitals:   05/24/14 2301 05/24/14 2303 05/25/14 0639 05/25/14 1449  BP: 109/57  125/61 107/69  Pulse: 99 98 94 98  Temp: 99.1 F (37.3 C)  98.3 F (36.8 C) 98.6 F (37 C)  TempSrc: Oral  Oral Oral  Resp: 18  24 16   Height:      Weight:      SpO2: 86% 95% 96% 93%     Intake/Output Summary (Last 24 hours) at 05/25/14 1834 Last data filed at 05/25/14 1740  Gross per 24 hour  Intake    580 ml  Output    300 ml  Net    280 ml    Exam:  General: No acute respiratory disstres; no fever. Feeling a lot better and denying much pain in his left buttock area. No dysuria Lungs: positive rhonchi, no wheezing; good air movement Cardiovascular: Regular rate and rhythm without murmur gallop or rub normal S1 and S2 Abdomen: Nontender, mild distension on exam, decrease positive wave signs. Positive bowel sounds positive Extremities: No cyanosis or clubbing; positive edema (2++ bilaterally) Skin: left buttock wound with dressing in place; patient reports less pain   Data Reviewed: Basic Metabolic Panel:  Recent Labs Lab 05/21/14 1345 05/22/14 0347 05/23/14 0425 05/24/14 0519  NA 138 140 141 140  K 4.9 4.5 3.5 3.4*  CL 109 116* 114* 115*  CO2 18* 21 20 19   GLUCOSE 132* 87 81 94  BUN 17 16 11 11   CREATININE 2.17* 2.00* 1.93* 2.00*  CALCIUM 6.6* 6.5* 6.5* 6.3*    Liver Function Tests:  Recent Labs Lab 05/21/14 1345 05/22/14 0347 05/23/14 0425  AST 43* 51* 53*  ALT 38 35 39  ALKPHOS 150* 143* 134*  BILITOT 1.2 1.2 1.4*  PROT 4.0* 3.9* 3.8*  ALBUMIN 1.2* 1.1* 1.0*    Recent Labs Lab 05/22/14 0347  LIPASE 13   CBC:  Recent Labs Lab 05/21/14 1345 05/22/14 0347 05/23/14 0425 05/24/14 0519  WBC  5.7 4.9 3.4* 3.5*  NEUTROABS 4.4  --   --   --   HGB 8.9* 8.6* 8.0* 7.8*  HCT 26.9* 25.7* 23.3* 23.3*  MCV 90.9 87.7 86.6 87.6  PLT 116* 112* 101* 113*   BNP (last 3 results)  Recent Labs  05/21/14 1759  BNP 701.4*    Recent Results (from the past 240 hour(s))  Blood culture (routine x 2)     Status: None (Preliminary result)   Collection Time: 05/21/14  4:18 PM  Result Value Ref Range Status   Specimen Description BLOOD HAND LEFT  Final   Special Requests BOTTLES DRAWN AEROBIC AND ANAEROBIC 5CC  Final   Culture   Final            BLOOD CULTURE RECEIVED NO GROWTH TO DATE CULTURE WILL BE HELD FOR 5 DAYS BEFORE ISSUING A FINAL NEGATIVE REPORT Performed at [facility]    Report Status PENDING  Incomplete  Blood culture (routine x 2)     Status: None (Preliminary result)   Collection Time: 05/21/14  4:27 PM  Result Value Ref Range Status   Specimen Description BLOOD HAND RIGHT  Final   Special Requests BOTTLES DRAWN AEROBIC AND ANAEROBIC 3CC  Final   Culture   Final           BLOOD CULTURE RECEIVED NO GROWTH TO DATE CULTURE WILL BE HELD FOR 5 DAYS BEFORE ISSUING A FINAL NEGATIVE REPORT Performed at [facility]    Report Status PENDING  Incomplete  Urine culture     Status: None   Collection Time: 05/21/14  5:09 PM  Result Value Ref Range Status   Specimen Description URINE, CLEAN CATCH  Final   Special Requests NONE  Final   Colony Count   Final    >=100,000 COLONIES/ML Performed at [facility]    Culture   Final    PSEUDOMONAS AERUGINOSA Performed at [facility]    Report Status 05/25/2014 FINAL  Final   Organism ID, Bacteria PSEUDOMONAS AERUGINOSA  Final      Susceptibility   Pseudomonas aeruginosa - MIC*    CEFEPIME 4 SENSITIVE Sensitive     CEFTAZIDIME 4 SENSITIVE Sensitive     CIPROFLOXACIN <=0.25 SENSITIVE Sensitive     GENTAMICIN 2 SENSITIVE Sensitive     IMIPENEM 2 SENSITIVE Sensitive     PIP/TAZO 16 SENSITIVE Sensitive     TOBRAMYCIN <=1 SENSITIVE Sensitive     * PSEUDOMONAS AERUGINOSA  MRSA PCR Screening     Status: None   Collection Time: 05/21/14  9:43 PM  Result Value Ref Range Status   MRSA by PCR NEGATIVE NEGATIVE Final    Comment:        The GeneXpert MRSA Assay (FDA approved for NASAL specimens only), is one component of a comprehensive MRSA colonization surveillance program. It is not intended to diagnose MRSA infection nor to guide or monitor treatment for MRSA infections.   Clostridium Difficile by PCR     Status: None   Collection  Time: 05/22/14 12:33 PM  Result Value Ref Range Status   C difficile by pcr NEGATIVE NEGATIVE Final  Body fluid culture     Status: None (Preliminary result)   Collection Time: 05/23/14  4:40 PM  Result Value Ref Range Status   Specimen Description ASCITIC ABDOMEN  Final   Special Requests NONE  Final   Gram Stain   Final    RARE WBC PRESENT, PREDOMINANTLY PMN NO ORGANISMS  SEEN Performed at [facility]   Final    NO GROWTH 1 DAY Performed at [facility]    Report Status PENDING  Incomplete     Studies: Ct Abdomen Pelvis Wo Contrast  05/21/2014   CLINICAL DATA:  Right lower extremity pain and swelling. Hypotension. Decreased hemoglobin.  EXAM: CT ABDOMEN AND PELVIS WITHOUT CONTRAST  TECHNIQUE: Multidetector CT imaging of the abdomen and pelvis was performed following the standard protocol without IV contrast.  COMPARISON:  CT abdomen and pelvis 05/12/2006.  FINDINGS: The patient has small bilateral pleural effusions. No pericardial effusion is noted. Calcific coronary artery disease is seen.  There is marked fatty infiltration of the liver diffusely. The gallbladder and right adrenal gland are unremarkable. The tail of the pancreas appears expanded and is contiguous with the upper pole the left kidney and splenic hilum. There may be a mass in the pancreatic tail. Additionally, anterior and lateral to the neck of the pancreas, there is a low attenuating focus measuring 2.3 cm AP x 2.7 cm transverse on image 36 with Hounsfield unit of measurements of 10.9. A low attenuating left adrenal nodule consistent with an adenoma is unchanged. A hyper attenuating lesion the midpole left kidney measures 1.4 cm in diameter and likely represents a complex cyst but cannot be definitively characterized.  Along the descending duodenum, the patient appears to have a duodenum diverticulum. The small bowel is otherwise unremarkable. The walls of the colon are diffusely thickened. While  this could be secondary to colitis, this may be related to the patient's hepatic insufficiency as changes are most notable in the ascending and proximal transverse colon. A small volume of upper abdominal ascites is identified. Extensive aortoiliac atherosclerosis is noted.  In the left buttock, there is partial visualization of a lesion measuring 4.4 cm transverse by 3.2 cm AP on image 94. Diffuse body wall edema is noted. No lytic or sclerotic bony lesion is seen.  IMPRESSION: Diffuse and marked fatty infiltration of the liver. Small volume of perisplenic ascites is likely related to hepatic insufficiency.  Expanded tail of the pancreas which is contiguous with the left kidney and splenic hilum is not included on prior studies. Mass in the pancreatic tail is possible. A low attenuating lesion off the neck of the pancreas anterior and laterally may be a pancreatic pseudocyst but cannot be definitively characterized. MRI of the abdomen with contrast is recommended. MRI should be deferred until the patient is able to follow breath holding instructions. This could be performed as an outpatient.  Wall thickening of the colon most notable in the ascending and transverse is likely related to hepatic insufficiency although colitis could create a similar appearance.  Low attenuating lesion in the left buttock is worrisome for abscess.  Anasarca.  Extensive atherosclerosis.   Electronically Signed   By: Inge Rise M.D.   On: 05/21/2014 19:25   Dg Chest 1 View  05/21/2014   CLINICAL DATA:  Bilateral leg swelling and shortness of breath.  EXAM: CHEST  1 VIEW  COMPARISON:  02/07/2014  FINDINGS: Artifact overlies chest. The patient has taken a poor inspiration. Heart size is at the upper limits of normal. Mediastinal shadows are normal. There is probably pulmonary venous hypertension but there is no frank edema. No effusions. No collapse or infiltrate.  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  No frank  edema.   Electronically Signed   By: Nelson Chimes M.D.   On: 05/21/2014 15:29  Dg Pelvis 1-2 Views  05/22/2014   CLINICAL DATA:  Right buttock abscess.  EXAM: PELVIS - 1-2 VIEW  COMPARISON:  05/21/2014  FINDINGS: Oral contrast material demonstrated within the descending and sigmoid colon. Lower lumbar spine degenerative change. Osseous pelvis is unremarkable. Vascular calcifications.  IMPRESSION: Lumbar spine degenerative changes.  Osseous skeleton are unremarkable.   Electronically Signed   By: Lovey Newcomer M.D.   On: 05/22/2014 10:23   Mr Hip Right Wo Contrast  05/22/2014   CLINICAL DATA:  Right hip pain. History of perianal abscess, alcoholic cirrhosis and renal failure. Evaluate for avascular necrosis. Initial encounter.  EXAM: MR OF THE RIGHT HIP WITHOUT CONTRAST  TECHNIQUE: Multiplanar, multisequence MR imaging was performed. No intravenous contrast was administered.  COMPARISON:  Pelvic radiographs 05/2014.  Pelvic CT 05/21/2014.  FINDINGS: Study is mildly motion degraded. Image quality is also degraded by diffuse soft tissue edema.  Bones: Both femoral heads appear normal without evidence of acute fracture, dislocation or avascular necrosis. No focal or acute abnormalities are identified within the bony pelvis. The bone marrow is diffusely heterogeneous with mottled T1 hypo intensities, most pronounced within the lumbar spine. There is no suspicious marrow lesion on inversion recovery imaging, and this is probably due to osteoporosis. There is no evidence of sacroiliitis.  Articular cartilage and labrum  Articular cartilage: No focal chondral defect or subchondral signal abnormality identified.  Labrum: There is no gross labral tear or paralabral abnormality.  Joint or bursal effusion  Joint effusion: Small bilateral hip joint effusions.  Bursae: No focal periarticular fluid collections identified. However, there is extensive subcutaneous edema throughout the pelvis and proximal thighs. There is also  mild muscular edema bilaterally.  Muscles and tendons  Muscles and tendons: The gluteus, hamstring and iliopsoas tendons appear intact. The piriformis muscles are symmetric. As above, there is mild diffuse muscular edema throughout the pelvis.  Other findings  Miscellaneous: The previously demonstrated left perianal subcutaneous fluid collection is incompletely visualized by the current examination, although grossly stable. There is mild pelvic ascites which appears new. 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  IMPRESSION: 1. No evidence of femoral head avascular necrosis, osteomyelitis or acute osseous findings. 2. Progressive anasarca with generalized soft tissue edema, pelvic ascites and small bilateral hip joint effusions. No new focal fluid collections identified. The left perianal fluid collection is grossly stable, although incompletely visualized.   Electronically Signed   By: Richardean Sale M.D.   On: 05/22/2014 14:19   US Paracentesis  05/24/2014   CLINICAL DATA:  Ascites  EXAM: ULTRASOUND GUIDED PARACENTESIS  TECHNIQUE: Survey ultrasound of the abdomen was performed and an appropriate skin entry site in the right lower abdomen was selected. Skin site was marked, prepped with Betadine, and draped in usual sterile fashion, and infiltrated locally with 1% lidocaine. A 5 French multisidehole Yueh sheath needle was advanced into the peritoneal space until fluid could be aspirated. The sheath was advanced and the needle removed. 400 cc of clearascites were aspirated.  COMPLICATIONS: None  IMPRESSION: Technically successful ultrasound guided paracentesis, removing 400 cc of ascites.   Electronically Signed   By: Marybelle Killings M.D.   On: 05/24/2014 08:22   Dg Chest Port 1 View  05/22/2014   CLINICAL DATA:  Shortness of breath.  EXAM: PORTABLE CHEST - 1 VIEW  COMPARISON:  Chest radiograph 05/21/2014  FINDINGS: Monitoring leads overlie the patient. Stable cardiac and mediastinal  contours. No consolidative pulmonary opacities. No pleural effusion or  pneumothorax. Regional skeleton is unremarkable.  IMPRESSION: No acute cardiopulmonary process.   Electronically Signed   By: Lovey Newcomer M.D.   On: 05/22/2014 09:57    Scheduled Meds: . calcium-vitamin D  1 tablet Oral Q breakfast  . ciprofloxacin  500 mg Oral BID  . doxycycline  100 mg Oral Q12H  . folic acid  1 mg Oral Daily  . [START ON 05/26/2014] furosemide  10 mg Oral Daily  . heparin  5,000 Units Subcutaneous 3 times per day  . LORazepam  0-4 mg Intravenous Q12H  . multivitamin with minerals  1 tablet Oral Daily  . pantoprazole  40 mg Oral Daily  . saccharomyces boulardii  250 mg Oral BID  . [START ON 05/26/2014] spironolactone  12.5 mg Oral Daily  . thiamine  100 mg Oral Daily   Continuous Infusions:    Principal Problem:   Sepsis Active Problems:   Hypertension   GERD (gastroesophageal reflux disease)   ETOH abuse   Atherosclerosis of native arteries of extremity with intermittent claudication   Lower extremity edema   Perianal abscess   Time spent: 30 minutes   Barton Dubois  [facility] Pager [phone]. If 7PM-7AM, please contact night-coverage at [email or web address], password [facility] 05/25/2014, 6:34 PM  LOS: 4 days

## 2014-05-25 NOTE — Progress Notes (Signed)
Patient ID: Justin Gilmore, male   DOB: [birth year], 69 y.o.   MRN: [id number]     [facility]      [address] [place], [place] [address]    Phone: [phone] FAX: [phone]     Subjective: Afebrile.  Less pain.   Objective:  Vital signs:  Filed Vitals:   05/24/14 2300 05/24/14 2301 05/24/14 2303 05/25/14 0639  BP: 105/62 109/57  125/61  Pulse:  99 98 94  Temp: 98.1 F (36.7 C) 99.1 F (37.3 C)  98.3 F (36.8 C)  TempSrc: Oral Oral  Oral  Resp: 18 18  24   Height:      Weight:      SpO2:  86% 95% 96%    Last BM Date: 05/24/14  Intake/Output   Yesterday:  03/10 0701 - 03/11 0700 In: 100 [IV Piggyback:100] Out: 200 [Urine:200] This shift:    I/O last 3 completed shifts: In: 500 [I.V.:400; IV Piggyback:100] Out: 200 [Urine:200]    Physical Exam: Skin: left gluteal wound-clean, no residual pockets.    Problem List:   Principal Problem:   Sepsis Active Problems:   Hypertension   GERD (gastroesophageal reflux disease)   ETOH abuse   Atherosclerosis of native arteries of extremity with intermittent claudication   Lower extremity edema   Perianal abscess    Results:   Labs: Results for orders placed or performed during the hospital encounter of 05/21/14 (from the past 48 hour(s))  Protein, pleural or peritoneal fluid     Status: None   Collection Time: 05/23/14  4:40 PM  Result Value Ref Range   Total protein, fluid <3.0 g/dL    Comment: (NOTE) No normal range established for this test Results should be evaluated in conjunction with serum values    Fluid Type-FTP ASCITIC   Body fluid cell count with differential     Status: None   Collection Time: 05/23/14  4:40 PM  Result Value Ref Range   Fluid Type-FCT ASCITIC     Comment: ABDOMEN   Color, Fluid YELLOW YELLOW   Appearance, Fluid CLEAR CLEAR   WBC, Fluid 18 0 - 1000 cu mm   Neutrophil Count, Fluid 10 0 - 25 %   Lymphs, Fluid 18 %   Monocyte-Macrophage-Serous Fluid 72 50 - 90 %   Eos, Fluid 0 %   Other Cells, Fluid 0 %  Body fluid culture     Status: None (Preliminary result)   Collection Time: 05/23/14  4:40 PM  Result Value Ref Range   Specimen Description ASCITIC ABDOMEN    Special Requests NONE    Gram Stain      RARE WBC PRESENT, PREDOMINANTLY PMN NO ORGANISMS SEEN Performed at [facility]    Culture NO GROWTH Performed at [facility]     Report Status PENDING   CBC     Status: Abnormal   Collection Time: 05/24/14  5:19 AM  Result Value Ref Range   WBC 3.5 (L) 4.0 - 10.5 K/uL   RBC 2.66 (L) 4.22 - 5.81 MIL/uL   Hemoglobin 7.8 (L) 13.0 - 17.0 g/dL    Comment: REPEATED TO VERIFY DELTA CHECK NOTED    HCT 23.3 (L) 39.0 - 52.0 %   MCV 87.6 78.0 - 100.0 fL   MCH 29.3 26.0 - 34.0 pg   MCHC 33.5 30.0 - 36.0 g/dL   RDW 16.1 (H) 11.5 - 15.5 %   Platelets 113 (L)  150 - 400 K/uL    Comment: REPEATED TO VERIFY CONSISTENT WITH PREVIOUS RESULT   Basic metabolic panel     Status: Abnormal   Collection Time: 05/24/14  5:19 AM  Result Value Ref Range   Sodium 140 135 - 145 mmol/L   Potassium 3.4 (L) 3.5 - 5.1 mmol/L   Chloride 115 (H) 96 - 112 mmol/L   CO2 19 19 - 32 mmol/L   Glucose, Bld 94 70 - 99 mg/dL   BUN 11 6 - 23 mg/dL   Creatinine, Ser 2.00 (H) 0.50 - 1.35 mg/dL   Calcium 6.3 (LL) 8.4 - 10.5 mg/dL    Comment: REPEATED TO VERIFY CRITICAL RESULT CALLED TO, READ BACK BY AND VERIFIED WITH: R.INGRAM,RN [id number] 05/24/14 [place],S    GFR calc non Af Amer 33 (L) >90 mL/min   GFR calc Af Amer 38 (L) >90 mL/min    Comment: (NOTE) The eGFR has been calculated using the CKD EPI equation. This calculation has not been validated in all clinical situations. eGFR's persistently <90 mL/min signify possible Chronic Kidney Disease.    Anion gap 6 5 - 15    Imaging / Studies: US Paracentesis  05/24/2014   CLINICAL DATA:  Ascites  EXAM: ULTRASOUND GUIDED PARACENTESIS  TECHNIQUE: Survey  ultrasound of the abdomen was performed and an appropriate skin entry site in the right lower abdomen was selected. Skin site was marked, prepped with Betadine, and draped in usual sterile fashion, and infiltrated locally with 1% lidocaine. A 5 French multisidehole Yueh sheath needle was advanced into the peritoneal space until fluid could be aspirated. The sheath was advanced and the needle removed. 400 cc of clearascites were aspirated.  COMPLICATIONS: None  IMPRESSION: Technically successful ultrasound guided paracentesis, removing 400 cc of ascites.   Electronically Signed   By: Marybelle Killings M.D.   On: 05/24/2014 08:22    Medications / Allergies:  Scheduled Meds: . calcium-vitamin D  1 tablet Oral Q breakfast  . doxycycline  100 mg Oral Q12H  . folic acid  1 mg Oral Daily  . heparin  5,000 Units Subcutaneous 3 times per day  . levofloxacin (LEVAQUIN) IV  500 mg Intravenous Q48H  . LORazepam  0-4 mg Intravenous Q12H  . multivitamin with minerals  1 tablet Oral Daily  . pantoprazole  40 mg Oral Daily  . thiamine  100 mg Oral Daily   Continuous Infusions:  PRN Meds:.acetaminophen **OR** acetaminophen, albuterol, guaiFENesin-dextromethorphan, morphine injection, ondansetron **OR** ondansetron (ZOFRAN) IV, oxyCODONE  Antibiotics: Anti-infectives    Start     Dose/Rate Route Frequency Ordered Stop   05/24/14 1630  levofloxacin (LEVAQUIN) IVPB 500 mg     500 mg 100 mL/hr over 60 Minutes Intravenous Every 48 hours 05/24/14 1552     05/24/14 1000  doxycycline (VIBRA-TABS) tablet 100 mg     100 mg Oral Every 12 hours 05/24/14 0756     05/23/14 1900  piperacillin-tazobactam (ZOSYN) IVPB 3.375 g  Status:  Discontinued     3.375 g 12.5 mL/hr over 240 Minutes Intravenous Every 8 hours 05/23/14 1850 05/24/14 0754   05/22/14 1800  vancomycin (VANCOCIN) IVPB 1000 mg/200 mL premix  Status:  Discontinued     1,000 mg 200 mL/hr over 60 Minutes Intravenous Every 24 hours 05/21/14 1627 05/24/14 0754    05/21/14 2300  piperacillin-tazobactam (ZOSYN) IVPB 3.375 g  Status:  Discontinued     3.375 g 12.5 mL/hr over 240 Minutes Intravenous Every 8 hours 05/21/14 1754 05/23/14  1850   05/21/14 1800  clindamycin (CLEOCIN) IVPB 600 mg  Status:  Discontinued     600 mg 100 mL/hr over 30 Minutes Intravenous Every 8 hours 05/21/14 1754 05/24/14 0756   05/21/14 1630  vancomycin (VANCOCIN) 1,500 mg in sodium chloride 0.9 % 500 mL IVPB     1,500 mg 250 mL/hr over 120 Minutes Intravenous  Once 05/21/14 1625 05/21/14 1912   05/21/14 1615  piperacillin-tazobactam (ZOSYN) IVPB 3.375 g     3.375 g 100 mL/hr over 30 Minutes Intravenous  Once 05/21/14 1610 05/21/14 1712       Assessment/Plan POD#2 incision and drainage for gluteal abscess -adequately draining, wound is clean and no residual pockets. -BID wet to dry dressing changes -we will follow up on Monday if the patient remains hospitalized.  Otherwise, I have scheduled a follow up for a wound check in our clinic.  He will need HH for BID wet to dry dressing changes.  Erby Pian, [facility] Pager [phone]) For consults and floor pages call [phone](7A-4:30P)  05/25/2014 9:43 AM

## 2014-05-25 NOTE — Progress Notes (Signed)
Patient  Had several loose stools today. Spoke to [facility] [place] concerning for c diff. Patient recently tested negative on the 8 th.  No fevers present and will not repeat C diff at this time.

## 2014-05-25 NOTE — Progress Notes (Signed)
Review of patient's condition related to continued loose stool and need for repeat C-diff PCR per nursing protocol was requested by S.,RN on 5W.  Loose stools noted on admission and continuing through this admission. C-Diff PCR collected 05/22/2014. Results were negative.  No fever. No related changes. IP recommendation: do not need to repeat the C-Diff pcr at this time.   CSpeagle, RN, [facility].  05/25/2014 at 1403.

## 2014-05-25 NOTE — Discharge Instructions (Signed)

## 2014-05-25 NOTE — Care Management Note (Signed)
    Page 1 of 2   06/10/2014     12:17:44 PM CARE MANAGEMENT NOTE 06/13/2014  Patient:  Justin Gilmore, Justin Gilmore   Account Number:  [id number]  Date Initiated:  05/25/2014  Documentation initiated by:  Lorne Skeens  Subjective/Objective Assessment:   Patient was admitted with sepsis.     Action/Plan:   Will follow for discharge needs.   Anticipated DC Date:  06/06/2014   Anticipated DC Plan:  [place]  CM consult      [facility] Choice  HOME HEALTH   Choice offered to / List presented to:  C-1 Patient        [place] arranged  HH-1 RN  [place].   Status of service:  Completed, signed off Medicare Important Message given?  YES (If response is "NO", the following Medicare IM given date fields will be blank) Date Medicare IM given:  05/25/2014 Medicare IM given by:  Lorne Skeens Date Additional Medicare IM given:   Additional Medicare IM given by:    Discharge Disposition:  [place]  Per UR Regulation:  Reviewed for med. necessity/level of care/duration of stay  If discussed at [place] of Stay Meetings, dates discussed:    Comments:  06/08/2014 12:15 CM spoke with pt and family to offer choice of home health agency.  Pt chooses [facility] to render HHPT/OT/RN.  Address and contact informaion verified by sister, Justin Gilmore [phone].  Referral called to [facility] rep, Justin Gilmore.  Pt has rolling walker at home.  No other Cm needs were communicated.  Justin Gilmore, BSN, [place] [phone].  05/25/14 [place] RN, MSN, CM- Medicare IM letter provided.

## 2014-05-26 DIAGNOSIS — N179 Acute kidney failure, unspecified: Secondary | ICD-10-CM

## 2014-05-26 LAB — BASIC METABOLIC PANEL
ANION GAP: 8 (ref 5–15)
BUN: 8 mg/dL (ref 6–23)
CHLORIDE: 116 mmol/L — AB (ref 96–112)
CO2: 16 mmol/L — ABNORMAL LOW (ref 19–32)
CREATININE: 1.91 mg/dL — AB (ref 0.50–1.35)
Calcium: 6.8 mg/dL — ABNORMAL LOW (ref 8.4–10.5)
GFR, EST AFRICAN AMERICAN: 40 mL/min — AB (ref >90)
GFR, EST NON AFRICAN AMERICAN: 35 mL/min — AB (ref >90)
Glucose, Bld: 90 mg/dL (ref 70–99)
Potassium: 4.3 mmol/L (ref 3.5–5.1)
Sodium: 140 mmol/L (ref 135–145)

## 2014-05-26 MED ORDER — LORAZEPAM 0.5 MG PO TABS
0.5000 mg | ORAL_TABLET | Freq: Three times a day (TID) | ORAL | Status: DC | PRN
  Administered 2014-05-26 – 2014-05-27 (×2): 0.5 mg via ORAL
  Filled 2014-05-26 (×2): qty 1

## 2014-05-26 NOTE — Progress Notes (Signed)
[facility] PROGRESS NOTE  Justin Gilmore:[id number] DOB: 25-Aug-1945 DOA: 05/21/2014 PCP: Robert Bellow, MD  Assessment/Plan: Principal Problem:   Sepsis Active Problems:   Hypertension   GERD (gastroesophageal reflux disease)   ETOH abuse   Atherosclerosis of native arteries of extremity with intermittent claudication   Lower extremity edema   Perianal abscess   Severe Sepsis: Suspected secondary to left perianal abscess and UTI.  -General surgery helping with I & D -will narrow antibiotics to doxycycline and ciprofloxacin; continue supportive care -C. Difficile negative -no leukocytosis, no fever and with improve BP  Fatty infiltration of the liver/small pseudocyst vs mass in the tail of the pancreas/Ascites/cirrhosis -Liver function normal -Lipase within normal limits -so far unsuccessful MRI abd/MRCP (patient unable to stay still during procedure) -Status post US paracentesis; no abnormalities isolated on ascitic fluid -patient has not been able to stay still for MRI of abdomen; will need further follow up as an outpatient  -will start spironolactone and lasix  . Acute on chronic renal failure stage III: Suspect acute renal failure likely prerenal azotemia -Cr trending down after IVF's -will minimize use of nephrotoxic agents -Pharmacy to help adjust any medications for renal function -since has remained stable will start low dose spironolactone and lasix  . Lower extremity edema: Has significant hypoalbuminemia-suspect this may be from underlying undiagnosed cirrhosis given significant history of alcohol abuse.  -ABI with moderate arterial disease -will start low dose diuretics combination in am (spironolactone and lasix) -Venous Doppler negative -2-D echo: With preserved ejection fraction, no wall motion abnormality; normal cavity size and no hypertrophy appreciated.    . Perianal abscess:  -s/p I & D by [facility] -continue antibiotics, but will transition to  by mouth regimen -Continue wound care and supportive care   Pseudomonas UTI -base on sensitivity will switch antibiotics to cipro BID -plan is to treat for a total of 7 days  . Anemia:  -combination of mild iron deficiency with AOCD and chronic alcohol use -once infection controlled will start iron supplementation -patient will also need B supplements -no transfusion need currently, but will follow Hgb trend -stool guaiac negative  . Thrombocytopenia: Perhaps secondary to presumed liver cirrhosis and from heavy alcohol drinking -no signs of acute bleeding -follow platelets trend  . GERD (gastroesophageal reflux disease): Continue PPI   . Atherosclerosis of native arteries of extremity with intermittent claudication: Has had prior bypass surgery in his right lower extremities.  -Both lower extremities are significantly swollen, but appear warm to touch -ABI demonstrating moderate arterial disease; will need outpatient follow up with vascular surgery -advised to quit smoking  . Hypertension: BP is stable  -continue heart healthy diet -lasix and spironolactone has been resumed today; will follow BP  . ETOH abuse: cessation counseling provided,  -no withdrawal appreciated  . Severe Malnutrition: Consult dietitian -will follow rec's and start nutrition supplements    Diarrhea: from antibiotics most likely -will continue florastor -C. Diff neg   Code Status: full Family Communication: family updated about patient's clinical progress Disposition Plan:  Organized outpatient care; assess level of functionality with PT; most likely home in 1-2 days   Brief narrative: Justin Gilmore is a 69 y.o. male with a Past Medical History of peripheral vascular disease status post right leg bypass in 2012, ongoing alcohol abuse, hypertension, prior history of DVT not on Coumadin who presents today with the above noted complaint. Patient claims that for the past week or so he has been much  more  weaker than usual, claims that his bilateral lower extremities have had significant swelling than his usual baseline. He also claims that he thinks that his right leg is not just holding up right, and has had problems ambulating with that particular leg. Per family/patient patient is very minimally ambulatory and only/barely walks around the house. He also complains that he has noticed a rash in his bilateral lower extremities. Denies any pain. He denies any fever-but patient's girlfriend does claim to the patient's been having chills. Patient does not complain of shortness of breath at rest, however his family has noticed some mild dyspnea on exertion. Patient does claim that he has PND and gets up multiple times at night because of shortness of breath. He however denies orthopnea. No history of nausea or vomiting. He claims he had 2 loose stools yesterday. No chest pain. He continues to drink around 2-312 ounce cans of beer on a daily basis along with a shot of liquor. He continues to smoke  Consultants:  General surgery  IR  Procedures:  2-D echo: - Left ventricle: The cavity size was normal. Systolic function was normal. The estimated ejection fraction was in the range of 50% to 55%. Wall motion was normal; there were no regional wall motion abnormalities. - Mitral valve: There was mild regurgitation.   ABI: Demonstrated Moderate arterial disease bilaterally  Antibiotics: Clindamycin, Zosyn, vancomycin started 3/7>>>3/10 Ciprofloxacin 3/11 levaquin 3/10>>3/11 Doxycycline 3/10   HPI/Subjective: Patient afebrile, feeling better and currently complaining of some anxiety only  Objective: Filed Vitals:   05/26/14 0018 05/26/14 0539 05/26/14 1205 05/26/14 1400  BP: 123/72 118/57 132/82 134/81  Pulse: 89 84 110 89  Temp:  99.1 F (37.3 C) 98.5 F (36.9 C) 97.7 F (36.5 C)  TempSrc:  Oral Oral Oral  Resp: 18 16  16   Height:      Weight:      SpO2: 94% 97% 95% 95%     Intake/Output Summary (Last 24 hours) at 05/26/14 1659 Last data filed at 05/26/14 1310  Gross per 24 hour  Intake    480 ml  Output    250 ml  Net    230 ml    Exam:  General: No acute respiratory disstres; no fever. Feeling a lot better and complaining of just some anxiety. Per nursing reports positive loose stools. No dysuria Lungs: positive rhonchi, no wheezing; good air movement Cardiovascular: Regular rate and rhythm without murmur gallop or rub normal S1 and S2 Abdomen: Nontender, mild distension on exam, decrease positive wave signs. Positive bowel sounds positive Extremities: No cyanosis or clubbing; positive edema (2++ bilaterally) Skin: left buttock wound without to much drainage, clean edges and no erythema. No much pain according to patient   Data Reviewed: Basic Metabolic Panel:  Recent Labs Lab 05/21/14 1345 05/22/14 0347 05/23/14 0425 05/24/14 0519 05/26/14 0546  NA 138 140 141 140 140  K 4.9 4.5 3.5 3.4* 4.3  CL 109 116* 114* 115* 116*  CO2 18* 21 20 19  16*  GLUCOSE 132* 87 81 94 90  BUN 17 16 11 11 8   CREATININE 2.17* 2.00* 1.93* 2.00* 1.91*  CALCIUM 6.6* 6.5* 6.5* 6.3* 6.8*    Liver Function Tests:  Recent Labs Lab 05/21/14 1345 05/22/14 0347 05/23/14 0425  AST 43* 51* 53*  ALT 38 35 39  ALKPHOS 150* 143* 134*  BILITOT 1.2 1.2 1.4*  PROT 4.0* 3.9* 3.8*  ALBUMIN 1.2* 1.1* 1.0*    Recent Labs Lab 05/22/14 0347  LIPASE 13   CBC:  Recent Labs Lab 05/21/14 1345 05/22/14 0347 05/23/14 0425 05/24/14 0519  WBC 5.7 4.9 3.4* 3.5*  NEUTROABS 4.4  --   --   --   HGB 8.9* 8.6* 8.0* 7.8*  HCT 26.9* 25.7* 23.3* 23.3*  MCV 90.9 87.7 86.6 87.6  PLT 116* 112* 101* 113*   BNP (last 3 results)  Recent Labs  05/21/14 1759  BNP 701.4*    Recent Results (from the past 240 hour(s))  Blood culture (routine x 2)     Status: None (Preliminary result)   Collection Time: 05/21/14  4:18 PM  Result Value Ref Range Status   Specimen  Description BLOOD HAND LEFT  Final   Special Requests BOTTLES DRAWN AEROBIC AND ANAEROBIC 5CC  Final   Culture   Final           BLOOD CULTURE RECEIVED NO GROWTH TO DATE CULTURE WILL BE HELD FOR 5 DAYS BEFORE ISSUING A FINAL NEGATIVE REPORT Performed at [facility]    Report Status PENDING  Incomplete  Blood culture (routine x 2)     Status: None (Preliminary result)   Collection Time: 05/21/14  4:27 PM  Result Value Ref Range Status   Specimen Description BLOOD HAND RIGHT  Final   Special Requests BOTTLES DRAWN AEROBIC AND ANAEROBIC 3CC  Final   Culture   Final           BLOOD CULTURE RECEIVED NO GROWTH TO DATE CULTURE WILL BE HELD FOR 5 DAYS BEFORE ISSUING A FINAL NEGATIVE REPORT Performed at [facility]    Report Status PENDING  Incomplete  Urine culture     Status: None   Collection Time: 05/21/14  5:09 PM  Result Value Ref Range Status   Specimen Description URINE, CLEAN CATCH  Final   Special Requests NONE  Final   Colony Count   Final    >=100,000 COLONIES/ML Performed at [facility]    Culture   Final    PSEUDOMONAS AERUGINOSA Performed at [facility]    Report Status 05/25/2014 FINAL  Final   Organism ID, Bacteria PSEUDOMONAS AERUGINOSA  Final      Susceptibility   Pseudomonas aeruginosa - MIC*    CEFEPIME 4 SENSITIVE Sensitive     CEFTAZIDIME 4 SENSITIVE Sensitive     CIPROFLOXACIN <=0.25 SENSITIVE Sensitive     GENTAMICIN 2 SENSITIVE Sensitive     IMIPENEM 2 SENSITIVE Sensitive     PIP/TAZO 16 SENSITIVE Sensitive     TOBRAMYCIN <=1 SENSITIVE Sensitive     * PSEUDOMONAS AERUGINOSA  MRSA PCR Screening     Status: None   Collection Time: 05/21/14  9:43 PM  Result Value Ref Range Status   MRSA by PCR NEGATIVE NEGATIVE Final    Comment:        The GeneXpert MRSA Assay (FDA approved for NASAL specimens only), is one component of a comprehensive MRSA colonization surveillance program. It is not intended to diagnose  MRSA infection nor to guide or monitor treatment for MRSA infections.   Clostridium Difficile by PCR     Status: None   Collection Time: 05/22/14 12:33 PM  Result Value Ref Range Status   C difficile by pcr NEGATIVE NEGATIVE Final  Body fluid culture     Status: None (Preliminary result)   Collection Time: 05/23/14  4:40 PM  Result Value Ref Range Status   Specimen Description ASCITIC ABDOMEN  Final   Special Requests NONE  Final   Gram Stain   Final    RARE WBC PRESENT, PREDOMINANTLY PMN NO ORGANISMS SEEN Performed at [facility]    Culture   Final    NO GROWTH 2 DAYS Performed at [facility]    Report Status PENDING  Incomplete     Studies: Ct Abdomen Pelvis Wo Contrast  05/21/2014   CLINICAL DATA:  Right lower extremity pain and swelling. Hypotension. Decreased hemoglobin.  EXAM: CT ABDOMEN AND PELVIS WITHOUT CONTRAST  TECHNIQUE: Multidetector CT imaging of the abdomen and pelvis was performed following the standard protocol without IV contrast.  COMPARISON:  CT abdomen and pelvis 05/12/2006.  FINDINGS: The patient has small bilateral pleural effusions. No pericardial effusion is noted. Calcific coronary artery disease is seen.  There is marked fatty infiltration of the liver diffusely. The gallbladder and right adrenal gland are unremarkable. The tail of the pancreas appears expanded and is contiguous with the upper pole the left kidney and splenic hilum. There may be a mass in the pancreatic tail. Additionally, anterior and lateral to the neck of the pancreas, there is a low attenuating focus measuring 2.3 cm AP x 2.7 cm transverse on image 36 with Hounsfield unit of measurements of 10.9. A low attenuating left adrenal nodule consistent with an adenoma is unchanged. A hyper attenuating lesion the midpole left kidney measures 1.4 cm in diameter and likely represents a complex cyst but cannot be definitively characterized.  Along the descending duodenum, the patient  appears to have a duodenum diverticulum. The small bowel is otherwise unremarkable. The walls of the colon are diffusely thickened. While this could be secondary to colitis, this may be related to the patient's hepatic insufficiency as changes are most notable in the ascending and proximal transverse colon. A small volume of upper abdominal ascites is identified. Extensive aortoiliac atherosclerosis is noted.  In the left buttock, there is partial visualization of a lesion measuring 4.4 cm transverse by 3.2 cm AP on image 94. Diffuse body wall edema is noted. No lytic or sclerotic bony lesion is seen.  IMPRESSION: Diffuse and marked fatty infiltration of the liver. Small volume of perisplenic ascites is likely related to hepatic insufficiency.  Expanded tail of the pancreas which is contiguous with the left kidney and splenic hilum is not included on prior studies. Mass in the pancreatic tail is possible. A low attenuating lesion off the neck of the pancreas anterior and laterally may be a pancreatic pseudocyst but cannot be definitively characterized. MRI of the abdomen with contrast is recommended. MRI should be deferred until the patient is able to follow breath holding instructions. This could be performed as an outpatient.  Wall thickening of the colon most notable in the ascending and transverse is likely related to hepatic insufficiency although colitis could create a similar appearance.  Low attenuating lesion in the left buttock is worrisome for abscess.  Anasarca.  Extensive atherosclerosis.   Electronically Signed   By: Inge Rise M.D.   On: 05/21/2014 19:25   Dg Chest 1 View  05/21/2014   CLINICAL DATA:  Bilateral leg swelling and shortness of breath.  EXAM: CHEST  1 VIEW  COMPARISON:  02/07/2014  FINDINGS: Artifact overlies chest. The patient has taken a poor inspiration. Heart size is at the upper limits of normal. Mediastinal shadows are normal. There is probably pulmonary venous hypertension  but there is no frank edema. No effusions. No collapse or infiltrate.  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  No frank  edema.   Electronically Signed   By: Nelson Chimes M.D.   On: 05/21/2014 15:29   Dg Pelvis 1-2 Views  05/22/2014   CLINICAL DATA:  Right buttock abscess.  EXAM: PELVIS - 1-2 VIEW  COMPARISON:  05/21/2014  FINDINGS: Oral contrast material demonstrated within the descending and sigmoid colon. Lower lumbar spine degenerative change. Osseous pelvis is unremarkable. Vascular calcifications.  IMPRESSION: Lumbar spine degenerative changes.  Osseous skeleton are unremarkable.   Electronically Signed   By: Lovey Newcomer M.D.   On: 05/22/2014 10:23   Mr Hip Right Wo Contrast  05/22/2014   CLINICAL DATA:  Right hip pain. History of perianal abscess, alcoholic cirrhosis and renal failure. Evaluate for avascular necrosis. Initial encounter.  EXAM: MR OF THE RIGHT HIP WITHOUT CONTRAST  TECHNIQUE: Multiplanar, multisequence MR imaging was performed. No intravenous contrast was administered.  COMPARISON:  Pelvic radiographs 05/2014.  Pelvic CT 05/21/2014.  FINDINGS: Study is mildly motion degraded. Image quality is also degraded by diffuse soft tissue edema.  Bones: Both femoral heads appear normal without evidence of acute fracture, dislocation or avascular necrosis. No focal or acute abnormalities are identified within the bony pelvis. The bone marrow is diffusely heterogeneous with mottled T1 hypo intensities, most pronounced within the lumbar spine. There is no suspicious marrow lesion on inversion recovery imaging, and this is probably due to osteoporosis. There is no evidence of sacroiliitis.  Articular cartilage and labrum  Articular cartilage: No focal chondral defect or subchondral signal abnormality identified.  Labrum: There is no gross labral tear or paralabral abnormality.  Joint or bursal effusion  Joint effusion: Small bilateral hip joint effusions.  Bursae: No focal periarticular  fluid collections identified. However, there is extensive subcutaneous edema throughout the pelvis and proximal thighs. There is also mild muscular edema bilaterally.  Muscles and tendons  Muscles and tendons: The gluteus, hamstring and iliopsoas tendons appear intact. The piriformis muscles are symmetric. As above, there is mild diffuse muscular edema throughout the pelvis.  Other findings  Miscellaneous: The previously demonstrated left perianal subcutaneous fluid collection is incompletely visualized by the current examination, although grossly stable. There is mild pelvic ascites which appears new. 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  IMPRESSION: 1. No evidence of femoral head avascular necrosis, osteomyelitis or acute osseous findings. 2. Progressive anasarca with generalized soft tissue edema, pelvic ascites and small bilateral hip joint effusions. No new focal fluid collections identified. The left perianal fluid collection is grossly stable, although incompletely visualized.   Electronically Signed   By: Richardean Sale M.D.   On: 05/22/2014 14:19   US Paracentesis  05/24/2014   CLINICAL DATA:  Ascites  EXAM: ULTRASOUND GUIDED PARACENTESIS  TECHNIQUE: Survey ultrasound of the abdomen was performed and an appropriate skin entry site in the right lower abdomen was selected. Skin site was marked, prepped with Betadine, and draped in usual sterile fashion, and infiltrated locally with 1% lidocaine. A 5 French multisidehole Yueh sheath needle was advanced into the peritoneal space until fluid could be aspirated. The sheath was advanced and the needle removed. 400 cc of clearascites were aspirated.  COMPLICATIONS: None  IMPRESSION: Technically successful ultrasound guided paracentesis, removing 400 cc of ascites.   Electronically Signed   By: Marybelle Killings M.D.   On: 05/24/2014 08:22   Dg Chest Port 1 View  05/22/2014   CLINICAL DATA:  Shortness of breath.  EXAM: PORTABLE CHEST  - 1 VIEW  COMPARISON:  Chest radiograph 05/21/2014  FINDINGS: Monitoring leads overlie the patient. Stable cardiac and mediastinal contours. No consolidative pulmonary opacities. No pleural effusion or pneumothorax. Regional skeleton is unremarkable.  IMPRESSION: No acute cardiopulmonary process.   Electronically Signed   By: Lovey Newcomer M.D.   On: 05/22/2014 09:57    Scheduled Meds: . calcium-vitamin D  1 tablet Oral Q breakfast  . ciprofloxacin  500 mg Oral BID  . doxycycline  100 mg Oral Q12H  . folic acid  1 mg Oral Daily  . furosemide  10 mg Oral Daily  . heparin  5,000 Units Subcutaneous 3 times per day  . multivitamin with minerals  1 tablet Oral Daily  . pantoprazole  40 mg Oral Daily  . saccharomyces boulardii  250 mg Oral BID  . spironolactone  12.5 mg Oral Daily  . thiamine  100 mg Oral Daily   Continuous Infusions:    Principal Problem:   Sepsis Active Problems:   Hypertension   GERD (gastroesophageal reflux disease)   ETOH abuse   Atherosclerosis of native arteries of extremity with intermittent claudication   Lower extremity edema   Perianal abscess   Time spent: 30 minutes   Barton Dubois  [facility] Pager [phone]. If 7PM-7AM, please contact night-coverage at [email or web address], password [facility] 05/26/2014, 4:59 PM  LOS: 5 days

## 2014-05-26 NOTE — Evaluation (Addendum)
Physical Therapy Evaluation Patient Details Name: TAQUAN BRALLEY MRN: [id number] DOB: 29-Jul-1945 Today's Date: 05/26/2014   History of Present Illness  Pt admit with sepsis.  Clinical Impression  Pt admitted with above diagnosis. Pt currently with functional limitations due to the deficits listed below (see PT Problem List). Pt will need 24 hour care.  HHPT if 24 hour care.  If not, will need NHP.  Pt will benefit from skilled PT to increase their independence and safety with mobility to allow discharge to the venue listed below.     Follow Up Recommendations Home health PT;Supervision/Assistance - 24 hour    Equipment Recommendations  Other (comment) (TBA)    Recommendations for Other Services       Precautions / Restrictions Precautions Precautions: Fall Restrictions Weight Bearing Restrictions: No      Mobility  Bed Mobility Overal bed mobility: Needs Assistance Bed Mobility: Supine to Sit     Supine to sit: Min assist     General bed mobility comments: Needed assist for LES and elevation of trunk taking incr time.  Transfers Overall transfer level: Needs assistance Equipment used: Rolling walker (2 wheeled) Transfers: Sit to/from Stand Sit to Stand: Min assist         General transfer comment: Needed cues for hand placement and assist to power up.  Ambulation/Gait Ambulation/Gait assistance: Min assist;+2 safety/equipment Ambulation Distance (Feet): 30 Feet (15 feet x 2) Assistive device: Rolling walker (2 wheeled) Gait Pattern/deviations: Step-through pattern;Decreased stride length;Trunk flexed;Wide base of support   Gait velocity interpretation: Below normal speed for age/gender General Gait Details: Upon standing, noted stool on pad on bed.  Asked pt if he wanted to use restroom and pt agreed.  Pt needed assist to get down on to toilet.  Min assist to rise from toilet and total assit to clean bottom as pt could not do it.  Noted that pt has a dressing on  buttock as well.  Pt ambulating with steadying assist and cues and assist to steer RW and stay close to RW.    Stairs            Wheelchair Mobility    Modified Rankin (Stroke Patients Only)       Balance Overall balance assessment: Needs assistance;History of Falls         Standing balance support: Bilateral upper extremity supported;During functional activity Standing balance-Leahy Scale: Poor Standing balance comment: requires UE support bil for stability in standing.                              Pertinent Vitals/Pain Pain Assessment: No/denies pain  HR 87-125 bpm with activity; O2 90-94% on RA.  BP 132/82.     Home Living Family/patient expects to be discharged to:: Private residence Living Arrangements: Spouse/significant other;Children Available Help at Discharge: Family;Available 24 hours/day Type of Home: House Home Access: Level entry     Home Layout: One level Home Equipment: Walker - 2 wheels;Bedside commode;Shower seat      Prior Function Level of Independence: Independent with assistive device(s)               Hand Dominance        Extremity/Trunk Assessment   Upper Extremity Assessment: Defer to OT evaluation           Lower Extremity Assessment: Generalized weakness         Communication   Communication: No difficulties  Cognition Arousal/Alertness:  Awake/alert Behavior During Therapy: WFL for tasks assessed/performed Overall Cognitive Status: History of cognitive impairments - at baseline                      General Comments      Exercises        Assessment/Plan    PT Assessment Patient needs continued PT services  PT Diagnosis Generalized weakness   PT Problem List Decreased balance;Decreased activity tolerance;Decreased mobility;Decreased knowledge of use of DME;Decreased safety awareness;Decreased knowledge of precautions  PT Treatment Interventions DME instruction;Gait  training;Functional mobility training;Therapeutic activities;Therapeutic exercise;Balance training;Patient/family education   PT Goals (Current goals can be found in the Care Plan section) Acute Rehab PT Goals Patient Stated Goal: to go home PT Goal Formulation: With patient Time For Goal Achievement: 06/09/14 Potential to Achieve Goals: Good    Frequency Min 3X/week   Barriers to discharge        Co-evaluation               End of Session Equipment Utilized During Treatment: Gait belt Activity Tolerance: Patient limited by fatigue Patient left: in chair;with call bell/phone within reach;with chair alarm set Nurse Communication: Mobility status         Time: 7124-5809 PT Time Calculation (min) (ACUTE ONLY): 18 min   Charges:   PT Evaluation $Initial PT Evaluation Tier I: 1 Procedure     PT G CodesDenice Paradise Jun 13, 2014, 2:55 PM [facility] Acute Rehabilitation [phone] (pager)

## 2014-05-27 ENCOUNTER — Other Ambulatory Visit (HOSPITAL_COMMUNITY): Payer: Self-pay

## 2014-05-27 ENCOUNTER — Inpatient Hospital Stay (HOSPITAL_COMMUNITY)
Admission: EM | Admit: 2014-05-27 | Discharge: 2014-06-15 | DRG: 296 | Disposition: E | Payer: Medicare Other | Attending: Pulmonary Disease | Admitting: Pulmonary Disease

## 2014-05-27 DIAGNOSIS — N179 Acute kidney failure, unspecified: Secondary | ICD-10-CM | POA: Diagnosis present

## 2014-05-27 DIAGNOSIS — N189 Chronic kidney disease, unspecified: Secondary | ICD-10-CM

## 2014-05-27 DIAGNOSIS — R7989 Other specified abnormal findings of blood chemistry: Secondary | ICD-10-CM

## 2014-05-27 DIAGNOSIS — K8689 Other specified diseases of pancreas: Secondary | ICD-10-CM | POA: Insufficient documentation

## 2014-05-27 DIAGNOSIS — Z79899 Other long term (current) drug therapy: Secondary | ICD-10-CM

## 2014-05-27 DIAGNOSIS — J9692 Respiratory failure, unspecified with hypercapnia: Secondary | ICD-10-CM | POA: Diagnosis present

## 2014-05-27 DIAGNOSIS — I469 Cardiac arrest, cause unspecified: Secondary | ICD-10-CM | POA: Diagnosis not present

## 2014-05-27 DIAGNOSIS — Z66 Do not resuscitate: Secondary | ICD-10-CM | POA: Diagnosis present

## 2014-05-27 DIAGNOSIS — K869 Disease of pancreas, unspecified: Secondary | ICD-10-CM

## 2014-05-27 DIAGNOSIS — I639 Cerebral infarction, unspecified: Secondary | ICD-10-CM | POA: Diagnosis present

## 2014-05-27 DIAGNOSIS — J449 Chronic obstructive pulmonary disease, unspecified: Secondary | ICD-10-CM | POA: Diagnosis present

## 2014-05-27 DIAGNOSIS — Z8249 Family history of ischemic heart disease and other diseases of the circulatory system: Secondary | ICD-10-CM

## 2014-05-27 DIAGNOSIS — F1010 Alcohol abuse, uncomplicated: Secondary | ICD-10-CM | POA: Diagnosis present

## 2014-05-27 DIAGNOSIS — G931 Anoxic brain damage, not elsewhere classified: Secondary | ICD-10-CM | POA: Diagnosis present

## 2014-05-27 DIAGNOSIS — K7031 Alcoholic cirrhosis of liver with ascites: Secondary | ICD-10-CM | POA: Diagnosis present

## 2014-05-27 DIAGNOSIS — Z452 Encounter for adjustment and management of vascular access device: Secondary | ICD-10-CM

## 2014-05-27 DIAGNOSIS — K210 Gastro-esophageal reflux disease with esophagitis: Secondary | ICD-10-CM | POA: Diagnosis present

## 2014-05-27 DIAGNOSIS — D649 Anemia, unspecified: Secondary | ICD-10-CM | POA: Diagnosis present

## 2014-05-27 DIAGNOSIS — R778 Other specified abnormalities of plasma proteins: Secondary | ICD-10-CM | POA: Insufficient documentation

## 2014-05-27 DIAGNOSIS — F17210 Nicotine dependence, cigarettes, uncomplicated: Secondary | ICD-10-CM | POA: Diagnosis present

## 2014-05-27 DIAGNOSIS — J9601 Acute respiratory failure with hypoxia: Secondary | ICD-10-CM | POA: Diagnosis present

## 2014-05-27 DIAGNOSIS — Z7982 Long term (current) use of aspirin: Secondary | ICD-10-CM

## 2014-05-27 DIAGNOSIS — G936 Cerebral edema: Secondary | ICD-10-CM | POA: Diagnosis present

## 2014-05-27 DIAGNOSIS — I129 Hypertensive chronic kidney disease with stage 1 through stage 4 chronic kidney disease, or unspecified chronic kidney disease: Secondary | ICD-10-CM | POA: Diagnosis present

## 2014-05-27 DIAGNOSIS — I10 Essential (primary) hypertension: Secondary | ICD-10-CM | POA: Diagnosis present

## 2014-05-27 DIAGNOSIS — I70219 Atherosclerosis of native arteries of extremities with intermittent claudication, unspecified extremity: Secondary | ICD-10-CM

## 2014-05-27 DIAGNOSIS — N184 Chronic kidney disease, stage 4 (severe): Secondary | ICD-10-CM | POA: Diagnosis present

## 2014-05-27 DIAGNOSIS — N390 Urinary tract infection, site not specified: Secondary | ICD-10-CM | POA: Diagnosis present

## 2014-05-27 DIAGNOSIS — I739 Peripheral vascular disease, unspecified: Secondary | ICD-10-CM | POA: Diagnosis present

## 2014-05-27 DIAGNOSIS — Z9289 Personal history of other medical treatment: Secondary | ICD-10-CM

## 2014-05-27 DIAGNOSIS — I4891 Unspecified atrial fibrillation: Secondary | ICD-10-CM | POA: Diagnosis not present

## 2014-05-27 DIAGNOSIS — K219 Gastro-esophageal reflux disease without esophagitis: Secondary | ICD-10-CM | POA: Diagnosis present

## 2014-05-27 DIAGNOSIS — Z79891 Long term (current) use of opiate analgesic: Secondary | ICD-10-CM

## 2014-05-27 DIAGNOSIS — R188 Other ascites: Secondary | ICD-10-CM | POA: Insufficient documentation

## 2014-05-27 DIAGNOSIS — R748 Abnormal levels of other serum enzymes: Secondary | ICD-10-CM | POA: Diagnosis present

## 2014-05-27 LAB — I-STAT CHEM 8, ED
BUN: 8 mg/dL (ref 6–23)
Calcium, Ion: 1.16 mmol/L (ref 1.13–1.30)
Chloride: 115 mmol/L — ABNORMAL HIGH (ref 96–112)
Creatinine, Ser: 2.1 mg/dL — ABNORMAL HIGH (ref 0.50–1.35)
Glucose, Bld: 191 mg/dL — ABNORMAL HIGH (ref 70–99)
HCT: 26 % — ABNORMAL LOW (ref 39.0–52.0)
Hemoglobin: 8.8 g/dL — ABNORMAL LOW (ref 13.0–17.0)
POTASSIUM: 5.3 mmol/L — AB (ref 3.5–5.1)
Sodium: 142 mmol/L (ref 135–145)
TCO2: 9 mmol/L (ref 0–100)

## 2014-05-27 LAB — BASIC METABOLIC PANEL
Anion gap: 7 (ref 5–15)
BUN: 7 mg/dL (ref 6–23)
CHLORIDE: 114 mmol/L — AB (ref 96–112)
CO2: 19 mmol/L (ref 19–32)
Calcium: 6.9 mg/dL — ABNORMAL LOW (ref 8.4–10.5)
Creatinine, Ser: 1.98 mg/dL — ABNORMAL HIGH (ref 0.50–1.35)
GFR calc Af Amer: 38 mL/min — ABNORMAL LOW (ref >90)
GFR, EST NON AFRICAN AMERICAN: 33 mL/min — AB (ref >90)
GLUCOSE: 109 mg/dL — AB (ref 70–99)
POTASSIUM: 4.3 mmol/L (ref 3.5–5.1)
SODIUM: 140 mmol/L (ref 135–145)

## 2014-05-27 LAB — BODY FLUID CULTURE: CULTURE: NO GROWTH

## 2014-05-27 LAB — I-STAT CG4 LACTIC ACID, ED: Lactic Acid, Venous: 10.76 mmol/L (ref 0.5–2.0)

## 2014-05-27 MED ORDER — SODIUM BICARBONATE 4 % IV SOLN: 5.0000 mL | Freq: Once | INTRAVENOUS | Status: DC

## 2014-05-27 MED ORDER — FUROSEMIDE 20 MG PO TABS: 20.0000 mg | ORAL_TABLET | Freq: Every day | ORAL | Status: AC

## 2014-05-27 MED ORDER — NICOTINE 21 MG/24HR TD PT24: 21.0000 mg | MEDICATED_PATCH | Freq: Every day | TRANSDERMAL | Status: AC

## 2014-05-27 MED ORDER — SACCHAROMYCES BOULARDII 250 MG PO CAPS: 250.0000 mg | ORAL_CAPSULE | Freq: Two times a day (BID) | ORAL | Status: AC

## 2014-05-27 MED ORDER — ALBUTEROL SULFATE HFA 108 (90 BASE) MCG/ACT IN AERS: 1.0000 | INHALATION_SPRAY | Freq: Four times a day (QID) | RESPIRATORY_TRACT | Status: AC | PRN

## 2014-05-27 MED ORDER — METOPROLOL TARTRATE 25 MG PO TABS: 12.5000 mg | ORAL_TABLET | Freq: Two times a day (BID) | ORAL | Status: AC

## 2014-05-27 MED ORDER — ADULT MULTIVITAMIN W/MINERALS CH: 1.0000 | ORAL_TABLET | Freq: Every day | ORAL | Status: AC

## 2014-05-27 MED ORDER — SODIUM CHLORIDE 0.9 % IV SOLN
2000.0000 mL | Freq: Once | INTRAVENOUS | Status: AC
  Administered 2014-05-27: 2000 mL via INTRAVENOUS

## 2014-05-27 MED ORDER — NOREPINEPHRINE BITARTRATE 1 MG/ML IV SOLN
0.0000 ug/min | Freq: Once | INTRAVENOUS | Status: AC
  Administered 2014-05-27: 2 ug/min via INTRAVENOUS
  Filled 2014-05-27: qty 4

## 2014-05-27 MED ORDER — CALCIUM CARBONATE-VITAMIN D 500-200 MG-UNIT PO TABS: 1.0000 | ORAL_TABLET | Freq: Every day | ORAL | Status: AC

## 2014-05-27 MED ORDER — CIPROFLOXACIN HCL 500 MG PO TABS: 500.0000 mg | ORAL_TABLET | Freq: Two times a day (BID) | ORAL | Status: AC

## 2014-05-27 MED ORDER — SPIRONOLACTONE 25 MG PO TABS: 12.5000 mg | ORAL_TABLET | Freq: Two times a day (BID) | ORAL | Status: AC

## 2014-05-27 MED ORDER — BUSPIRONE HCL 7.5 MG PO TABS: 7.5000 mg | ORAL_TABLET | Freq: Three times a day (TID) | ORAL | Status: AC

## 2014-05-27 MED ORDER — DOXYCYCLINE HYCLATE 100 MG PO TABS: 100.0000 mg | ORAL_TABLET | Freq: Two times a day (BID) | ORAL | Status: AC

## 2014-05-27 MED ORDER — FOLIC ACID 1 MG PO TABS: 1.0000 mg | ORAL_TABLET | Freq: Every day | ORAL | Status: AC

## 2014-05-27 NOTE — Progress Notes (Signed)
NURSING PROGRESS NOTE  Justin Gilmore [id number] Discharge Data: 06/09/2014 12:53 PM Attending Provider: Barton Dubois, MD FHL:[id number],LHTDSKA D, MD     Maretta Los to be D/C'd Home per MD order.  Discussed with the patient the After Visit Summary and all questions fully answered. All IV's discontinued with no bleeding noted. All belongings returned to patient for patient to take home.   Last Vital Signs:  Blood pressure 114/75, pulse 90, temperature 98.9 F (37.2 C), temperature source Oral, resp. rate 16, height 5\' 6"  (1.676 m), weight 78.9 kg (173 lb 15.1 oz), SpO2 91 %.  Discharge Medication List   Medication List    STOP taking these medications        amLODipine 10 MG tablet  Commonly known as:  NORVASC     loperamide 2 MG tablet  Commonly known as:  IMODIUM A-D      TAKE these medications        albuterol 108 (90 BASE) MCG/ACT inhaler  Commonly known as:  VENTOLIN HFA  Inhale 1 puff into the lungs every 6 (six) hours as needed for wheezing or shortness of breath.     aspirin 81 MG chewable tablet  Chew 81 mg by mouth daily.     busPIRone 7.5 MG tablet  Commonly known as:  BUSPAR  Take 1 tablet (7.5 mg total) by mouth 3 (three) times daily.     calcium-vitamin D 500-200 MG-UNIT per tablet  Commonly known as:  OSCAL WITH D  Take 1 tablet by mouth daily with breakfast.     chlorproMAZINE 10 MG tablet  Commonly known as:  THORAZINE  Take 10 mg by mouth 4 (four) times daily as needed for hiccoughs.     ciprofloxacin 500 MG tablet  Commonly known as:  CIPRO  Take 1 tablet (500 mg total) by mouth 2 (two) times daily.     doxycycline 100 MG tablet  Commonly known as:  VIBRA-TABS  Take 1 tablet (100 mg total) by mouth every 12 (twelve) hours.     folic acid 1 MG tablet  Commonly known as:  FOLVITE  Take 1 tablet (1 mg total) by mouth daily.     furosemide 20 MG tablet  Commonly known as:  LASIX  Take 1 tablet (20 mg total) by mouth daily.     HYDROcodone-acetaminophen 5-325 MG per tablet  Commonly known as:  NORCO/VICODIN  Take 1 tablet by mouth every 6 (six) hours as needed for moderate pain.     metoprolol tartrate 25 MG tablet  Commonly known as:  LOPRESSOR  Take 0.5 tablets (12.5 mg total) by mouth 2 (two) times daily.     multivitamin with minerals Tabs tablet  Take 1 tablet by mouth daily.     nicotine 21 mg/24hr patch  Commonly known as:  NICODERM CQ  Place 1 patch (21 mg total) onto the skin daily.     pantoprazole 40 MG tablet  Commonly known as:  PROTONIX  TAKE ONE TABLET BY MOUTH EVERY DAY     saccharomyces boulardii 250 MG capsule  Commonly known as:  FLORASTOR  Take 1 capsule (250 mg total) by mouth 2 (two) times daily.     spironolactone 25 MG tablet  Commonly known as:  ALDACTONE  Take 0.5 tablets (12.5 mg total) by mouth 2 (two) times daily.     SUPER B COMPLEX PO  Take by mouth daily.

## 2014-05-27 NOTE — ED Provider Notes (Signed)
CSN: [id number]     Arrival date & time 05/16/2014  2332 History  This chart was scribed for Lacretia Leigh, MD by Eustaquio Maize, ED Scribe. This patient was seen in room [facility] and the patient's care was started at 11:35 PM.    Chief Complaint  Patient presents with  . Post-CPR    LEVEL 5 CAVEAT  HPI Comments: Patient had 30 minutes of downtime prior to arrival. Patient's initial rhythm per EMS was pea and patient CBG was over 100. Patient was given ACLS protocol and had return of pulse and blood pressure after approximately 20 minutes  The history is provided by the EMS personnel. The history is limited by the condition of the patient. No language interpreter was used.   HPI Comments: Justin Gilmore is a 69 y.o. male brought in by ambulance, who presents to the Emergency Department post CPR. Per EMS, family found pt without a pulse. Family administered CPR for approximately 10 minutes before EMS arrived. Pt received approximately 30 minutes of CPR before arriving in the ED. Pt was given 5 epinephrine and calcium gluconate en route.    Past Medical History  Diagnosis Date  . Hypertension   . Tobacco abuse   . DVT (deep venous thrombosis) 2012    Right lower ext.  Marland Kitchen GERD (gastroesophageal reflux disease)   . Renal cyst   . Renal failure     Stage 2- Stage 3  . DVT (deep venous thrombosis) 11/11/2010  . Arterial insufficiency 11/11/2010  . Renal insufficiency 11/11/2010  . Hypertension 11/11/2010  . GERD (gastroesophageal reflux disease) 11/11/2010  . 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 ETOH abuse 12/10/2010  . Folic acid deficiency 5/88/5027  . Peripheral vascular disease   . Tubular adenoma of colon 2012  . H. pylori infection 2013    treated with prevpac   Past Surgical History  Procedure Laterality Date  . 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 Adele Barthel  . Back surgery    . Colonoscopy   04/22/2011    XAJ:OINOMVEH  colonic polyps - status post multiple snare polypectomies.  1 tubular adenoma, 1 hyperplastic colonic diverticulosis. Internal hemorrhoids  . Esophagogastroduodenoscopy  04/22/2011    RMR: H. pylori gastritis, peptic duodenitis, erosive reflux esophagitis; hiatal hernia   Family History  Problem Relation Age of Onset  . Diabetes Brother   . Deep vein thrombosis Brother   . Hyperlipidemia Brother   . Heart disease Brother     before age 35  . Heart attack Brother   . Hypertension Brother   . Colon cancer Neg Hx   . Cancer Mother   . Hypertension Mother   . Cancer Father   . Hypertension Father   . Heart disease Sister     Heart disease before age 68  . Hyperlipidemia Sister   . Heart attack Sister   . Hypertension Sister    History  Substance Use Topics  . Smoking status: Current Every Day Smoker -- 1.00 packs/day for 50 years    Types: Cigarettes  . Smokeless tobacco: Never Used  . Alcohol Use: 1.2 - 1.8 oz/week    2-3 Cans of beer per week     Comment: weekends beer & liquor few drinks    Review of Systems  Unable to perform ROS     Allergies  Review of patient's allergies indicates no known allergies.  Home Medications   Prior to Admission medications  Medication Sig Start Date End Date Taking? Authorizing Provider  albuterol (VENTOLIN HFA) 108 (90 BASE) MCG/ACT inhaler Inhale 1 puff into the lungs every 6 (six) hours as needed for wheezing or shortness of breath. 05/17/2014   Barton Dubois, MD  aspirin 81 MG chewable tablet Chew 81 mg by mouth daily.    Historical Provider, MD  B Complex-C (SUPER B COMPLEX PO) Take by mouth daily.    Historical Provider, MD  busPIRone (BUSPAR) 7.5 MG tablet Take 1 tablet (7.5 mg total) by mouth 3 (three) times daily. 05/29/2014   Barton Dubois, MD  calcium-vitamin D (OSCAL WITH D) 500-200 MG-UNIT per tablet Take 1 tablet by mouth daily with breakfast. 05/26/2014   Barton Dubois, MD  chlorproMAZINE (THORAZINE) 10 MG tablet Take 10 mg by  mouth 4 (four) times daily as needed for hiccoughs.  06/29/13   Historical Provider, MD  ciprofloxacin (CIPRO) 500 MG tablet Take 1 tablet (500 mg total) by mouth 2 (two) times daily. 06/04/2014   Barton Dubois, MD  doxycycline (VIBRA-TABS) 100 MG tablet Take 1 tablet (100 mg total) by mouth every 12 (twelve) hours. 06/05/2014   Barton Dubois, MD  folic acid (FOLVITE) 1 MG tablet Take 1 tablet (1 mg total) by mouth daily. 06/13/2014   Barton Dubois, MD  furosemide (LASIX) 20 MG tablet Take 1 tablet (20 mg total) by mouth daily. 06/10/2014   Barton Dubois, MD  HYDROcodone-acetaminophen (NORCO/VICODIN) 5-325 MG per tablet Take 1 tablet by mouth every 6 (six) hours as needed for moderate pain.  04/26/12   Historical Provider, MD  metoprolol (LOPRESSOR) 25 MG tablet Take 0.5 tablets (12.5 mg total) by mouth 2 (two) times daily. 05/26/2014   Barton Dubois, MD  Multiple Vitamin (MULTIVITAMIN WITH MINERALS) TABS tablet Take 1 tablet by mouth daily. 05/26/2014   Barton Dubois, MD  nicotine (NICODERM CQ) 21 mg/24hr patch Place 1 patch (21 mg total) onto the skin daily. 06/07/2014   Barton Dubois, MD  pantoprazole (PROTONIX) 40 MG tablet TAKE ONE TABLET BY MOUTH EVERY DAY 04/18/12   Andria Meuse, NP  saccharomyces boulardii (FLORASTOR) 250 MG capsule Take 1 capsule (250 mg total) by mouth 2 (two) times daily. 06/12/2014   Barton Dubois, MD  spironolactone (ALDACTONE) 25 MG tablet Take 0.5 tablets (12.5 mg total) by mouth 2 (two) times daily. 05/15/2014   Barton Dubois, MD   BP 71/47 mmHg  Pulse 97  Temp(Src) 97.9 F (36.6 C) (Rectal)  Resp 21  Ht 6' (1.829 m)  Wt 173 lb (78.472 kg)  BMI 23.46 kg/m2  SpO2 100%   Physical Exam  Constitutional: He appears well-developed and well-nourished. He has a sickly appearance. He appears ill.  Unresponsive.    HENT:  Head: Normocephalic and atraumatic.  Eyes: Conjunctivae, EOM and lids are normal. Pupils are equal, round, and reactive to light.  Neck: No tracheal deviation present. No  thyroid mass present.  Cardiovascular: Normal rate, regular rhythm and normal heart sounds.  Exam reveals no gallop.   No murmur heard. Pulmonary/Chest: Effort normal. No stridor. He has no decreased breath sounds. He has no rhonchi. He has rales.  Coarse breath sounds.   Abdominal: Soft. Normal appearance and bowel sounds are normal. He exhibits no distension. There is no CVA tenderness.  Musculoskeletal: Normal range of motion. He exhibits no edema or tenderness.  Neurological: He is unresponsive. GCS eye subscore is 1. GCS verbal subscore is 1. GCS motor subscore is 1.  Unresponsive. GCS 3  Skin:  Skin is warm. No abrasion noted.  Psychiatric: His speech is normal.  Nursing note and vitals reviewed.   ED Course  INTUBATION Date/Time: 05/28/2014 2:14 AM Performed by: Lacretia Leigh Authorized by: Lacretia Leigh Consent: The procedure was performed in an emergent situation. Patient identity confirmed: verbally with patient Time out: Immediately prior to procedure a "time out" was called to verify the correct patient, procedure, equipment, support staff and site/side marked as required. Indications: airway protection Intubation method: direct Patient status: unconscious Preoxygenation: ILMA/LMA Laryngoscope size: Mac 5 Tube size: 7.5 mm Tube type: cuffed Number of attempts: 1 Cricoid pressure: no Cords visualized: yes Post-procedure assessment: CO2 detector Breath sounds: equal and absent over the epigastrium Cuff inflated: yes ETT to lip: 24 cm Tube secured with: ETT holder Chest x-ray interpreted by radiologist. Chest x-ray findings: endotracheal tube in appropriate position Patient tolerance: Patient tolerated the procedure well with no immediate complications   (including critical care time)  DIAGNOSTIC STUDIES: Oxygen Saturation is 100% on RA, normal by my interpretation.    COORDINATION OF CARE: 11:40 PM-Discussed treatment plan which includes CXR, CBC, CMP, Lactic  Acid, INR   Labs Review Labs Reviewed  CBC - Abnormal; Notable for the following:    RBC 2.60 (*)    Hemoglobin 7.7 (*)    HCT 24.7 (*)    RDW 16.4 (*)    All other components within normal limits  I-STAT CG4 LACTIC ACID, ED - Abnormal; Notable for the following:    Lactic Acid, Venous 10.76 (*)    All other components within normal limits  I-STAT CHEM 8, ED - Abnormal; Notable for the following:    Potassium 5.3 (*)    Chloride 115 (*)    Creatinine, Ser 2.10 (*)    Glucose, Bld 191 (*)    Hemoglobin 8.8 (*)    HCT 26.0 (*)    All other components within normal limits  I-STAT ARTERIAL BLOOD GAS, ED - Abnormal; Notable for the following:    pH, Arterial 6.827 (*)    pCO2 arterial 57.1 (*)    pO2, Arterial 153.0 (*)    Bicarbonate 9.5 (*)    Acid-base deficit 23.0 (*)    All other components within normal limits  BLOOD GAS, ARTERIAL  APTT  BASIC METABOLIC PANEL  PROTIME-INR  TROPONIN I  URINALYSIS, ROUTINE W REFLEX MICROSCOPIC  CBG MONITORING, ED    Imaging Review Dg Chest Port 1 View  05/28/2014   CLINICAL DATA:  Post CPR  EXAM: PORTABLE CHEST - 1 VIEW  COMPARISON:  05/22/2014  FINDINGS: Endotracheal tube tip is approximately 1 cm above the carina. There are extensive airspace opacities in the central lung bilaterally. No large pneumothorax or effusion is evident. Left lateral costophrenic angle is excluded from the image.  IMPRESSION: ETT approximately 1 cm above the carina. Extensive parenchymal infiltrate, hemorrhage or edema in the central lung regions.   Electronically Signed   By: Andreas Newport M.D.   On: 05/28/2014 00:04     EKG Interpretation None      MDM   Final diagnoses:  None    I personally performed the services described in this documentation, which was scribed in my presence. The recorded information has been reviewed and is accurate.   Patient given IV fluids here and remained hypotensive. Was started on Levophed with good response and  blood pressure improved. Consult critical care obtained. Discussed patient's prognosis with family. Will be admitted to ICU  CRITICAL CARE Performed by:  Oliveah Zwack T Total critical care time: 60 Critical care time was exclusive of separately billable procedures and treating other patients. Critical care was necessary to treat or prevent imminent or life-threatening deterioration. Critical care was time spent personally by me on the following activities: development of treatment plan with patient and/or surrogate as well as nursing, discussions with consultants, evaluation of patient's response to treatment, examination of patient, obtaining history from patient or surrogate, ordering and performing treatments and interventions, ordering and review of laboratory studies, ordering and review of radiographic studies, pulse oximetry and re-evaluation of patient's condition.     Lacretia Leigh, MD 05/28/14 [phone]

## 2014-05-27 NOTE — ED Notes (Signed)
Pt in from home via EMS, found by family pulseless, CPR started 10 min from family, received 30 min of CPR from EMS, given calcium gluconate, and epi x5, PEA on the monitor until ROSC, king airway in place with assisted ventilations, pulses on arrival to ED, IO in place to right tibia, pt was released from the hospital today

## 2014-05-27 NOTE — Discharge Summary (Signed)
Physician Discharge Summary  Justin Gilmore KYH:[id number] DOB: [birth year] DOA: 05/21/2014  PCP: Robert Bellow, MD  Admit date: 05/21/2014 Discharge date: 05/31/2014  Time spent: >30 minutes  Recommendations for Outpatient Follow-up:  1. CBC to follow Hgb and platelets trend 2. BMET to follow electrolytes and renal function 3. 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 Discharge home with [facility] services. Patient will follow with PCP and surgical service as instructed.  Diet recommendation: low sodium diet  Filed Weights   05/21/14 2028  Weight: 78.9 kg (173 lb 15.1 oz)    History of present illness:  69 y.o. male with a Past Medical History of peripheral vascular disease status post right leg bypass in 2012, ongoing alcohol abuse, hypertension, prior history of DVT not on Coumadin who presents today with the above noted complaint. Patient claims that for the past week or so he has been much more weaker than usual, claims that his bilateral lower extremities have had significant swelling than his usual baseline. He also claims that he thinks that his right leg is not just holding up right, and has had problems ambulating with that particular leg. Per family/patient patient is very minimally ambulatory and only/barely walks around the house. He also complains that he has noticed a rash in his bilateral  lower extremities. Denies any pain. He denies any fever-but patient's girlfriend does claim to the patient's been having chills. Patient does not complain of shortness of breath at rest, however his family has noticed some mild dyspnea on exertion. Patient does claim that he has PND and gets up multiple times at night because of shortness of breath. He however denies orthopnea.  Hospital Course:  Fatty infiltration of the liver/small pseudocyst vs mass in the tail of the pancreas/Ascites/cirrhosis -Liver function normal -Lipase within normal limits -so far unsuccessful MRI abd/MRCP (patient unable to stay still during procedure) -Status post US paracentesis; no abnormalities isolated on ascitic fluid -patient has not been able to stay still for MRI of abdomen; will need further follow up as an outpatient  -will start spironolactone and lasix  . Acute on chronic renal failure stage III: Suspect acute renal failure likely prerenal azotemia -Cr trending down after IVF's -will minimize use of nephrotoxic agents -Pharmacy to help adjust any medications for renal function -since has remained stable will discharge on low dose spironolactone and lasix  . Lower extremity edema: Has significant hypoalbuminemia-suspect this may be from underlying undiagnosed cirrhosis given significant history of alcohol abuse.  -ABI with moderate arterial disease -will start low dose diuretics combination in am (spironolactone and lasix) -Venous Doppler negative -2-D echo: With preserved ejection fraction, no wall motion abnormality; normal cavity size and no hypertrophy appreciated.   . Perianal abscess:  -s/p I & D by [facility] -continue antibiotics by mouth and wound care -Continue wound care and supportive care   Pseudomonas UTI -base on sensitivity will switch antibiotics to cipro BID -plan is to  treat for a total of 7 days  . Anemia:  -combination of mild iron deficiency with AOCD and chronic alcohol  use -once infection controlled will need to be started on iron supplementation -continue B supplements -no transfusion needed during admission. -patient will need follow of Hgb trend during outpatient visit -stool guaiac negative  . Thrombocytopenia: Perhaps secondary to presumed liver cirrhosis and from heavy alcohol drinking -no signs of acute bleeding -follow platelets trend with CBC as an outpatient  . GERD (gastroesophageal reflux disease): Continue PPI   . Atherosclerosis of native arteries of extremity with intermittent claudication: Has had prior bypass surgery in his right lower extremities.  -Both lower extremities are significantly swollen, but appear warm to touch -ABI demonstrating moderate arterial disease; will need outpatient follow up with vascular surgery for decision on further treatment -advised to quit smoking  . Hypertension: BP is stable  -continue heart healthy diet -lasix and spironolactone initiated with good response from BP and kidney function -will need follow up of VS and BMET during outpatient visit  . ETOH abuse and tobacco abuse: cessation counseling provided,  -no withdrawal appreciated during admission -discharge with nicotine patch -presumed COPD; will need PFT's as an outpatient  . Anxiety: started on Buspar TID   Severe Malnutrition: dietitian consulted -patient advise to take OTC ensure TID   Diarrhea: from antibiotics most likely -will continue florastor -C. Diff neg -just mild intermittent loose stools at discharge  Physical deconditioning: will arrange for [facility] services for PT and OT. Patient also will receive HHRN for wound care.  Procedures:  2-D echo: - Left ventricle: The cavity size was normal. Systolic function was normal. The estimated ejection fraction was in the range of 50% to 55%. Wall motion was normal; there were no regional wall motion abnormalities. - Mitral valve: There was mild  regurgitation.   ABI: Demonstrated Moderate arterial disease bilaterally   Left perianal abscess I&D: 05/23/14   US guided Paracentesis:05/23/14   Consultations:  CCS  IR  Discharge Exam: Filed Vitals:   05/17/2014 0619  BP: 131/67  Pulse: 75  Temp: 98.1 F (36.7 C)  Resp: 16   General: No acute respiratory disstres; no fever. Feeling a lot better and complaining of just some anxiety. Per nursing reports positive loose stools. No dysuria Lungs: positive rhonchi, no wheezing; good air movement Cardiovascular: Regular rate and rhythm without murmur gallop or rub normal S1 and S2 Abdomen: Nontender, mild distension on exam, decrease positive wave signs. Positive bowel sounds positive Extremities: No cyanosis or clubbing; positive edema (2++ bilaterally) Skin: left buttock wound without to much drainage, clean edges and no erythema. No much pain according to patient   Discharge Instructions   Discharge Instructions    Diet - low sodium heart healthy    Complete by:  As directed      Discharge instructions    Complete by:  As directed   Follow a low sodium diet Take medications as prescribed Maintain good hydration and adequate nutrition Stop drinking alcohol and Stop Smoking Arrange follow up with PCP in 1 week Please follow with wound clinic at surgery office as instructed Follow [facility] services staff instruction to take care of yourself and improve your recovery time          Current Discharge Medication List    START taking these medications   Details  albuterol (VENTOLIN HFA) 108 (90 BASE) MCG/ACT inhaler Inhale 1 puff into the lungs every 6 (six) hours as needed for wheezing  or shortness of breath. Qty: 1 Inhaler, Refills: 1    busPIRone (BUSPAR) 7.5 MG tablet Take 1 tablet (7.5 mg total) by mouth 3 (three) times daily. Qty: 90 tablet, Refills: 0    calcium-vitamin D (OSCAL WITH D) 500-200 MG-UNIT per tablet Take 1 tablet by mouth daily with breakfast. Qty: 30  tablet, Refills: 1    ciprofloxacin (CIPRO) 500 MG tablet Take 1 tablet (500 mg total) by mouth 2 (two) times daily. Qty: 10 tablet, Refills: 0    doxycycline (VIBRA-TABS) 100 MG tablet Take 1 tablet (100 mg total) by mouth every 12 (twelve) hours. Qty: 10 tablet, Refills: 0    furosemide (LASIX) 20 MG tablet Take 1 tablet (20 mg total) by mouth daily. Qty: 30 tablet, Refills: 1    Multiple Vitamin (MULTIVITAMIN WITH MINERALS) TABS tablet Take 1 tablet by mouth daily.    nicotine (NICODERM CQ) 21 mg/24hr patch Place 1 patch (21 mg total) onto the skin daily. Qty: 28 patch, Refills: 0    saccharomyces boulardii (FLORASTOR) 250 MG capsule Take 1 capsule (250 mg total) by mouth 2 (two) times daily. Qty: 60 capsule, Refills: 0    spironolactone (ALDACTONE) 25 MG tablet Take 0.5 tablets (12.5 mg total) by mouth 2 (two) times daily. Qty: 60 tablet, Refills: 1      CONTINUE these medications which have CHANGED   Details  folic acid (FOLVITE) 1 MG tablet Take 1 tablet (1 mg total) by mouth daily. Qty: 30 tablet, Refills: 2    metoprolol (LOPRESSOR) 25 MG tablet Take 0.5 tablets (12.5 mg total) by mouth 2 (two) times daily. Qty: 60 tablet, Refills: 1      CONTINUE these medications which have NOT CHANGED   Details  aspirin 81 MG chewable tablet Chew 81 mg by mouth daily.    B Complex-C (SUPER B COMPLEX PO) Take by mouth daily.    chlorproMAZINE (THORAZINE) 10 MG tablet Take 10 mg by mouth 4 (four) times daily as needed for hiccoughs.     HYDROcodone-acetaminophen (NORCO/VICODIN) 5-325 MG per tablet Take 1 tablet by mouth every 6 (six) hours as needed for moderate pain.     pantoprazole (PROTONIX) 40 MG tablet TAKE ONE TABLET BY MOUTH EVERY DAY Qty: 30 tablet, Refills: 11      STOP taking these medications     amLODipine (NORVASC) 10 MG tablet      loperamide (IMODIUM A-D) 2 MG tablet        No Known Allergies Follow-up Information    Follow up with [facility] On 06/12/2014.   Why:  arrive by 3:15pm for a 3:45PM , For wound re-check   Contact information:   [place] [address] [phone]      Follow up with Robert Bellow, MD. Schedule an appointment as soon as possible for a visit in 1 week.   Specialty:  Family Medicine   Contact information:   [place] [address] [phone]       The results of significant diagnostics from this hospitalization (including imaging, microbiology, ancillary and laboratory) are listed below for reference.    Significant Diagnostic Studies: Ct Abdomen Pelvis Wo Contrast  05/21/2014   CLINICAL DATA:  Right lower extremity pain and swelling. Hypotension. Decreased hemoglobin.  EXAM: CT ABDOMEN AND PELVIS WITHOUT CONTRAST  TECHNIQUE: Multidetector CT imaging of the abdomen and pelvis was performed following the standard protocol without IV contrast.  COMPARISON:  CT  abdomen and pelvis 05/12/2006.  FINDINGS: The patient has small bilateral pleural effusions. No pericardial effusion is noted. Calcific coronary artery disease is seen.  There is marked fatty infiltration of the liver diffusely. The gallbladder and right adrenal gland are unremarkable. The tail of the pancreas appears expanded and is contiguous with the upper pole the left kidney and splenic hilum. There may be a mass in the pancreatic tail. Additionally, anterior and lateral to the neck of the pancreas, there is a low attenuating focus measuring 2.3 cm AP x 2.7 cm transverse on image 36 with Hounsfield unit of measurements of 10.9. A low attenuating left adrenal nodule consistent with an adenoma is unchanged. A hyper attenuating lesion the midpole left kidney measures 1.4 cm in diameter and likely represents a complex cyst but cannot be definitively characterized.  Along the descending duodenum, the patient appears to have a duodenum diverticulum. The small bowel is otherwise  unremarkable. The walls of the colon are diffusely thickened. While this could be secondary to colitis, this may be related to the patient's hepatic insufficiency as changes are most notable in the ascending and proximal transverse colon. A small volume of upper abdominal ascites is identified. Extensive aortoiliac atherosclerosis is noted.  In the left buttock, there is partial visualization of a lesion measuring 4.4 cm transverse by 3.2 cm AP on image 94. Diffuse body wall edema is noted. No lytic or sclerotic bony lesion is seen.  IMPRESSION: Diffuse and marked fatty infiltration of the liver. Small volume of perisplenic ascites is likely related to hepatic insufficiency.  Expanded tail of the pancreas which is contiguous with the left kidney and splenic hilum is not included on prior studies. Mass in the pancreatic tail is possible. A low attenuating lesion off the neck of the pancreas anterior and laterally may be a pancreatic pseudocyst but cannot be definitively characterized. MRI of the abdomen with contrast is recommended. MRI should be deferred until the patient is able to follow breath holding instructions. This could be performed as an outpatient.  Wall thickening of the colon most notable in the ascending and transverse is likely related to hepatic insufficiency although colitis could create a similar appearance.  Low attenuating lesion in the left buttock is worrisome for abscess.  Anasarca.  Extensive atherosclerosis.   Electronically Signed   By: Inge Rise M.D.   On: 05/21/2014 19:25   Dg Chest 1 View  05/21/2014   CLINICAL DATA:  Bilateral leg swelling and shortness of breath.  EXAM: CHEST  1 VIEW  COMPARISON:  02/07/2014  FINDINGS: Artifact overlies chest. The patient has taken a poor inspiration. Heart size is at the upper limits of normal. Mediastinal shadows are normal. There is probably pulmonary venous hypertension but there is no frank edema. No effusions. No collapse or infiltrate.   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  No frank edema.   Electronically Signed   By: Nelson Chimes M.D.   On: 05/21/2014 15:29   Dg Pelvis 1-2 Views  05/22/2014   CLINICAL DATA:  Right buttock abscess.  EXAM: PELVIS - 1-2 VIEW  COMPARISON:  05/21/2014  FINDINGS: Oral contrast material demonstrated within the descending and sigmoid colon. Lower lumbar spine degenerative change. Osseous pelvis is unremarkable. Vascular calcifications.  IMPRESSION: Lumbar spine degenerative changes.  Osseous skeleton are unremarkable.   Electronically Signed   By: Lovey Newcomer M.D.   On: 05/22/2014 10:23   Mr Hip Right Wo Contrast  05/22/2014   CLINICAL DATA:  Right  hip pain. History of perianal abscess, alcoholic cirrhosis and renal failure. Evaluate for avascular necrosis. Initial encounter.  EXAM: MR OF THE RIGHT HIP WITHOUT CONTRAST  TECHNIQUE: Multiplanar, multisequence MR imaging was performed. No intravenous contrast was administered.  COMPARISON:  Pelvic radiographs 05/2014.  Pelvic CT 05/21/2014.  FINDINGS: Study is mildly motion degraded. Image quality is also degraded by diffuse soft tissue edema.  Bones: Both femoral heads appear normal without evidence of acute fracture, dislocation or avascular necrosis. No focal or acute abnormalities are identified within the bony pelvis. The bone marrow is diffusely heterogeneous with mottled T1 hypo intensities, most pronounced within the lumbar spine. There is no suspicious marrow lesion on inversion recovery imaging, and this is probably due to osteoporosis. There is no evidence of sacroiliitis.  Articular cartilage and labrum  Articular cartilage: No focal chondral defect or subchondral signal abnormality identified.  Labrum: There is no gross labral tear or paralabral abnormality.  Joint or bursal effusion  Joint effusion: Small bilateral hip joint effusions.  Bursae: No focal periarticular fluid collections identified. However, there is extensive subcutaneous  edema throughout the pelvis and proximal thighs. There is also mild muscular edema bilaterally.  Muscles and tendons  Muscles and tendons: The gluteus, hamstring and iliopsoas tendons appear intact. The piriformis muscles are symmetric. As above, there is mild diffuse muscular edema throughout the pelvis.  Other findings  Miscellaneous: The previously demonstrated left perianal subcutaneous fluid collection is incompletely visualized by the current examination, although grossly stable. There is mild pelvic ascites which appears new. 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  IMPRESSION: 1. No evidence of femoral head avascular necrosis, osteomyelitis or acute osseous findings. 2. Progressive anasarca with generalized soft tissue edema, pelvic ascites and small bilateral hip joint effusions. No new focal fluid collections identified. The left perianal fluid collection is grossly stable, although incompletely visualized.   Electronically Signed   By: Richardean Sale M.D.   On: 05/22/2014 14:19   US Paracentesis  05/24/2014   CLINICAL DATA:  Ascites  EXAM: ULTRASOUND GUIDED PARACENTESIS  TECHNIQUE: Survey ultrasound of the abdomen was performed and an appropriate skin entry site in the right lower abdomen was selected. Skin site was marked, prepped with Betadine, and draped in usual sterile fashion, and infiltrated locally with 1% lidocaine. A 5 French multisidehole Yueh sheath needle was advanced into the peritoneal space until fluid could be aspirated. The sheath was advanced and the needle removed. 400 cc of clearascites were aspirated.  COMPLICATIONS: None  IMPRESSION: Technically successful ultrasound guided paracentesis, removing 400 cc of ascites.   Electronically Signed   By: Marybelle Killings M.D.   On: 05/24/2014 08:22   Dg Chest Port 1 View  05/22/2014   CLINICAL DATA:  Shortness of breath.  EXAM: PORTABLE CHEST - 1 VIEW  COMPARISON:  Chest radiograph 05/21/2014  FINDINGS:  Monitoring leads overlie the patient. Stable cardiac and mediastinal contours. No consolidative pulmonary opacities. No pleural effusion or pneumothorax. Regional skeleton is unremarkable.  IMPRESSION: No acute cardiopulmonary process.   Electronically Signed   By: Lovey Newcomer M.D.   On: 05/22/2014 09:57    Microbiology: Recent Results (from the past 240 hour(s))  Blood culture (routine x 2)     Status: None (Preliminary result)   Collection Time: 05/21/14  4:18 PM  Result Value Ref Range Status   Specimen Description BLOOD HAND LEFT  Final   Special Requests BOTTLES DRAWN AEROBIC AND ANAEROBIC 5CC  Final  Culture   Final           BLOOD CULTURE RECEIVED NO GROWTH TO DATE CULTURE WILL BE HELD FOR 5 DAYS BEFORE ISSUING A FINAL NEGATIVE REPORT Performed at [facility]    Report Status PENDING  Incomplete  Blood culture (routine x 2)     Status: None (Preliminary result)   Collection Time: 05/21/14  4:27 PM  Result Value Ref Range Status   Specimen Description BLOOD HAND RIGHT  Final   Special Requests BOTTLES DRAWN AEROBIC AND ANAEROBIC 3CC  Final   Culture   Final           BLOOD CULTURE RECEIVED NO GROWTH TO DATE CULTURE WILL BE HELD FOR 5 DAYS BEFORE ISSUING A FINAL NEGATIVE REPORT Performed at [facility]    Report Status PENDING  Incomplete  Urine culture     Status: None   Collection Time: 05/21/14  5:09 PM  Result Value Ref Range Status   Specimen Description URINE, CLEAN CATCH  Final   Special Requests NONE  Final   Colony Count   Final    >=100,000 COLONIES/ML Performed at [facility]    Culture   Final    PSEUDOMONAS AERUGINOSA Performed at [facility]    Report Status 05/25/2014 FINAL  Final   Organism ID, Bacteria PSEUDOMONAS AERUGINOSA  Final      Susceptibility   Pseudomonas aeruginosa - MIC*    CEFEPIME 4 SENSITIVE Sensitive     CEFTAZIDIME 4 SENSITIVE Sensitive     CIPROFLOXACIN <=0.25 SENSITIVE Sensitive     GENTAMICIN 2  SENSITIVE Sensitive     IMIPENEM 2 SENSITIVE Sensitive     PIP/TAZO 16 SENSITIVE Sensitive     TOBRAMYCIN <=1 SENSITIVE Sensitive     * PSEUDOMONAS AERUGINOSA  MRSA PCR Screening     Status: None   Collection Time: 05/21/14  9:43 PM  Result Value Ref Range Status   MRSA by PCR NEGATIVE NEGATIVE Final    Comment:        The GeneXpert MRSA Assay (FDA approved for NASAL specimens only), is one component of a comprehensive MRSA colonization surveillance program. It is not intended to diagnose MRSA infection nor to guide or monitor treatment for MRSA infections.   Clostridium Difficile by PCR     Status: None   Collection Time: 05/22/14 12:33 PM  Result Value Ref Range Status   C difficile by pcr NEGATIVE NEGATIVE Final  Body fluid culture     Status: None (Preliminary result)   Collection Time: 05/23/14  4:40 PM  Result Value Ref Range Status   Specimen Description ASCITIC ABDOMEN  Final   Special Requests NONE  Final   Gram Stain   Final    RARE WBC PRESENT, PREDOMINANTLY PMN NO ORGANISMS SEEN Performed at [facility]    Culture   Final    NO GROWTH 2 DAYS Performed at [facility]    Report Status PENDING  Incomplete     Labs: Basic Metabolic Panel:  Recent Labs Lab 05/22/14 0347 05/23/14 0425 05/24/14 0519 05/26/14 0546 06/08/2014 0610  NA 140 141 140 140 140  K 4.5 3.5 3.4* 4.3 4.3  CL 116* 114* 115* 116* 114*  CO2 21 20 19  16* 19  GLUCOSE 87 81 94 90 109*  BUN 16 11 11 8 7   CREATININE 2.00* 1.93* 2.00* 1.91* 1.98*  CALCIUM 6.5* 6.5* 6.3* 6.8* 6.9*   Liver Function Tests:  Recent Labs Lab 05/21/14 1345 05/22/14 0347 05/23/14 0425  AST 43* 51* 53*  ALT 38 35 39  ALKPHOS 150* 143* 134*  BILITOT 1.2 1.2 1.4*  PROT 4.0* 3.9* 3.8*  ALBUMIN 1.2* 1.1* 1.0*    Recent Labs Lab 05/22/14 0347  LIPASE 13   CBC:  Recent Labs Lab 05/21/14 1345 05/22/14 0347 05/23/14 0425 05/24/14 0519  WBC 5.7 4.9 3.4* 3.5*  NEUTROABS 4.4  --    --   --   HGB 8.9* 8.6* 8.0* 7.8*  HCT 26.9* 25.7* 23.3* 23.3*  MCV 90.9 87.7 86.6 87.6  PLT 116* 112* 101* 113*    BNP (last 3 results)  Recent Labs  05/21/14 1759  BNP 701.4*    ProBNP (last 3 results) No results for input(s): PROBNP in the last 8760 hours.   Signed:  Barton Dubois  [facility] 05/28/2014, 9:19 AM

## 2014-05-28 ENCOUNTER — Inpatient Hospital Stay (HOSPITAL_COMMUNITY): Payer: Medicare Other

## 2014-05-28 ENCOUNTER — Emergency Department (HOSPITAL_COMMUNITY): Payer: Medicare Other

## 2014-05-28 ENCOUNTER — Encounter (HOSPITAL_COMMUNITY): Payer: Self-pay | Admitting: *Deleted

## 2014-05-28 DIAGNOSIS — Z66 Do not resuscitate: Secondary | ICD-10-CM | POA: Diagnosis present

## 2014-05-28 DIAGNOSIS — R7989 Other specified abnormal findings of blood chemistry: Secondary | ICD-10-CM | POA: Diagnosis not present

## 2014-05-28 DIAGNOSIS — I739 Peripheral vascular disease, unspecified: Secondary | ICD-10-CM | POA: Diagnosis present

## 2014-05-28 DIAGNOSIS — K7031 Alcoholic cirrhosis of liver with ascites: Secondary | ICD-10-CM | POA: Diagnosis present

## 2014-05-28 DIAGNOSIS — G931 Anoxic brain damage, not elsewhere classified: Secondary | ICD-10-CM | POA: Diagnosis present

## 2014-05-28 DIAGNOSIS — F1010 Alcohol abuse, uncomplicated: Secondary | ICD-10-CM | POA: Diagnosis not present

## 2014-05-28 DIAGNOSIS — J9692 Respiratory failure, unspecified with hypercapnia: Secondary | ICD-10-CM | POA: Diagnosis present

## 2014-05-28 DIAGNOSIS — I10 Essential (primary) hypertension: Secondary | ICD-10-CM | POA: Diagnosis not present

## 2014-05-28 DIAGNOSIS — I469 Cardiac arrest, cause unspecified: Secondary | ICD-10-CM

## 2014-05-28 DIAGNOSIS — N184 Chronic kidney disease, stage 4 (severe): Secondary | ICD-10-CM | POA: Diagnosis present

## 2014-05-28 DIAGNOSIS — Z79891 Long term (current) use of opiate analgesic: Secondary | ICD-10-CM | POA: Diagnosis not present

## 2014-05-28 DIAGNOSIS — N179 Acute kidney failure, unspecified: Secondary | ICD-10-CM | POA: Diagnosis present

## 2014-05-28 DIAGNOSIS — J9601 Acute respiratory failure with hypoxia: Secondary | ICD-10-CM | POA: Diagnosis present

## 2014-05-28 DIAGNOSIS — I129 Hypertensive chronic kidney disease with stage 1 through stage 4 chronic kidney disease, or unspecified chronic kidney disease: Secondary | ICD-10-CM | POA: Diagnosis present

## 2014-05-28 DIAGNOSIS — G936 Cerebral edema: Secondary | ICD-10-CM | POA: Diagnosis present

## 2014-05-28 DIAGNOSIS — N390 Urinary tract infection, site not specified: Secondary | ICD-10-CM | POA: Diagnosis present

## 2014-05-28 DIAGNOSIS — Z7982 Long term (current) use of aspirin: Secondary | ICD-10-CM | POA: Diagnosis not present

## 2014-05-28 DIAGNOSIS — I4891 Unspecified atrial fibrillation: Secondary | ICD-10-CM | POA: Diagnosis not present

## 2014-05-28 DIAGNOSIS — K219 Gastro-esophageal reflux disease without esophagitis: Secondary | ICD-10-CM | POA: Diagnosis present

## 2014-05-28 DIAGNOSIS — I639 Cerebral infarction, unspecified: Secondary | ICD-10-CM | POA: Diagnosis present

## 2014-05-28 DIAGNOSIS — F17210 Nicotine dependence, cigarettes, uncomplicated: Secondary | ICD-10-CM | POA: Diagnosis present

## 2014-05-28 DIAGNOSIS — R778 Other specified abnormalities of plasma proteins: Secondary | ICD-10-CM | POA: Insufficient documentation

## 2014-05-28 DIAGNOSIS — Z8249 Family history of ischemic heart disease and other diseases of the circulatory system: Secondary | ICD-10-CM | POA: Diagnosis not present

## 2014-05-28 DIAGNOSIS — J449 Chronic obstructive pulmonary disease, unspecified: Secondary | ICD-10-CM | POA: Diagnosis present

## 2014-05-28 DIAGNOSIS — Z79899 Other long term (current) drug therapy: Secondary | ICD-10-CM | POA: Diagnosis not present

## 2014-05-28 DIAGNOSIS — D649 Anemia, unspecified: Secondary | ICD-10-CM | POA: Diagnosis present

## 2014-05-28 DIAGNOSIS — K210 Gastro-esophageal reflux disease with esophagitis: Secondary | ICD-10-CM | POA: Diagnosis present

## 2014-05-28 DIAGNOSIS — R748 Abnormal levels of other serum enzymes: Secondary | ICD-10-CM | POA: Diagnosis present

## 2014-05-28 LAB — BASIC METABOLIC PANEL
ANION GAP: 9 (ref 5–15)
Anion gap: 10 (ref 5–15)
Anion gap: 10 (ref 5–15)
Anion gap: 15 (ref 5–15)
Anion gap: 9 (ref 5–15)
BUN: 6 mg/dL (ref 6–23)
BUN: 7 mg/dL (ref 6–23)
BUN: 7 mg/dL (ref 6–23)
BUN: 8 mg/dL (ref 6–23)
BUN: 8 mg/dL (ref 6–23)
CALCIUM: 6.9 mg/dL — AB (ref 8.4–10.5)
CALCIUM: 7.1 mg/dL — AB (ref 8.4–10.5)
CALCIUM: 7.8 mg/dL — AB (ref 8.4–10.5)
CO2: 11 mmol/L — AB (ref 19–32)
CO2: 14 mmol/L — ABNORMAL LOW (ref 19–32)
CO2: 14 mmol/L — ABNORMAL LOW (ref 19–32)
CO2: 15 mmol/L — AB (ref 19–32)
CO2: 17 mmol/L — ABNORMAL LOW (ref 19–32)
CREATININE: 2.38 mg/dL — AB (ref 0.50–1.35)
Calcium: 7 mg/dL — ABNORMAL LOW (ref 8.4–10.5)
Calcium: 6.9 mg/dL — ABNORMAL LOW (ref 8.4–10.5)
Chloride: 113 mmol/L — ABNORMAL HIGH (ref 96–112)
Chloride: 115 mmol/L — ABNORMAL HIGH (ref 96–112)
Chloride: 116 mmol/L — ABNORMAL HIGH (ref 96–112)
Chloride: 116 mmol/L — ABNORMAL HIGH (ref 96–112)
Chloride: 117 mmol/L — ABNORMAL HIGH (ref 96–112)
Creatinine, Ser: 2.29 mg/dL — ABNORMAL HIGH (ref 0.50–1.35)
Creatinine, Ser: 2.31 mg/dL — ABNORMAL HIGH (ref 0.50–1.35)
Creatinine, Ser: 2.36 mg/dL — ABNORMAL HIGH (ref 0.50–1.35)
Creatinine, Ser: 2.47 mg/dL — ABNORMAL HIGH (ref 0.50–1.35)
GFR calc Af Amer: 29 mL/min — ABNORMAL LOW (ref >90)
GFR calc Af Amer: 31 mL/min — ABNORMAL LOW (ref >90)
GFR calc Af Amer: 31 mL/min — ABNORMAL LOW (ref >90)
GFR calc Af Amer: 32 mL/min — ABNORMAL LOW (ref >90)
GFR calc non Af Amer: 25 mL/min — ABNORMAL LOW (ref >90)
GFR calc non Af Amer: 27 mL/min — ABNORMAL LOW (ref >90)
GFR calc non Af Amer: 28 mL/min — ABNORMAL LOW (ref >90)
GFR, EST AFRICAN AMERICAN: 32 mL/min — AB (ref >90)
GFR, EST NON AFRICAN AMERICAN: 28 mL/min — AB (ref >90)
GFR, EST NON AFRICAN AMERICAN: 27 mL/min — AB (ref >90)
GLUCOSE: 168 mg/dL — AB (ref 70–99)
GLUCOSE: 205 mg/dL — AB (ref 70–99)
GLUCOSE: 247 mg/dL — AB (ref 70–99)
Glucose, Bld: 243 mg/dL — ABNORMAL HIGH (ref 70–99)
Glucose, Bld: 241 mg/dL — ABNORMAL HIGH (ref 70–99)
POTASSIUM: 4.3 mmol/L (ref 3.5–5.1)
Potassium: 3.9 mmol/L (ref 3.5–5.1)
Potassium: 4.1 mmol/L (ref 3.5–5.1)
Potassium: 4.4 mmol/L (ref 3.5–5.1)
Potassium: 5.4 mmol/L — ABNORMAL HIGH (ref 3.5–5.1)
SODIUM: 140 mmol/L (ref 135–145)
Sodium: 138 mmol/L (ref 135–145)
Sodium: 140 mmol/L (ref 135–145)
Sodium: 141 mmol/L (ref 135–145)
Sodium: 142 mmol/L (ref 135–145)

## 2014-05-28 LAB — URINE MICROSCOPIC-ADD ON

## 2014-05-28 LAB — I-STAT ARTERIAL BLOOD GAS, ED
ACID-BASE DEFICIT: 23 mmol/L — AB (ref 0.0–2.0)
Acid-base deficit: 17 mmol/L — ABNORMAL HIGH (ref 0.0–2.0)
BICARBONATE: 9.5 meq/L — AB (ref 20.0–24.0)
Bicarbonate: 9.8 mEq/L — ABNORMAL LOW (ref 20.0–24.0)
O2 SAT: 97 %
O2 Saturation: 99 %
PCO2 ART: 23.7 mmHg — AB (ref 35.0–45.0)
PCO2 ART: 57.1 mmHg — AB (ref 35.0–45.0)
PO2 ART: 153 mmHg — AB (ref 80.0–100.0)
Patient temperature: 97.9
TCO2: 10 mmol/L (ref 0–100)
TCO2: 11 mmol/L (ref 0–100)
pH, Arterial: 6.827 — CL (ref 7.350–7.450)
pH, Arterial: 7.219 — ABNORMAL LOW (ref 7.350–7.450)
pO2, Arterial: 182 mmHg — ABNORMAL HIGH (ref 80.0–100.0)

## 2014-05-28 LAB — URINALYSIS, ROUTINE W REFLEX MICROSCOPIC
Glucose, UA: NEGATIVE mg/dL
Ketones, ur: NEGATIVE mg/dL
Nitrite: NEGATIVE
PH: 5.5 (ref 5.0–8.0)
Protein, ur: 30 mg/dL — AB
UROBILINOGEN UA: 0.2 mg/dL (ref 0.0–1.0)

## 2014-05-28 LAB — POCT I-STAT, CHEM 8
BUN: 6 mg/dL (ref 6–23)
BUN: 6 mg/dL (ref 6–23)
BUN: 6 mg/dL (ref 6–23)
CALCIUM ION: 1.05 mmol/L — AB (ref 1.13–1.30)
CALCIUM ION: 1.08 mmol/L — AB (ref 1.13–1.30)
CHLORIDE: 111 mmol/L (ref 96–112)
Calcium, Ion: 1.04 mmol/L — ABNORMAL LOW (ref 1.13–1.30)
Chloride: 116 mmol/L — ABNORMAL HIGH (ref 96–112)
Chloride: 115 mmol/L — ABNORMAL HIGH (ref 96–112)
Creatinine, Ser: 2.2 mg/dL — ABNORMAL HIGH (ref 0.50–1.35)
Creatinine, Ser: 2.2 mg/dL — ABNORMAL HIGH (ref 0.50–1.35)
Creatinine, Ser: 2.3 mg/dL — ABNORMAL HIGH (ref 0.50–1.35)
GLUCOSE: 135 mg/dL — AB (ref 70–99)
GLUCOSE: 193 mg/dL — AB (ref 70–99)
Glucose, Bld: 209 mg/dL — ABNORMAL HIGH (ref 70–99)
HCT: 31 % — ABNORMAL LOW (ref 39.0–52.0)
HCT: 30 % — ABNORMAL LOW (ref 39.0–52.0)
HEMATOCRIT: 31 % — AB (ref 39.0–52.0)
HEMOGLOBIN: 10.5 g/dL — AB (ref 13.0–17.0)
HEMOGLOBIN: 10.5 g/dL — AB (ref 13.0–17.0)
Hemoglobin: 10.2 g/dL — ABNORMAL LOW (ref 13.0–17.0)
Potassium: 4.8 mmol/L (ref 3.5–5.1)
Potassium: 4.4 mmol/L (ref 3.5–5.1)
Potassium: 3.9 mmol/L (ref 3.5–5.1)
SODIUM: 144 mmol/L (ref 135–145)
SODIUM: 143 mmol/L (ref 135–145)
Sodium: 144 mmol/L (ref 135–145)
TCO2: 9 mmol/L (ref 0–100)
TCO2: 10 mmol/L (ref 0–100)
TCO2: 14 mmol/L (ref 0–100)

## 2014-05-28 LAB — POCT I-STAT 3, ART BLOOD GAS (G3+)
Acid-base deficit: 16 mmol/L — ABNORMAL HIGH (ref 0.0–2.0)
BICARBONATE: 8.9 meq/L — AB (ref 20.0–24.0)
O2 SAT: 97 %
PH ART: 7.309 — AB (ref 7.350–7.450)
Patient temperature: 34.4
TCO2: 10 mmol/L (ref 0–100)
pCO2 arterial: 17.2 mmHg — CL (ref 35.0–45.0)
pO2, Arterial: 82 mmHg (ref 80.0–100.0)

## 2014-05-28 LAB — CBC
HCT: 24.7 % — ABNORMAL LOW (ref 39.0–52.0)
HEMATOCRIT: 27.1 % — AB (ref 39.0–52.0)
HEMOGLOBIN: 8.8 g/dL — AB (ref 13.0–17.0)
Hemoglobin: 7.7 g/dL — ABNORMAL LOW (ref 13.0–17.0)
MCH: 29.6 pg (ref 26.0–34.0)
MCH: 30.2 pg (ref 26.0–34.0)
MCHC: 31.2 g/dL (ref 30.0–36.0)
MCHC: 32.5 g/dL (ref 30.0–36.0)
MCV: 93.1 fL (ref 78.0–100.0)
MCV: 95 fL (ref 78.0–100.0)
PLATELETS: 181 10*3/uL (ref 150–400)
Platelets: 158 10*3/uL (ref 150–400)
RBC: 2.6 MIL/uL — ABNORMAL LOW (ref 4.22–5.81)
RBC: 2.91 MIL/uL — ABNORMAL LOW (ref 4.22–5.81)
RDW: 15.7 % — AB (ref 11.5–15.5)
RDW: 16.4 % — AB (ref 11.5–15.5)
WBC: 13.8 10*3/uL — AB (ref 4.0–10.5)
WBC: 9.1 10*3/uL (ref 4.0–10.5)

## 2014-05-28 LAB — GLUCOSE, CAPILLARY
GLUCOSE-CAPILLARY: 192 mg/dL — AB (ref 70–99)
GLUCOSE-CAPILLARY: 227 mg/dL — AB (ref 70–99)
GLUCOSE-CAPILLARY: 233 mg/dL — AB (ref 70–99)
GLUCOSE-CAPILLARY: 235 mg/dL — AB (ref 70–99)
GLUCOSE-CAPILLARY: 208 mg/dL — AB (ref 70–99)
GLUCOSE-CAPILLARY: 241 mg/dL — AB (ref 70–99)
GLUCOSE-CAPILLARY: 167 mg/dL — AB (ref 70–99)
GLUCOSE-CAPILLARY: 148 mg/dL — AB (ref 70–99)
GLUCOSE-CAPILLARY: 145 mg/dL — AB (ref 70–99)
Glucose-Capillary: 149 mg/dL — ABNORMAL HIGH (ref 70–99)
Glucose-Capillary: 136 mg/dL — ABNORMAL HIGH (ref 70–99)
Glucose-Capillary: 230 mg/dL — ABNORMAL HIGH (ref 70–99)
Glucose-Capillary: 253 mg/dL — ABNORMAL HIGH (ref 70–99)
Glucose-Capillary: 217 mg/dL — ABNORMAL HIGH (ref 70–99)
Glucose-Capillary: 248 mg/dL — ABNORMAL HIGH (ref 70–99)
Glucose-Capillary: 267 mg/dL — ABNORMAL HIGH (ref 70–99)
Glucose-Capillary: 203 mg/dL — ABNORMAL HIGH (ref 70–99)
Glucose-Capillary: 173 mg/dL — ABNORMAL HIGH (ref 70–99)

## 2014-05-28 LAB — CULTURE, BLOOD (ROUTINE X 2)
CULTURE: NO GROWTH
Culture: NO GROWTH

## 2014-05-28 LAB — COMPREHENSIVE METABOLIC PANEL
ALBUMIN: 1.1 g/dL — AB (ref 3.5–5.2)
ALT: 57 U/L — ABNORMAL HIGH (ref 0–53)
AST: 146 U/L — ABNORMAL HIGH (ref 0–37)
Alkaline Phosphatase: 230 U/L — ABNORMAL HIGH (ref 39–117)
Anion gap: 11 (ref 5–15)
BILIRUBIN TOTAL: 1.4 mg/dL — AB (ref 0.3–1.2)
BUN: 8 mg/dL (ref 6–23)
CHLORIDE: 119 mmol/L — AB (ref 96–112)
CO2: 12 mmol/L — AB (ref 19–32)
CREATININE: 2.4 mg/dL — AB (ref 0.50–1.35)
Calcium: 7 mg/dL — ABNORMAL LOW (ref 8.4–10.5)
GFR calc Af Amer: 31 mL/min — ABNORMAL LOW (ref >90)
GFR, EST NON AFRICAN AMERICAN: 26 mL/min — AB (ref >90)
Glucose, Bld: 137 mg/dL — ABNORMAL HIGH (ref 70–99)
Potassium: 4.9 mmol/L (ref 3.5–5.1)
Sodium: 142 mmol/L (ref 135–145)
Total Protein: 4.8 g/dL — ABNORMAL LOW (ref 6.0–8.3)

## 2014-05-28 LAB — MAGNESIUM
Magnesium: 1.3 mg/dL — ABNORMAL LOW (ref 1.5–2.5)
Magnesium: 1.3 mg/dL — ABNORMAL LOW (ref 1.5–2.5)

## 2014-05-28 LAB — AMYLASE: Amylase: 15 U/L (ref 0–105)

## 2014-05-28 LAB — PHOSPHORUS
PHOSPHORUS: 6.6 mg/dL — AB (ref 2.3–4.6)
Phosphorus: 6.1 mg/dL — ABNORMAL HIGH (ref 2.3–4.6)

## 2014-05-28 LAB — PROTIME-INR
INR: 1.54 — AB (ref 0.00–1.49)
INR: 2.04 — AB (ref 0.00–1.49)
PROTHROMBIN TIME: 23.2 s — AB (ref 11.6–15.2)
Prothrombin Time: 18.7 seconds — ABNORMAL HIGH (ref 11.6–15.2)

## 2014-05-28 LAB — LIPASE, BLOOD: Lipase: 15 U/L (ref 11–59)

## 2014-05-28 LAB — MRSA PCR SCREENING: MRSA by PCR: NEGATIVE

## 2014-05-28 LAB — TROPONIN I
TROPONIN I: 0.58 ng/mL — AB (ref <0.031)
TROPONIN I: 0.58 ng/mL — AB (ref <0.031)
Troponin I: 0.05 ng/mL — ABNORMAL HIGH (ref <0.031)
Troponin I: 0.27 ng/mL — ABNORMAL HIGH (ref <0.031)

## 2014-05-28 LAB — LACTIC ACID, PLASMA: LACTIC ACID, VENOUS: 7.6 mmol/L — AB (ref 0.5–2.0)

## 2014-05-28 LAB — APTT
APTT: 46 s — AB (ref 24–37)
aPTT: 87 seconds — ABNORMAL HIGH (ref 24–37)

## 2014-05-28 MED ORDER — ONDANSETRON HCL 4 MG/2ML IJ SOLN: 4.0000 mg | Freq: Four times a day (QID) | INTRAMUSCULAR | Status: DC | PRN

## 2014-05-28 MED ORDER — MIDAZOLAM BOLUS VIA INFUSION
1.0000 mg | INTRAVENOUS | Status: DC | PRN
  Filled 2014-05-28: qty 1

## 2014-05-28 MED ORDER — SODIUM CHLORIDE 0.9 % IV SOLN
25.0000 ug/h | INTRAVENOUS | Status: DC
  Administered 2014-05-28: 200 ug/h via INTRAVENOUS
  Administered 2014-05-28: 100 ug/h via INTRAVENOUS
  Administered 2014-05-29: 200 ug/h via INTRAVENOUS
  Filled 2014-05-28 (×3): qty 50

## 2014-05-28 MED ORDER — FENTANYL CITRATE 0.05 MG/ML IJ SOLN
50.0000 ug | Freq: Once | INTRAMUSCULAR | Status: AC
  Administered 2014-05-28: 50 ug via INTRAVENOUS

## 2014-05-28 MED ORDER — SODIUM CHLORIDE 0.9 % IV SOLN: 250.0000 mL | INTRAVENOUS | Status: DC | PRN

## 2014-05-28 MED ORDER — ASPIRIN 81 MG PO CHEW: 81.0000 mg | CHEWABLE_TABLET | Freq: Every day | ORAL | Status: DC

## 2014-05-28 MED ORDER — SODIUM CHLORIDE 0.9 % IV SOLN
1.0000 g | Freq: Once | INTRAVENOUS | Status: AC
  Administered 2014-05-28: 1 g via INTRAVENOUS
  Filled 2014-05-28: qty 10

## 2014-05-28 MED ORDER — PANTOPRAZOLE SODIUM 40 MG IV SOLR
40.0000 mg | Freq: Every day | INTRAVENOUS | Status: DC
  Administered 2014-05-28 – 2014-05-29 (×3): 40 mg via INTRAVENOUS
  Filled 2014-05-28 (×5): qty 40

## 2014-05-28 MED ORDER — SODIUM BICARBONATE 8.4 % IV SOLN
INTRAVENOUS | Status: DC
  Administered 2014-05-28 – 2014-05-30 (×5): via INTRAVENOUS
  Filled 2014-05-28 (×9): qty 150

## 2014-05-28 MED ORDER — FENTANYL BOLUS VIA INFUSION
25.0000 ug | INTRAVENOUS | Status: DC | PRN
  Filled 2014-05-28: qty 25

## 2014-05-28 MED ORDER — VITAL HIGH PROTEIN PO LIQD: 1000.0000 mL | ORAL | Status: DC

## 2014-05-28 MED ORDER — CHLORHEXIDINE GLUCONATE 0.12 % MT SOLN
15.0000 mL | Freq: Two times a day (BID) | OROMUCOSAL | Status: DC
  Administered 2014-05-28 – 2014-05-30 (×5): 15 mL via OROMUCOSAL
  Filled 2014-05-28 (×5): qty 15

## 2014-05-28 MED ORDER — ACETAMINOPHEN 325 MG PO TABS: 650.0000 mg | ORAL_TABLET | ORAL | Status: DC | PRN

## 2014-05-28 MED ORDER — SODIUM CHLORIDE 0.9 % IV SOLN
INTRAVENOUS | Status: DC
  Administered 2014-05-28: 11:00:00 via INTRAVENOUS

## 2014-05-28 MED ORDER — CISATRACURIUM BOLUS VIA INFUSION
0.1000 mg/kg | Freq: Once | INTRAVENOUS | Status: AC
  Administered 2014-05-28: 8.3 mg via INTRAVENOUS
  Filled 2014-05-28: qty 9

## 2014-05-28 MED ORDER — INSULIN ASPART 100 UNIT/ML ~~LOC~~ SOLN: 2.0000 [IU] | SUBCUTANEOUS | Status: DC

## 2014-05-28 MED ORDER — DEXTROSE 5 % IV SOLN
100.0000 mg | Freq: Two times a day (BID) | INTRAVENOUS | Status: DC
  Administered 2014-05-28 – 2014-05-30 (×5): 100 mg via INTRAVENOUS
  Filled 2014-05-28 (×6): qty 100

## 2014-05-28 MED ORDER — NOREPINEPHRINE BITARTRATE 1 MG/ML IV SOLN
0.0000 ug/min | INTRAVENOUS | Status: DC
  Administered 2014-05-28: 5 ug/min via INTRAVENOUS
  Administered 2014-05-29: 18 ug/min via INTRAVENOUS
  Filled 2014-05-28 (×3): qty 4

## 2014-05-28 MED ORDER — ALBUTEROL SULFATE (2.5 MG/3ML) 0.083% IN NEBU: 2.5000 mg | INHALATION_SOLUTION | RESPIRATORY_TRACT | Status: DC | PRN

## 2014-05-28 MED ORDER — CIPROFLOXACIN IN D5W 400 MG/200ML IV SOLN
400.0000 mg | Freq: Two times a day (BID) | INTRAVENOUS | Status: DC
  Administered 2014-05-28 – 2014-05-30 (×5): 400 mg via INTRAVENOUS
  Filled 2014-05-28 (×6): qty 200

## 2014-05-28 MED ORDER — SODIUM CHLORIDE 0.9 % IV SOLN
1.0000 ug/kg/min | INTRAVENOUS | Status: DC
  Administered 2014-05-28: 1 ug/kg/min via INTRAVENOUS
  Filled 2014-05-28 (×2): qty 20

## 2014-05-28 MED ORDER — CISATRACURIUM BOLUS VIA INFUSION
0.0500 mg/kg | INTRAVENOUS | Status: DC | PRN
  Filled 2014-05-28: qty 5

## 2014-05-28 MED ORDER — ARTIFICIAL TEARS OP OINT
1.0000 "application " | TOPICAL_OINTMENT | Freq: Three times a day (TID) | OPHTHALMIC | Status: DC
  Administered 2014-05-28 – 2014-05-29 (×5): 1 via OPHTHALMIC
  Filled 2014-05-28: qty 3.5

## 2014-05-28 MED ORDER — HYDRALAZINE HCL 20 MG/ML IJ SOLN: 10.0000 mg | INTRAMUSCULAR | Status: DC | PRN

## 2014-05-28 MED ORDER — ASPIRIN 300 MG RE SUPP
300.0000 mg | RECTAL | Status: AC
  Administered 2014-05-28: 300 mg via RECTAL
  Filled 2014-05-28: qty 1

## 2014-05-28 MED ORDER — HEPARIN SODIUM (PORCINE) 5000 UNIT/ML IJ SOLN
5000.0000 [IU] | Freq: Three times a day (TID) | INTRAMUSCULAR | Status: DC
  Administered 2014-05-28 – 2014-05-30 (×7): 5000 [IU] via SUBCUTANEOUS
  Filled 2014-05-28 (×11): qty 1

## 2014-05-28 MED ORDER — CETYLPYRIDINIUM CHLORIDE 0.05 % MT LIQD
7.0000 mL | Freq: Four times a day (QID) | OROMUCOSAL | Status: DC
  Administered 2014-05-28 – 2014-05-30 (×8): 7 mL via OROMUCOSAL

## 2014-05-28 MED ORDER — SODIUM CHLORIDE 0.9 % IV SOLN
1.0000 mg/h | INTRAVENOUS | Status: DC
  Administered 2014-05-28 – 2014-05-29 (×3): 2 mg/h via INTRAVENOUS
  Filled 2014-05-28 (×3): qty 10

## 2014-05-28 MED ORDER — SODIUM CHLORIDE 0.9 % IV SOLN: 2000.0000 mL | Freq: Once | INTRAVENOUS | Status: DC

## 2014-05-28 MED ORDER — SODIUM CHLORIDE 0.9 % IV SOLN
INTRAVENOUS | Status: DC
  Administered 2014-05-28: 1.7 [IU]/h via INTRAVENOUS
  Filled 2014-05-28 (×2): qty 2.5

## 2014-05-28 MED ORDER — IPRATROPIUM-ALBUTEROL 0.5-2.5 (3) MG/3ML IN SOLN
3.0000 mL | Freq: Four times a day (QID) | RESPIRATORY_TRACT | Status: DC
  Administered 2014-05-28 – 2014-05-30 (×10): 3 mL via RESPIRATORY_TRACT
  Filled 2014-05-28 (×10): qty 3

## 2014-05-28 MED ORDER — VITAL AF 1.2 CAL PO LIQD
1000.0000 mL | ORAL | Status: DC
  Filled 2014-05-28 (×5): qty 1000

## 2014-05-28 MED ORDER — MIDAZOLAM HCL 2 MG/2ML IJ SOLN
1.0000 mg | Freq: Once | INTRAMUSCULAR | Status: AC
  Administered 2014-05-28: 1 mg via INTRAVENOUS

## 2014-05-28 MED ORDER — SODIUM CHLORIDE 0.9 % IV SOLN
INTRAVENOUS | Status: DC | PRN
  Administered 2014-05-28: 14:00:00 via INTRAVENOUS

## 2014-05-28 MED ORDER — SODIUM BICARBONATE 8.4 % IV SOLN
50.0000 meq | Freq: Once | INTRAVENOUS | Status: AC
  Administered 2014-05-28: 50 meq via INTRAVENOUS
  Filled 2014-05-28: qty 50

## 2014-05-28 MED ORDER — MAGNESIUM SULFATE 2 GM/50ML IV SOLN
2.0000 g | Freq: Once | INTRAVENOUS | Status: AC
  Administered 2014-05-28: 2 g via INTRAVENOUS
  Filled 2014-05-28: qty 50

## 2014-05-28 MED ORDER — MAGNESIUM SULFATE 4 GM/100ML IV SOLN
4.0000 g | Freq: Once | INTRAVENOUS | Status: AC
  Administered 2014-05-28: 4 g via INTRAVENOUS
  Filled 2014-05-28: qty 100

## 2014-05-28 MED ORDER — ARTIFICIAL TEARS OP OINT
1.0000 | TOPICAL_OINTMENT | Freq: Three times a day (TID) | OPHTHALMIC | Status: DC
Start: 2014-05-28 — End: 2014-05-28
  Filled 2014-05-28: qty 3.5

## 2014-05-28 MED ORDER — SODIUM CHLORIDE 0.9 % IV SOLN
INTRAVENOUS | Status: DC | PRN
  Administered 2014-05-28: 11:00:00 via INTRAVENOUS

## 2014-05-28 NOTE — ED Notes (Signed)
Family updated.

## 2014-05-28 NOTE — Progress Notes (Signed)
Transported pt to CT and then to room in 2H03 without any complications.

## 2014-05-28 NOTE — Progress Notes (Signed)
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

## 2014-05-28 NOTE — Progress Notes (Signed)
  Echocardiogram 2D Echocardiogram has been performed.  Diamond Nickel 05/28/2014, 10:46 AM

## 2014-05-28 NOTE — Procedures (Signed)
Central Venous Catheter Insertion Procedure Note DELSHAWN Gilmore [id number] 1945/06/04  Procedure: Insertion of Central Venous Catheter Indications: Assessment of intravascular volume and Drug and/or fluid administration  Procedure Details Consent: Unable to obtain consent because of emergent medical necessity. Time Out: Verified patient identification, verified procedure, site/side was marked, verified correct patient position, special equipment/implants available, medications/allergies/relevent history reviewed, required imaging and test results available.  Performed  Maximum sterile technique was used including antiseptics, cap, gloves, gown, hand hygiene, mask and sheet. Skin prep: Chlorhexidine; local anesthetic administered A antimicrobial bonded/coated triple lumen catheter was placed in the left internal jugular vein using the Seldinger technique.  Evaluation Blood flow good Complications: No apparent complications Patient did tolerate procedure well. Chest X-ray ordered to verify placement.  CXR: pending.  Performed under direct MD supervision.  Performed using ultrasound guidance.  Wire visualized in vessel under ultrasound.   Justin Madrid, NP 05/28/2014  10:13 AM   U/S used in placement.  I was present and supervised the entire procedure.  Justin Gilmore, M.D. [facility] Pulmonary/Critical Care Medicine. Pager: [phone]. After hours pager: [phone].

## 2014-05-28 NOTE — Progress Notes (Signed)
Chaplain responded to family needing to be moved to consult room. Chaplain escorted family to consult room b and informed nurse of their location. Strong and supportive extended family present including daughter, siblings, nephew ect. Chaplain provided hospitality for family. Page chaplain as needed.    05/28/14 0000  Clinical Encounter Type  Visited With Family  Visit Type ED;Spiritual support  Referral From Nurse  Spiritual Encounters  Spiritual Needs Emotional  Stress Factors  Family Stress Factors Lack of knowledge  Justin Gilmore 05/28/2014 12:17 AM

## 2014-05-28 NOTE — Procedures (Signed)
History: 69 yo M s/p cardiac arrest.  Sedation: Versed  Technique: This is a 17 channel routine scalp EEG performed at the bedside with bipolar and monopolar montages arranged in accordance to the international 10/20 system of electrode placement. One channel was dedicated to EKG recording.   Background: The background is isoelectric with the exception of artifact. This pattern continues throughout the recording.   Photic stimulation: Physiologic driving is not performed  EEG Abnormalities: Isoelectric EEG  Clinical Interpretation: This EEG is consistent with profound cerebral dysfunction. Though sedating medications and hypothermia can confound this recording, I would not expect the dose of versed that he is on to produce this much suppression. Correlation with other neurodiagnostic testing or clinical exam is warrented. This test would not preclude a clinical diagnosis of brain death if such is warranted and pursued once paralytic/sedation has been stopped.   Roland Rack, MD [facility] [phone]  If 7pm- 7am, please page neurology on call as listed in [place].

## 2014-05-28 NOTE — Progress Notes (Signed)
PULMONARY / CRITICAL CARE MEDICINE   Name: Justin Gilmore MRN: [id number] DOB: [birth year]    ADMISSION DATE:  05/26/2014 CONSULTATION DATE:  05/28/14  REFERRING MD :  [facility]  CHIEF COMPLAINT:  Cardiorespiratory arrest  INITIAL PRESENTATION: Patient was just discharged home from [facility] yesterday for abscess and UTI. He was urinating then had an "anxiety" attack + epigastric discomfort. Then he rolled his eyes and passed out. CPR initiated by family members for 10 minutes then followed by EMS for another 20 minutes (total 30 minutes downtime), got 5 epi and 1 calcium. PEA arrest reported.  Lactate 10, troponin minimally elevated. EKG no acute ST elevation. Mild AKI on CKD.  He is a smoker and has significant EtOH intake history  STUDIES:  CXR 05/22/2014: ETT approximately 1 cm above the carina. Extensive parenchymal infiltrate, hemorrhage or edema in the central lung regions.  SIGNIFICANT EVENTS: CPR 06/04/2014  SUBJECTIVE: Head CT with diffuse edema.  VITAL SIGNS: Temp:  [88.9 F (31.6 C)-98.9 F (37.2 C)] 91.4 F (33 C) (03/14 0800) Pulse Rate:  [67-109] 75 (03/14 0800) Resp:  [14-26] 20 (03/14 0800) BP: (60-160)/(40-106) 144/95 mmHg (03/14 0400) SpO2:  [91 %-100 %] 100 % (03/14 0800) Arterial Line BP: (124-153)/(67-77) 147/70 mmHg (03/14 0800) FiO2 (%):  [50 %-100 %] 50 % (03/14 0800) Weight:  [78.472 kg (173 lb)-82.9 kg (182 lb 12.2 oz)] 82.9 kg (182 lb 12.2 oz) (03/14 0300)\  HEMODYNAMICS:   VENTILATOR SETTINGS: Vent Mode:  [-] PCV FiO2 (%):  [50 %-100 %] 50 % Set Rate:  [16 bmp-26 bmp] 20 bmp Vt Set:  [540 mL-620 mL] 620 mL PEEP:  [5 cmH20-10 cmH20] 10 cmH20 Plateau Pressure:  [16 cmH20-24 cmH20] 23 cmH20  INTAKE / OUTPUT:  Intake/Output Summary (Last 24 hours) at 05/28/14 0846 Last data filed at 05/28/14 0800  Gross per 24 hour  Intake  710.7 ml  Output    170 ml  Net  540.7 ml   PHYSICAL EXAMINATION: General:  Chronically ill appearing male, sedated and  paralyzed. Neuro:  No cough, no gag, no pain response at all, pupil dilated and fixed HEENT:  ET tube in place, no obvious trauma Cardiovascular:  RRR, no loud murmur Lungs:  Coarse BS diffusely. Abdomen:  Distended, no guarding, hypoactive bowel sounds Musculoskeletal:  No gross deformities, has grade 1 edema both legs Skin:  Chronic skin changes both LE, no ecchymosis  LABS:  CBC  Recent Labs Lab 05/24/14 0519 06/08/2014 2340  05/28/14 0318 05/28/14 0330 05/28/14 0531  WBC 3.5* 9.1  --   --  13.8*  --   HGB 7.8* 7.7*  < > 10.5* 8.8* 10.5*  HCT 23.3* 24.7*  < > 31.0* 27.1* 31.0*  PLT 113* 181  --   --  158  --   < > = values in this interval not displayed. Coag's  Recent Labs Lab 05/21/14 1800 05/28/2014 2340  APTT  --  87*  INR 1.41 2.04*   BMET  Recent Labs Lab 06/02/2014 0610 05/16/2014 2340  05/28/14 0204 05/28/14 0318 05/28/14 0531  NA 140 142  < > 142 144 144  K 4.3 5.4*  < > 4.9 4.8 4.4  CL 114* 116*  < > 119* 116* 115*  CO2 19 11*  --  12*  --   --   BUN 7 8  < > 8 6 6   CREATININE 1.98* 2.47*  < > 2.40* 2.20* 2.20*  GLUCOSE 109* 205*  < >  137* 135* 193*  < > = values in this interval not displayed. Electrolytes  Recent Labs Lab 05/17/2014 0610 06/08/2014 2340 05/28/14 0204 05/28/14 0330  CALCIUM 6.9* 7.8* 7.0*  --   MG  --   --  1.3* 1.3*  PHOS  --   --  6.6* 6.1*   Sepsis Markers  Recent Labs Lab 05/21/14 1654 05/21/2014 2349 05/28/14 0204  LATICACIDVEN 4.24* 10.76* 7.6*   ABG  Recent Labs Lab 05/28/14 0009 05/28/14 0208 05/28/14 0331  PHART 6.827* 7.219* 7.309*  PCO2ART 57.1* 23.7* 17.2*  PO2ART 153.0* 182.0* 82.0   Liver Enzymes  Recent Labs Lab 05/22/14 0347 05/23/14 0425 05/28/14 0204  AST 51* 53* 146*  ALT 35 39 57*  ALKPHOS 143* 134* 230*  BILITOT 1.2 1.4* 1.4*  ALBUMIN 1.1* 1.0* 1.1*   Cardiac Enzymes  Recent Labs Lab 06/07/2014 2350 05/28/14 0204  TROPONINI 0.05* 0.27*   Glucose  Recent Labs Lab  05/28/14 0315 05/28/14 0357 05/28/14 0528 05/28/14 0620 05/28/14 0756  GLUCAP 149* 136* 192* 230* 253*    Imaging Dg Chest Port 1 View  05/28/2014   CLINICAL DATA:  Post CPR  EXAM: PORTABLE CHEST - 1 VIEW  COMPARISON:  05/22/2014  FINDINGS: Endotracheal tube tip is approximately 1 cm above the carina. There are extensive airspace opacities in the central lung bilaterally. No large pneumothorax or effusion is evident. Left lateral costophrenic angle is excluded from the image.  IMPRESSION: ETT approximately 1 cm above the carina. Extensive parenchymal infiltrate, hemorrhage or edema in the central lung regions.   Electronically Signed   By: Andreas Newport M.D.   On: 05/28/2014 00:04   ASSESSMENT / PLAN:  PULMONARY OETT 05/28/14 >>> A: combined hypoxic and hypercarbic resp failure due to arrest COPD. Possible lung contusion from CPR P:   Continue full vet support. VAP bundle. Schedule duoneb.  CARDIOVASCULAR A: PEA arrest, question of MI vs arrhythmias leading to PEA arrest History of HTN and HLD P:  PRN BP meds, continue statin Cardio consult pending. Trend troponin, Echo, EKG. Follow CVP.  RENAL A:  AKI on CKD P:   Start Bicarb drip. BMET in AM. Replace electrolytes as indicated.  GASTROINTESTINAL A:  History of cirrhosis from EtOH abuse History of pancreatic abnormality on CT GERD P:   Continue PPI. TF per nutrition. If more stable then may finally be able to do MRCP (was not able to be done in previous hospitalization due to unable to lay flat for 30 minutes).  HEMATOLOGIC A:  Chronic anemia Elevated INR and aPTT P:  No acute intervention for now. Monitor Hb and INR + aPTT.  INFECTIOUS A:  Recent perianal abscess s/p I&D, UTI P:   BCx2  UC pseudomonas from previous hospitalization Sputum Abx: cipro BID to complete another 5 days Doxy BID to complete another 5 days  ENDOCRINE A:  No acute issues P:   Monitor blood sugar, goal 140-180. ICU  hyperglycemia protocol.  NEUROLOGIC A:  Encephalopathy likely from anoxia due to cardiac arrest P:   CT head with diffuse edema. Continue hypothermia protocol if CT shows no bleed, follow up hypothermia blood work (BMP, PT/PTT, ABG, etc). RASS goal: 0 to -2. Neurology consult appreciated.  FAMILY  - Updates: No family bedside.  Son arriving today at 15 will attempt to contact, currently on a plane.  - Inter-disciplinary family meet or Palliative Care meeting due by:    TODAY'S SUMMARY: Arrest, unknown cause at this point, hypothermia protocol, will  place TLC, start bicarb drip, check CVP post TLC placement, continue support, prognosis guarded, will follow neurology's lead on prognostication, cardiology to see..  The patient is critically ill with multiple organ systems failure and requires high complexity decision making for assessment and support, frequent evaluation and titration of therapies, application of advanced monitoring technologies and extensive interpretation of multiple databases.   Critical Care Time devoted to patient care services described in this note is  45  Minutes. This time reflects time of care of this signee Dr Jennet Maduro. This critical care time does not reflect procedure time, or teaching time or supervisory time of PA/NP/Med student/Med Resident etc but could involve care discussion time.  [name], M.D. [facility]Critical [facility] Medicine. Pager: [phone]. After hours pager: [phone].  05/28/2014, 8:46 AM

## 2014-05-28 NOTE — Progress Notes (Signed)
CRITICAL VALUE ALERT  Critical value received:Troponin .58  Date of notification:  05/28/2014  Time of notification:  1502  Critical value read back:Yes.    Nurse who received alert:  Carleene Cooper  MD notified (1st page):  Dr. Liane Comber  Time of first page:  1545  MD notified (2nd page):Dr. Liane Comber  Time of second page:1730  Responding MD:  Expected result  Time MD responded: expected result

## 2014-05-28 NOTE — Progress Notes (Signed)
[facility] Progress Note Patient Name: Justin Gilmore DOB: [birth year] MRN: [id number]   Date of Service  05/28/2014  HPI/Events of Note  hypomag/hypocalcemia  eICU Interventions  repleted     Intervention Category Intermediate Interventions: Electrolyte abnormality - evaluation and management  ALVA,RAKESH V. 05/28/2014, 4:56 AM

## 2014-05-28 NOTE — Progress Notes (Signed)
CRITICAL VALUE ALERT  Critical value received:  Lactic acid 7.6  Date of notification:  05/28/14  Time of notification:  0400  Critical value read back:Yes.    Nurse who received alert:  [place], RN  MD notified (1st page):  Dr. Elsworth Soho  Time of first page:  [phone]  Responding MD:  Dr. Elsworth Soho  Time MD responded:  [phone]  No new orders given. Will continue to monitor patient.

## 2014-05-28 NOTE — Procedures (Signed)
Arterial Catheter Insertion Procedure Note Justin Gilmore [id number] Jul 18, 1945  Procedure: Insertion of Arterial Catheter  Indications: Blood pressure monitoring and Frequent blood sampling  Procedure Details Consent: Unable to obtain consent because of altered level of consciousness. Time Out: Verified patient identification, verified procedure, site/side was marked, verified correct patient position, special equipment/implants available, medications/allergies/relevent history reviewed, required imaging and test results available.  Performed  Maximum sterile technique was used including antiseptics, gloves, gown, hand hygiene and sheet. Skin prep: Chlorhexidine; local anesthetic administered 20 gauge catheter was inserted into left radial artery using the Seldinger technique.  Evaluation Blood flow good; BP tracing good. Complications: No apparent complications. Patient tolerated well, normal saline flush used.  Line placed on second attempt, patient doppler positive for collateral circulation prior to placement. Line level, site cleansed and secured per RT protocol.  RN at bedside.  Lacretia Nicks 05/28/2014

## 2014-05-28 NOTE — H&P (Signed)
PULMONARY / CRITICAL CARE MEDICINE   Name: Justin Gilmore MRN: [id number] DOB: 05/16/1945    ADMISSION DATE:  05/26/2014 CONSULTATION DATE:  05/28/14  REFERRING MD :  [facility] [place]  CHIEF COMPLAINT:  Cardiorespiratory arrest  INITIAL PRESENTATION:   STUDIES:  CXR 06/02/2014: ETT approximately 1 cm above the carina. Extensive parenchymal infiltrate, hemorrhage or edema in the central lung regions.  SIGNIFICANT EVENTS: CPR 06/04/2014   HISTORY OF PRESENT ILLNESS:  Patient unable to give history due to altered mental status, history taken from patient's gf. Apparently patient was just discharged home from [facility] yesterday for abscess and UTI.  He was urinating then had an "anxiety" attack + epigastric discomfort. Then he rolled his eyes and passed out. CPR initiated by family members for 10 minutes then followed by EMS for another 20 minutes (total 30 minutes downtime), got 5 epi and 1 calcium. PEA arrest reported. Lactate 10, troponin minimally elevated. EKG no acute ST elevation. Mild AKI on CKD. He is a smoker and has significant EtOH intake history  PAST MEDICAL HISTORY :   has a past medical history of Hypertension; Tobacco abuse; DVT (deep venous thrombosis) (2012); GERD (gastroesophageal reflux disease); Renal cyst; Renal failure; DVT (deep venous thrombosis) (11/11/2010); Arterial insufficiency (11/11/2010); Renal insufficiency (11/11/2010); Hypertension (11/11/2010); GERD (gastroesophageal reflux disease) (11/11/2010); Hyperhomocysteinemia (12/10/2010); ETOH abuse (12/10/2010); Folic acid deficiency (12/10/2010); Peripheral vascular disease; Tubular adenoma of colon (2012); and H. pylori infection (2013).  has past surgical history that includes Femoral-popliteal Bypass Graft (06/20/2010); Back surgery; Colonoscopy ( 04/22/2011); and Esophagogastroduodenoscopy (04/22/2011). Prior to Admission medications   Medication Sig Start Date End Date Taking? Authorizing Provider  albuterol (VENTOLIN  HFA) 108 (90 BASE) MCG/ACT inhaler Inhale 1 puff into the lungs every 6 (six) hours as needed for wheezing or shortness of breath. 05/29/2014   Barton Dubois, MD  aspirin 81 MG chewable tablet Chew 81 mg by mouth daily.    Historical Provider, MD  B Complex-C (SUPER B COMPLEX PO) Take by mouth daily.    Historical Provider, MD  busPIRone (BUSPAR) 7.5 MG tablet Take 1 tablet (7.5 mg total) by mouth 3 (three) times daily. 06/08/2014   Barton Dubois, MD  calcium-vitamin D (OSCAL WITH D) 500-200 MG-UNIT per tablet Take 1 tablet by mouth daily with breakfast. 06/10/2014   Barton Dubois, MD  chlorproMAZINE (THORAZINE) 10 MG tablet Take 10 mg by mouth 4 (four) times daily as needed for hiccoughs.  06/29/13   Historical Provider, MD  ciprofloxacin (CIPRO) 500 MG tablet Take 1 tablet (500 mg total) by mouth 2 (two) times daily. 05/21/2014   Barton Dubois, MD  doxycycline (VIBRA-TABS) 100 MG tablet Take 1 tablet (100 mg total) by mouth every 12 (twelve) hours. 05/23/2014   Barton Dubois, MD  folic acid (FOLVITE) 1 MG tablet Take 1 tablet (1 mg total) by mouth daily. 05/16/2014   Barton Dubois, MD  furosemide (LASIX) 20 MG tablet Take 1 tablet (20 mg total) by mouth daily. 05/20/2014   Barton Dubois, MD  HYDROcodone-acetaminophen (NORCO/VICODIN) 5-325 MG per tablet Take 1 tablet by mouth every 6 (six) hours as needed for moderate pain.  04/26/12   Historical Provider, MD  metoprolol (LOPRESSOR) 25 MG tablet Take 0.5 tablets (12.5 mg total) by mouth 2 (two) times daily. 05/18/2014   Barton Dubois, MD  Multiple Vitamin (MULTIVITAMIN WITH MINERALS) TABS tablet Take 1 tablet by mouth daily. 06/01/2014   Barton Dubois, MD  nicotine (NICODERM CQ) 21 mg/24hr patch Place 1 patch (  21 mg total) onto the skin daily. 05/24/2014   Barton Dubois, MD  pantoprazole (PROTONIX) 40 MG tablet TAKE ONE TABLET BY MOUTH EVERY DAY 04/18/12   Andria Meuse, NP  saccharomyces boulardii (FLORASTOR) 250 MG capsule Take 1 capsule (250 mg total) by mouth 2 (two) times  daily. 05/16/2014   Barton Dubois, MD  spironolactone (ALDACTONE) 25 MG tablet Take 0.5 tablets (12.5 mg total) by mouth 2 (two) times daily. 06/02/2014   Barton Dubois, MD   No Known Allergies  FAMILY HISTORY:  indicated that his mother is deceased. He indicated that his father is alive.  SOCIAL HISTORY:  reports that he has been smoking Cigarettes.  He has a 50 pack-year smoking history. He has never used smokeless tobacco. He reports that he drinks about 1.2 - 1.8 oz of alcohol per week. He reports that he does not use illicit drugs.  REVIEW OF SYSTEMS:  Unable to obtain due to altered mental status  SUBJECTIVE:   VITAL SIGNS: Temp:  [95.5 F (35.3 C)-98.9 F (37.2 C)] 95.5 F (35.3 C) (03/14 0110) Pulse Rate:  [75-109] 94 (03/14 0110) Resp:  [16-26] 21 (03/14 0110) BP: (60-160)/(40-103) 159/103 mmHg (03/14 0110) SpO2:  [91 %-100 %] 100 % (03/14 0110) FiO2 (%):  [80 %-100 %] 80 % (03/14 0035) Weight:  [78.472 kg (173 lb)] 78.472 kg (173 lb) (03/13 2332) HEMODYNAMICS:   VENTILATOR SETTINGS: Vent Mode:  [-] PRVC FiO2 (%):  [80 %-100 %] 80 % Set Rate:  [16 bmp-26 bmp] 26 bmp Vt Set:  [540 mL-620 mL] 620 mL PEEP:  [5 cmH20] 5 cmH20 Plateau Pressure:  [20 YDX41-28 cmH20] 24 cmH20 INTAKE / OUTPUT: No intake or output data in the 24 hours ending 05/28/14 0114  PHYSICAL EXAMINATION: General:  Intubated, unresponsive, still initiating spontaneous breaths Neuro:  No cough, no gag, no pain response at all, pupil dilated and fixed HEENT:  ET tube in place, no obvious trauma Cardiovascular:  RRR, no loud murmur Lungs:  Decreased breath sounds but Clear, no wheeze Abdomen:  Distended, no guarding, hypoactive bowel sounds Musculoskeletal:  No gross deformities, has grade 1 edema both legs Skin:  Chronic skin changes both LE, no ecchymosis  LABS:  CBC  Recent Labs Lab 05/23/14 0425 05/24/14 0519 06/02/2014 2340 05/31/2014 2349  WBC 3.4* 3.5* 9.1  --   HGB 8.0* 7.8* 7.7* 8.8*  HCT  23.3* 23.3* 24.7* 26.0*  PLT 101* 113* 181  --    Coag's  Recent Labs Lab 05/21/14 1800 05/17/2014 2340  APTT  --  87*  INR 1.41 2.04*   BMET  Recent Labs Lab 05/26/14 0546 05/16/2014 0610 05/15/2014 2340 05/28/2014 2349  NA 140 140 142 142  K 4.3 4.3 5.4* 5.3*  CL 116* 114* 116* 115*  CO2 16* 19 11*  --   BUN 8 7 8 8   CREATININE 1.91* 1.98* 2.47* 2.10*  GLUCOSE 90 109* 205* 191*   Electrolytes  Recent Labs Lab 05/26/14 0546 06/05/2014 0610 05/20/2014 2340  CALCIUM 6.8* 6.9* 7.8*   Sepsis Markers  Recent Labs Lab 05/21/14 1441 05/21/14 1654 05/17/2014 2349  LATICACIDVEN 6.49* 4.24* 10.76*   ABG  Recent Labs Lab 05/28/14 0009  PHART 6.827*  PCO2ART 57.1*  PO2ART 153.0*   Liver Enzymes  Recent Labs Lab 05/21/14 1345 05/22/14 0347 05/23/14 0425  AST 43* 51* 53*  ALT 38 35 39  ALKPHOS 150* 143* 134*  BILITOT 1.2 1.2 1.4*  ALBUMIN 1.2* 1.1* 1.0*   Cardiac  Enzymes  Recent Labs Lab 06/10/2014 2350  TROPONINI 0.05*   Glucose No results for input(s): GLUCAP in the last 168 hours.  Imaging Dg Chest Port 1 View  05/28/2014   CLINICAL DATA:  Post CPR  EXAM: PORTABLE CHEST - 1 VIEW  COMPARISON:  05/22/2014  FINDINGS: Endotracheal tube tip is approximately 1 cm above the carina. There are extensive airspace opacities in the central lung bilaterally. No large pneumothorax or effusion is evident. Left lateral costophrenic angle is excluded from the image.  IMPRESSION: ETT approximately 1 cm above the carina. Extensive parenchymal infiltrate, hemorrhage or edema in the central lung regions.   Electronically Signed   By: Andreas Newport M.D.   On: 05/28/2014 00:04     ASSESSMENT / PLAN:  PULMONARY OETT 05/28/14 >>> A: combined hypoxic and hypercarbic resp failure due to arrest COPD. Possible lung contusion from CPR P:   Vent support and wean as tolerated VAP bundle Schedule duoneb  CARDIOVASCULAR A: PEA arrest, question of MI vs arrhythmias leading to  PEA arrest History of HTN and HLD P:  PRN BP meds, continue statin Cardio consult Trend troponin, Echo, EKG. Check magnesium level.  RENAL A:  AKI on CKD P:   Fluid given. Monitor renal function and electrolytes, avoid nephrotoxic meds  GASTROINTESTINAL A:  History of cirrhosis from EtOH abuse History of pancreatic abnormality on CT GERD P:   Continue PPI NPO for now then may start tube feed in AM If more stable then may finally be able to do MRCP (was not able to be done in previous hospitalization due to unable to lay flat for 30 minutes) Check amylase and lipase  HEMATOLOGIC A:  Chronic anemia Elevated INR and aPTT P:  No acute intervention for now Monitor Hb and INR + aPTT  INFECTIOUS A:  Recent perianal abscess s/p I&D, UTI P:   BCx2  UC pseudomonas from previous hospitalization Sputum Abx: cipro BID to complete another 5 days Doxy BID to complete another 5 days  ENDOCRINE A:  No acute issues P:   Monitor blo0d sugar, goal 140-180  NEUROLOGIC A:  Encephalopathy likely from anoxia due to cardiac arrest P:   Pending CT head Continue hypothermia protocol if CT shows no bleed, follow up hypothermia blood work (BMP, PT/PTT, ABG, etc) RASS goal: 0 to -2   FAMILY  - Updates: next of kin is a son who is in [place] who is currently not available  - Inter-disciplinary family meet or Palliative Care meeting due by:      TODAY'S SUMMARY: Pt was just discharged from [facility] last night for abscess and UTI. Had an "anxiety" attack at home + epigastric pain/discomfort after urinating, then passed out. PEA arrest reported, total downtime around 30 minutes. Hypothermia protocol. Known alcoholic and smoker.  Critical care time: 35 minutes    Pulmonary and [place] Pager: [phone]  05/28/2014, 1:14 AM

## 2014-05-28 NOTE — Progress Notes (Signed)
Inpatient Diabetes Program Recommendations  AACE/ADA: New Consensus Statement on Inpatient Glycemic Control (2013)  Target Ranges:  Prepandial:   less than 140 mg/dL      Peak postprandial:   less than 180 mg/dL (1-2 hours)      Critically ill patients:  140 - 180 mg/dL   Results for Justin Gilmore, Justin Gilmore (MRN [id number]) as of 05/28/2014 08:12  Ref. Range 05/28/2014 05:28 05/28/2014 06:20 05/28/2014 07:56  Glucose-Capillary Latest Range: 70-99 mg/dL 192 (H) 230 (H) 253 (H)   Reason for Visit: PEA arrest  Diabetes history: None Current orders for Inpatient glycemic control: none  Inpatient Diabetes Program Recommendations Correction (SSI): Patient's glucose is increasing to the 200's (230's, 253). Please consider starting the ICU Glycemic Control order set. Patient may need to be started on Phase 2 IV insulin since he is already above 250 mg/dl.  Thanks,  Tama Headings RN, MSN, [facility] Inpatient Diabetes Coordinator Team Pager [phone]

## 2014-05-28 NOTE — Consult Note (Addendum)
CARDIOLOGY CONSULT NOTE   Patient ID: Justin Gilmore MRN: [id number], DOB/AGE: June 05, 1945   Admit date: 05/25/2014 Date of Consult: 05/28/2014  Primary Physician: Robert Bellow, MD Primary Cardiologist: Serita Grammes, MD  Reason for consult:  S/P cardiac arrest  Problem List  Past Medical History  Diagnosis Date  . Hypertension   . Tobacco abuse   . DVT (deep venous thrombosis) 2012    Right lower ext.  Marland Kitchen GERD (gastroesophageal reflux disease)   . Renal cyst   . Renal failure     Stage 2- Stage 3  . DVT (deep venous thrombosis) 11/11/2010  . Arterial insufficiency 11/11/2010  . Renal insufficiency 11/11/2010  . Hypertension 11/11/2010  . GERD (gastroesophageal reflux disease) 11/11/2010  . 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 ETOH abuse 12/10/2010  . Folic acid deficiency 0/53/9767  . Peripheral vascular disease   . Tubular adenoma of colon 2012  . H. pylori infection 2013    treated with prevpac    Past Surgical History  Procedure Laterality Date  . 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 Adele Barthel  . Back surgery    . Colonoscopy   04/22/2011    HAL:[id number] colonic polyps - status post multiple snare polypectomies.  1 tubular adenoma, 1 hyperplastic colonic diverticulosis. Internal hemorrhoids  . Esophagogastroduodenoscopy  04/22/2011    RMR: H. pylori gastritis, peptic duodenitis, erosive reflux esophagitis; hiatal hernia     Allergies  No Known Allergies  HPI   Justin Gilmore is an 69 y.o. male history of hypertension, stage III renal failure, DVT, alcohol and tobacco abuse and peripheral vascular disease admitted on 05/26/2014 following cardiac arrest. Total resuscitation time was about 45 minutes. 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 No seizure activity has been reported. Patient was started on hypothermia protocol at 6 am this morning, this will be completed at 6 am on 3/15 when rewarming will be started. He is currently sedated and paralyzed, he is intubated and on mechanical ventilation. Hi initial troponin was 0.05 and is trending up, the last one 0.27. He has no h/o cardiac problem. WE don't know if he had chest pain prior to the arrest.   Inpatient Medications  . antiseptic oral rinse  7 mL Mouth Rinse QID  . artificial tears  1 application Both Eyes 3 times per day  . chlorhexidine  15 mL Mouth Rinse BID  . ciprofloxacin  400 mg Intravenous Q12H  . doxycycline (VIBRAMYCIN) IV  100 mg Intravenous Q12H  . heparin  5,000 Units Subcutaneous 3 times per day  . ipratropium-albuterol  3 mL Nebulization Q6H  . pantoprazole (PROTONIX) IV  40 mg Intravenous QHS   . sodium chloride 10 mL/hr at 05/28/14 1045  . cisatracurium (NIMBEX) infusion 1 mcg/kg/min (05/28/14 0800)  . fentaNYL infusion INTRAVENOUS 200 mcg/hr (05/28/14 1000)  . insulin (NOVOLIN-R) infusion 4.4 Units/hr (05/28/14 1241)  . midazolam (VERSED) infusion 2 mg/hr (05/28/14 0800)  . norepinephrine (LEVOPHED) Adult infusion    .  sodium bicarbonate  infusion 1000 mL 100 mL/hr at 05/28/14 1050   Family History  Family History  Problem Relation Age of Onset  . Diabetes Brother   . Deep vein thrombosis Brother   . Hyperlipidemia Brother   . Heart disease Brother  before age 59  . Heart attack Brother   . Hypertension Brother   . Colon cancer Neg Hx   . Cancer Mother   . Hypertension Mother   . Cancer Father   . Hypertension Father   . Heart disease Sister     Heart disease before age 12  . Hyperlipidemia Sister   . Heart attack Sister   . Hypertension Sister      Social History History   Social History  . Marital Status: Single    Spouse Name: N/A  . Number of Children: 1  . Years of Education: N/A   Occupational  History  . Retired    Social History Main Topics  . Smoking status: Current Every Day Smoker -- 1.00 packs/day for 50 years    Types: Cigarettes  . Smokeless tobacco: Never Used  . Alcohol Use: 1.2 - 1.8 oz/week    2-3 Cans of beer per week     Comment: weekends beer & liquor few drinks  . Drug Use: No  . Sexual Activity: Not on file   Other Topics Concern  . Not on file   Social History Narrative   LIves alone     Review of Systems  General:  No chills, fever, night sweats or weight changes.  Cardiovascular:  No chest pain, dyspnea on exertion, edema, orthopnea, palpitations, paroxysmal nocturnal dyspnea. Dermatological: No rash, lesions/masses Respiratory: No cough, dyspnea Urologic: No hematuria, dysuria Abdominal:   No nausea, vomiting, diarrhea, bright red blood per rectum, melena, or hematemesis Neurologic:  No visual changes, wkns, changes in mental status. All other systems reviewed and are otherwise negative except as noted above.  Physical Exam  Blood pressure 144/95, pulse 83, temperature 91.2 F (32.9 C), temperature source Core (Comment), resp. rate 20, height 5\' 9"  (1.753 m), weight 182 lb 12.2 oz (82.9 kg), SpO2 100 %.  General: sedated, paralyzed HEENT: Normal  Neck: Supple without bruits or JVD. Lungs:  Resp regular and unlabored, CTA. Heart: RRR no s3, s4, 2/6 mild systolic murmur Abdomen: Soft, non-tender, non-distended, BS + x 4.  Extremities: No clubbing, cyanosis, anasarca. DP/PT/Radials 2+ and equal bilaterally.  Labs   Recent Labs  06/14/2014 2350 05/28/14 0204  TROPONINI 0.05* 0.27*   Lab Results  Component Value Date   WBC 13.8* 05/28/2014   HGB 10.5* 05/28/2014   HCT 31.0* 05/28/2014   MCV 93.1 05/28/2014   PLT 158 05/28/2014    Recent Labs Lab 05/28/14 0204  05/28/14 0900  NA 142  < > 140  K 4.9  < > 4.3  CL 119*  < > 116*  CO2 12*  < > 14*  BUN 8  < > 6  CREATININE 2.40*  < > 2.29*  CALCIUM 7.0*  < > 7.1*  PROT 4.8*  --    --   BILITOT 1.4*  --   --   ALKPHOS 230*  --   --   ALT 57*  --   --   AST 146*  --   --   GLUCOSE 137*  < > 247*  < > = values in this interval not displayed. Lab Results  Component Value Date   CHOL  06/14/2010    160        ATP III CLASSIFICATION:  <200     mg/dL   Desirable  200-239  mg/dL   Borderline High  >=240    mg/dL   High  HDL 55 06/14/2010   LDLCALC  06/14/2010    85        Total Cholesterol/HDL:CHD Risk Coronary Heart Disease Risk Table                     Men   Women  1/2 Average Risk   3.4   3.3  Average Risk       5.0   4.4  2 X Average Risk   9.6   7.1  3 X Average Risk  23.4   11.0        Use the calculated Patient Ratio above and the CHD Risk Table to determine the patient's CHD Risk.        ATP III CLASSIFICATION (LDL):  <100     mg/dL   Optimal  100-129  mg/dL   Near or Above                    Optimal  130-159  mg/dL   Borderline  160-189  mg/dL   High  >190     mg/dL   Very High   TRIG 100 06/14/2010   Lab Results  Component Value Date   DDIMER 0.54* 09/11/2010   Radiology/Studies  Ct Abdomen Pelvis Wo Contrast  05/21/2014   CLINICAL DATA:  Right lower extremity pain and swelling. Hypotension. Decreased hemoglobin.    IMPRESSION: Diffuse and marked fatty infiltration of the liver. Small volume of perisplenic ascites is likely related to hepatic insufficiency.  Expanded tail of the pancreas which is contiguous with the left kidney and splenic hilum is not included on prior studies. Mass in the pancreatic tail is possible. A low attenuating lesion off the neck of the pancreas anterior and laterally may be a pancreatic pseudocyst but cannot be definitively characterized. MRI of the abdomen with contrast is recommended. MRI should be deferred until the patient is able to follow breath holding instructions. This could be performed as an outpatient.  Wall thickening of the colon most notable in the ascending and transverse is likely related  to hepatic insufficiency although colitis could create a similar appearance.  Low attenuating lesion in the left buttock is worrisome for abscess.  Anasarca.  Extensive atherosclerosis.   Electronically Signed   By: Inge Rise M.D.   On: 05/21/2014 19:25   Ct Head Wo Contrast  05/28/2014   CLINICAL DATA:  Status post cardiac arrest. Down for approximately 45 minutes. Initial encounter.    IMPRESSION: 1. Diffuse cerebral edema suspected, more prominently involving the gray matter, with underlying evolving infarct at the basal ganglia. Associated mass effect seen, without evidence of midline shift. No evidence of hemorrhagic transformation at this time. 2. Partial opacification of the maxillary sinuses and sphenoid sinus, and partial opacification of the ethmoid air cells.  Critical Value/emergent results were called by telephone at the time of interpretation on 05/28/2014 at 2:36 am to Dr. Elsworth Soho, who verbally acknowledged these results.     Dg Chest Port 1 View  05/28/2014   CLINICAL DATA:  Central line placement.  EXAM: PORTABLE CHEST - 1 VIEW  COMPARISON:  05/28/2014 at 0455 hours  FINDINGS: Endotracheal tube remains in place with tip approximately 3 cm above the carina. Left jugular central venous catheter has been placed with tip projecting over the mid to upper SVC. Enteric tube courses into the left upper abdomen with tip not imaged. Relatively diffuse bilateral airspace opacity is appear stable to minimally worse. No definite pleural  effusion or pneumothorax is identified.  IMPRESSION: 1. Left jugular catheter placement as above. 2. Extensive bilateral lung opacities, stable to slightly worse.   Electronically Signed   By: Logan Bores   On: 05/28/2014 10:46   Echocardiogram - 05/22/2014 Left ventricle: The cavity size was normal. Systolic function was normal. The estimated ejection fraction was in the range of 50% to 55%. Wall motion was normal; there were no regional wall motion  abnormalities. - Mitral valve: There was mild regurgitation.  ECG: SR, STD and negative inferolateral T waves, unchanged from prior ECG on 05/21/2014  Telemetry: SR    ASSESSMENT AND PLAN  69 year old male with h/o HTN, DVT, CKD stage IV, PAD, S/P Right CFA to above-knee popliteal BP, ongoing etoh and tobacco abuse admitted after cardiac arrest. Head CT is showing  diffuse cerebral edema suspected, more prominently involving the gray matter, with underlying evolving infarct at the basal ganglia. We would recommend to discontinue sq Heparin.   He has minimal troponin (0.05 --> 0.27) elevated that is most probably sec to CPR and CKD stage 3. We will keep cycling until downtrending. No heparin as stated above. No statins as his LFTs are significantly elevated. ECG is unchanged from ECG before cardiac arrest.   He is hemodynamically stable, on no pressors, telemetry normal. We would consider starting betablockers tomorrow.  Echo from 05/22/14 grossly normal, repeat echo today has very poor window, but estimated LVEF is 25%, we would consider cath if patients mental status recovers post rewarning. However, based on head CT the prognosis appears poor.   His BP is minimally elevated, we will follow, no antihypertensive indicated until he is rewarmed.  Signed, Dorothy Spark, MD, [facility] 05/28/2014, 1:11 PM

## 2014-05-28 NOTE — Progress Notes (Signed)
EEG completed; results pending.    

## 2014-05-28 NOTE — Progress Notes (Signed)
INITIAL NUTRITION ASSESSMENT  DOCUMENTATION CODES Per approved criteria  -Not Applicable   INTERVENTION: After rewarmed, initiate Vital AF 1.2 via OGT at 20 ml/hr and increase by 10 ml every 4 hours to goal rate of 65 ml/hr  TF regimen provides 1872 kcal (102% of estimated energy needs), 117 grams of protein and 1304 ml of free water  NUTRITION DIAGNOSIS: Inadequate oral intake related to inability to eat as evidenced by NPO status.   Goal: Pt to meet >/= 90% of estimated energy needs  Monitor:  TF tolerance/adequacy, vent status, weight trends, labs, I/O's  Reason for Assessment: Consult for enteral/tube feeding initiation and management   69 y.o. male  Admitting Dx: <principal problem not specified>  ASSESSMENT: 69 y/o male was just discharged from [facility] yesterday for abscess and UTI. Pt admitted for "anxiety" attack with epigastric discomfort. CPR initiated by family members for 10 minutes then followed by EMS for another 20 minutes. PMH of DVT, COPD, CKD, and ETOH abuse cirrhosis.   Labs- high Cl, Ph, creatinine, CBGs Pt is currently intubated on ventilator support and hypothermia protocol. OGT in place.  Max temperature: 37  Minute Ventilator: 10.5  Propofol: off Calculated energy needs based on current vent status.  Height: Ht Readings from Last 1 Encounters:  05/28/14 5\' 9"  (1.753 m)    Weight: Wt Readings from Last 1 Encounters:  05/28/14 182 lb 12.2 oz (82.9 kg)    Ideal Body Weight: 160 lb (72.7 kg)  % Ideal Body Weight: 114%  Wt Readings from Last 10 Encounters:  05/28/14 182 lb 12.2 oz (82.9 kg)  05/21/14 173 lb 15.1 oz (78.9 kg)  05/22/14 173 lb (78.472 kg)  05/22/14 173 lb (78.472 kg)  06/30/13 175 lb (79.379 kg)  06/24/12 180 lb 14.4 oz (82.056 kg)  05/06/12 187 lb (84.823 kg)  05/06/12 187 lb 12.8 oz (85.186 kg)  05/05/12 186 lb 3.2 oz (84.46 kg)  03/21/12 182 lb (82.555 kg)    Usual Body Weight: unknown  % Usual Body Weight:  -  BMI:  Body mass index is 26.98 kg/(m^2).  Estimated Nutritional Needs: Kcal: 1826 Protein: 100-120 grams Fluid: per MD  Skin: intact  Diet Order: Diet NPO time specified  EDUCATION NEEDS: -No education needs identified at this time   Intake/Output Summary (Last 24 hours) at 05/28/14 0905 Last data filed at 05/28/14 0800  Gross per 24 hour  Intake  710.7 ml  Output    170 ml  Net  540.7 ml    Last BM: not recorded   Labs:   Recent Labs Lab 06/13/2014 0610 06/03/2014 2340  05/28/14 0204 05/28/14 0318 05/28/14 0330 05/28/14 0531  NA 140 142  < > 142 144  --  144  K 4.3 5.4*  < > 4.9 4.8  --  4.4  CL 114* 116*  < > 119* 116*  --  115*  CO2 19 11*  --  12*  --   --   --   BUN 7 8  < > 8 6  --  6  CREATININE 1.98* 2.47*  < > 2.40* 2.20*  --  2.20*  CALCIUM 6.9* 7.8*  --  7.0*  --   --   --   MG  --   --   --  1.3*  --  1.3*  --   PHOS  --   --   --  6.6*  --  6.1*  --   GLUCOSE 109* 205*  < >  137* 135*  --  193*  < > = values in this interval not displayed.  CBG (last 3)   Recent Labs  05/28/14 0528 05/28/14 0620 05/28/14 0756  GLUCAP 192* 230* 253*    Scheduled Meds: . sodium chloride  2,000 mL Intravenous Once  . antiseptic oral rinse  7 mL Mouth Rinse QID  . artificial tears  1 application Both Eyes 3 times per day  . chlorhexidine  15 mL Mouth Rinse BID  . ciprofloxacin  400 mg Intravenous Q12H  . doxycycline (VIBRAMYCIN) IV  100 mg Intravenous Q12H  . heparin  5,000 Units Subcutaneous 3 times per day  . insulin aspart  2-6 Units Subcutaneous 6 times per day  . ipratropium-albuterol  3 mL Nebulization Q6H  . magnesium sulfate 1 - 4 g bolus IVPB  2 g Intravenous Once  . pantoprazole (PROTONIX) IV  40 mg Intravenous QHS    Continuous Infusions: . cisatracurium (NIMBEX) infusion 1 mcg/kg/min (05/28/14 0800)  . fentaNYL infusion INTRAVENOUS 100 mcg/hr (05/28/14 0800)  . midazolam (VERSED) infusion 2 mg/hr (05/28/14 0800)  . norepinephrine  (LEVOPHED) Adult infusion    .  sodium bicarbonate  infusion 1000 mL      Past Medical History  Diagnosis Date  . Hypertension   . Tobacco abuse   . DVT (deep venous thrombosis) 2012    Right lower ext.  Marland Kitchen GERD (gastroesophageal reflux disease)   . Renal cyst   . Renal failure     Stage 2- Stage 3  . DVT (deep venous thrombosis) 11/11/2010  . Arterial insufficiency 11/11/2010  . Renal insufficiency 11/11/2010  . Hypertension 11/11/2010  . GERD (gastroesophageal reflux disease) 11/11/2010  . 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 ETOH abuse 12/10/2010  . Folic acid deficiency 4/65/6812  . Peripheral vascular disease   . Tubular adenoma of colon 2012  . H. pylori infection 2013    treated with prevpac    Past Surgical History  Procedure Laterality Date  . 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 Adele Barthel  . Back surgery    . Colonoscopy   04/22/2011    [id number] colonic polyps - status post multiple snare polypectomies.  1 tubular adenoma, 1 hyperplastic colonic diverticulosis. Internal hemorrhoids  . Esophagogastroduodenoscopy  04/22/2011    RMR: H. pylori gastritis, peptic duodenitis, erosive reflux esophagitis; hiatal hernia    Wynona Dove, MS Dietetic Intern Pager: [phone]

## 2014-05-28 NOTE — Care Management Note (Signed)
    Page 1 of 1   05/28/2014     8:52:12 AM CARE MANAGEMENT NOTE 05/28/2014  Patient:  Justin Gilmore, Justin Gilmore   Account Number:  [id number]  Date Initiated:  05/28/2014  Documentation initiated by:  Elissa Hefty  Subjective/Objective Assessment:   adm w cardiac arrest-vent     Action/Plan:   lives w fam, pcp dr Newt Minion   Anticipated DC Date:     Anticipated DC Plan:  [place]         [facility] Choice  Resumption Of Svcs/PTA Provider   Choice offered to / List presented to:          [facility] arranged  HH-1 RN      [place].   Status of service:   Medicare Important Message given?   (If response is "NO", the following Medicare IM given date fields will be blank) Date Medicare IM given:   Medicare IM given by:   Date Additional Medicare IM given:   Additional Medicare IM given by:    Discharge Disposition:    Per UR Regulation:  Reviewed for med. necessity/level of care/duration of stay  If discussed at [place] of Stay Meetings, dates discussed:    Comments:

## 2014-05-28 NOTE — Consult Note (Signed)
Admission H&P    Chief Complaint: Anoxic brain injury associated with cardiac arrest.  HPI: Justin Gilmore is an 69 y.o. male history of hypertension, stage III renal failure, DVT, alcohol and tobacco abuse and peripheral vascular disease admitted on 06/03/2014 following cardiac arrest. Total resuscitation time was about 30 minutes. CT scan scan of his head showed early changes likely diffuse anoxic brain injury with early cerebral edema. No seizure activity has been reported. Patient is being started on hypothermia protocol. He has remained unresponsive, intubated and on mechanical ventilation.  Past Medical History  Diagnosis Date  . Hypertension   . Tobacco abuse   . DVT (deep venous thrombosis) 2012    Right lower ext.  Marland Kitchen GERD (gastroesophageal reflux disease)   . Renal cyst   . Renal failure     Stage 2- Stage 3  . DVT (deep venous thrombosis) 11/11/2010  . Arterial insufficiency 11/11/2010  . Renal insufficiency 11/11/2010  . Hypertension 11/11/2010  . GERD (gastroesophageal reflux disease) 11/11/2010  . 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 ETOH abuse 12/10/2010  . Folic acid deficiency 7/37/1062  . Peripheral vascular disease   . Tubular adenoma of colon 2012  . H. pylori infection 2013    treated with prevpac    Past Surgical History  Procedure Laterality Date  . 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 Adele Barthel  . Back surgery    . Colonoscopy   04/22/2011    IRS:[id number] colonic polyps - status post multiple snare polypectomies.  1 tubular adenoma, 1 hyperplastic colonic diverticulosis. Internal hemorrhoids  . Esophagogastroduodenoscopy  04/22/2011    RMR: H. pylori gastritis, peptic duodenitis, erosive reflux esophagitis; hiatal hernia    Family History  Problem Relation Age of Onset  . Diabetes Brother   . Deep vein thrombosis Brother   . Hyperlipidemia Brother   . Heart disease Brother     before age 19  .  Heart attack Brother   . Hypertension Brother   . Colon cancer Neg Hx   . Cancer Mother   . Hypertension Mother   . Cancer Father   . Hypertension Father   . Heart disease Sister     Heart disease before age 49  . Hyperlipidemia Sister   . Heart attack Sister   . Hypertension Sister    Social History:  reports that he has been smoking Cigarettes.  He has a 50 pack-year smoking history. He has never used smokeless tobacco. He reports that he drinks about 1.2 - 1.8 oz of alcohol per week. He reports that he does not use illicit drugs.  Allergies: No Known Allergies  Medications Prior to Admission  Medication Sig Dispense Refill  . albuterol (VENTOLIN HFA) 108 (90 BASE) MCG/ACT inhaler Inhale 1 puff into the lungs every 6 (six) hours as needed for wheezing or shortness of breath. 1 Inhaler 1  . aspirin 81 MG chewable tablet Chew 81 mg by mouth daily.    . B Complex-C (SUPER B COMPLEX PO) Take by mouth daily.    . busPIRone (BUSPAR) 7.5 MG tablet Take 1 tablet (7.5 mg total) by mouth 3 (three) times daily. 90 tablet 0  . calcium-vitamin D (OSCAL WITH D) 500-200 MG-UNIT per tablet Take 1 tablet by mouth daily with breakfast. 30 tablet 1  . chlorproMAZINE (THORAZINE) 10 MG tablet Take 10 mg by mouth 4 (four) times daily as needed for hiccoughs.     . ciprofloxacin (  CIPRO) 500 MG tablet Take 1 tablet (500 mg total) by mouth 2 (two) times daily. 10 tablet 0  . doxycycline (VIBRA-TABS) 100 MG tablet Take 1 tablet (100 mg total) by mouth every 12 (twelve) hours. 10 tablet 0  . folic acid (FOLVITE) 1 MG tablet Take 1 tablet (1 mg total) by mouth daily. 30 tablet 2  . furosemide (LASIX) 20 MG tablet Take 1 tablet (20 mg total) by mouth daily. 30 tablet 1  . HYDROcodone-acetaminophen (NORCO/VICODIN) 5-325 MG per tablet Take 1 tablet by mouth every 6 (six) hours as needed for moderate pain.     . metoprolol (LOPRESSOR) 25 MG tablet Take 0.5 tablets (12.5 mg total) by mouth 2 (two) times daily. 60  tablet 1  . Multiple Vitamin (MULTIVITAMIN WITH MINERALS) TABS tablet Take 1 tablet by mouth daily.    . nicotine (NICODERM CQ) 21 mg/24hr patch Place 1 patch (21 mg total) onto the skin daily. 28 patch 0  . pantoprazole (PROTONIX) 40 MG tablet TAKE ONE TABLET BY MOUTH EVERY DAY 30 tablet 11  . saccharomyces boulardii (FLORASTOR) 250 MG capsule Take 1 capsule (250 mg total) by mouth 2 (two) times daily. 60 capsule 0  . spironolactone (ALDACTONE) 25 MG tablet Take 0.5 tablets (12.5 mg total) by mouth 2 (two) times daily. 60 tablet 1    ROS: Unobtainable due to patient's unresponsive state.  Physical Examination: Blood pressure 144/95, pulse 82, temperature 93.6 F (34.2 C), temperature source Core (Comment), resp. rate 20, height 5' 9"  (1.753 m), weight 82.9 kg (182 lb 12.2 oz), SpO2 100 %.  HEENT-  Normocephalic, no lesions, without obvious abnormality.  Normal external eye and conjunctiva.  Normal TM's bilaterally.  Normal auditory canals and external ears. Normal external nose, mucus membranes and septum.  Normal pharynx. Neck supple with no masses, nodes, nodules or enlargement. Cardiovascular - regular rate and rhythm, S1, S2 normal, no murmur, click, rub or gallop Lungs - Decreased breath sounds but clear, no wheeze Abdomen - soft, non-tender; bowel sounds normal; no masses,  no organomegaly Extremities - no joint deformities, effusion, or inflammation and no edema  Neurologic Examination: Patient was intubated and on mechanical ventilation. He was sedated with Versed and fentanyl. He was also on Nembex for muscle paralysis. He was unresponsive to external stimuli including noxious stimuli. Pupils were moderately dilated and equal in size and did not react to light. Extraocular movements are absent with oculocephalic maneuvers. Corneal reflexes were absent bilaterally. No facial asymmetry was noted. Muscle tone was flaccid throughout. Patient had no spontaneous movements and no  abnormal posturing. Deep tendon reflexes were absent throughout. Plantar responses were mute bilaterally.  Results for orders placed or performed during the hospital encounter of 05/25/2014 (from the past 48 hour(s))  CBC     Status: Abnormal   Collection Time: 06/06/2014 11:40 PM  Result Value Ref Range   WBC 9.1 4.0 - 10.5 K/uL   RBC 2.60 (L) 4.22 - 5.81 MIL/uL   Hemoglobin 7.7 (L) 13.0 - 17.0 g/dL   HCT 24.7 (L) 39.0 - 52.0 %   MCV 95.0 78.0 - 100.0 fL   MCH 29.6 26.0 - 34.0 pg   MCHC 31.2 30.0 - 36.0 g/dL   RDW 16.4 (H) 11.5 - 15.5 %   Platelets 181 150 - 400 K/uL  APTT     Status: Abnormal   Collection Time: 05/22/2014 11:40 PM  Result Value Ref Range   aPTT 87 (H) 24 - 37 seconds  Comment:        IF BASELINE aPTT IS ELEVATED, SUGGEST PATIENT RISK ASSESSMENT BE USED TO DETERMINE APPROPRIATE ANTICOAGULANT THERAPY.   Basic metabolic panel     Status: Abnormal   Collection Time: 05/21/2014 11:40 PM  Result Value Ref Range   Sodium 142 135 - 145 mmol/L   Potassium 5.4 (H) 3.5 - 5.1 mmol/L    Comment: DELTA CHECK NOTED NO VISIBLE HEMOLYSIS    Chloride 116 (H) 96 - 112 mmol/L   CO2 11 (L) 19 - 32 mmol/L   Glucose, Bld 205 (H) 70 - 99 mg/dL   BUN 8 6 - 23 mg/dL   Creatinine, Ser 2.47 (H) 0.50 - 1.35 mg/dL   Calcium 7.8 (L) 8.4 - 10.5 mg/dL   GFR calc non Af Amer 25 (L) >90 mL/min   GFR calc Af Amer 29 (L) >90 mL/min    Comment: (NOTE) The eGFR has been calculated using the CKD EPI equation. This calculation has not been validated in all clinical situations. eGFR's persistently <90 mL/min signify possible Chronic Kidney Disease.    Anion gap 15 5 - 15  Protime-INR     Status: Abnormal   Collection Time: 05/25/2014 11:40 PM  Result Value Ref Range   Prothrombin Time 23.2 (H) 11.6 - 15.2 seconds   INR 2.04 (H) 0.00 - 1.49  I-Stat CG4 Lactic Acid, ED     Status: Abnormal   Collection Time: 06/07/2014 11:49 PM  Result Value Ref Range   Lactic Acid, Venous 10.76 (HH) 0.5 - 2.0  mmol/L   Comment NOTIFIED PHYSICIAN   I-Stat Chem 8, ED     Status: Abnormal   Collection Time: 05/18/2014 11:49 PM  Result Value Ref Range   Sodium 142 135 - 145 mmol/L   Potassium 5.3 (H) 3.5 - 5.1 mmol/L   Chloride 115 (H) 96 - 112 mmol/L   BUN 8 6 - 23 mg/dL   Creatinine, Ser 2.10 (H) 0.50 - 1.35 mg/dL   Glucose, Bld 191 (H) 70 - 99 mg/dL   Calcium, Ion 1.16 1.13 - 1.30 mmol/L   TCO2 9 0 - 100 mmol/L   Hemoglobin 8.8 (L) 13.0 - 17.0 g/dL   HCT 26.0 (L) 39.0 - 52.0 %   Comment NOTIFIED PHYSICIAN   Troponin I     Status: Abnormal   Collection Time: 05/19/2014 11:50 PM  Result Value Ref Range   Troponin I 0.05 (H) <0.031 ng/mL    Comment:        PERSISTENTLY INCREASED TROPONIN VALUES IN THE RANGE OF 0.04-0.49 ng/mL CAN BE SEEN IN:       -UNSTABLE ANGINA       -CONGESTIVE HEART FAILURE       -MYOCARDITIS       -CHEST TRAUMA       -ARRYHTHMIAS       -LATE PRESENTING MYOCARDIAL INFARCTION       -COPD   CLINICAL FOLLOW-UP RECOMMENDED.   I-Stat arterial blood gas, ED     Status: Abnormal   Collection Time: 05/28/14 12:09 AM  Result Value Ref Range   pH, Arterial 6.827 (LL) 7.350 - 7.450   pCO2 arterial 57.1 (HH) 35.0 - 45.0 mmHg   pO2, Arterial 153.0 (H) 80.0 - 100.0 mmHg   Bicarbonate 9.5 (L) 20.0 - 24.0 mEq/L   TCO2 11 0 - 100 mmol/L   O2 Saturation 97.0 %   Acid-base deficit 23.0 (H) 0.0 - 2.0 mmol/L   Patient temperature 97.9 F  Collection site RADIAL, ALLEN'S TEST ACCEPTABLE    Drawn by Operator    Sample type ARTERIAL    Comment NOTIFIED PHYSICIAN   Urinalysis, Routine w reflex microscopic     Status: Abnormal   Collection Time: 05/28/14 12:10 AM  Result Value Ref Range   Color, Urine YELLOW YELLOW   APPearance CLOUDY (A) CLEAR   Specific Gravity, Urine >1.030 (H) 1.005 - 1.030   pH 5.5 5.0 - 8.0   Glucose, UA NEGATIVE NEGATIVE mg/dL   Hgb urine dipstick LARGE (A) NEGATIVE   Bilirubin Urine SMALL (A) NEGATIVE   Ketones, ur NEGATIVE NEGATIVE mg/dL   Protein,  ur 30 (A) NEGATIVE mg/dL   Urobilinogen, UA 0.2 0.0 - 1.0 mg/dL   Nitrite NEGATIVE NEGATIVE   Leukocytes, UA TRACE (A) NEGATIVE  Urine microscopic-add on     Status: Abnormal   Collection Time: 05/28/14 12:10 AM  Result Value Ref Range   Squamous Epithelial / LPF RARE RARE   WBC, UA 0-2 <3 WBC/hpf   RBC / HPF 7-10 <3 RBC/hpf   Bacteria, UA RARE RARE   Casts HYALINE CASTS (A) NEGATIVE  Comprehensive metabolic panel     Status: Abnormal   Collection Time: 05/28/14  2:04 AM  Result Value Ref Range   Sodium 142 135 - 145 mmol/L   Potassium 4.9 3.5 - 5.1 mmol/L   Chloride 119 (H) 96 - 112 mmol/L   CO2 12 (L) 19 - 32 mmol/L   Glucose, Bld 137 (H) 70 - 99 mg/dL   BUN 8 6 - 23 mg/dL   Creatinine, Ser 2.40 (H) 0.50 - 1.35 mg/dL   Calcium 7.0 (L) 8.4 - 10.5 mg/dL   Total Protein 4.8 (L) 6.0 - 8.3 g/dL   Albumin 1.1 (L) 3.5 - 5.2 g/dL   AST 146 (H) 0 - 37 U/L   ALT 57 (H) 0 - 53 U/L   Alkaline Phosphatase 230 (H) 39 - 117 U/L   Total Bilirubin 1.4 (H) 0.3 - 1.2 mg/dL   GFR calc non Af Amer 26 (L) >90 mL/min   GFR calc Af Amer 31 (L) >90 mL/min    Comment: (NOTE) The eGFR has been calculated using the CKD EPI equation. This calculation has not been validated in all clinical situations. eGFR's persistently <90 mL/min signify possible Chronic Kidney Disease.    Anion gap 11 5 - 15  Magnesium     Status: Abnormal   Collection Time: 05/28/14  2:04 AM  Result Value Ref Range   Magnesium 1.3 (L) 1.5 - 2.5 mg/dL  Phosphorus     Status: Abnormal   Collection Time: 05/28/14  2:04 AM  Result Value Ref Range   Phosphorus 6.6 (H) 2.3 - 4.6 mg/dL  Amylase     Status: None   Collection Time: 05/28/14  2:04 AM  Result Value Ref Range   Amylase 15 0 - 105 U/L  Lipase, blood     Status: None   Collection Time: 05/28/14  2:04 AM  Result Value Ref Range   Lipase 15 11 - 59 U/L  Lactic acid, plasma     Status: Abnormal   Collection Time: 05/28/14  2:04 AM  Result Value Ref Range   Lactic  Acid, Venous 7.6 (HH) 0.5 - 2.0 mmol/L    Comment: REPEATED TO VERIFY CRITICAL RESULT CALLED TO, READ BACK BY AND VERIFIED WITH: J.CRAVEN RN [id number] 05/28/14 E.GADDY   Troponin I     Status: Abnormal   Collection Time:  05/28/14  2:04 AM  Result Value Ref Range   Troponin I 0.27 (H) <0.031 ng/mL    Comment:        PERSISTENTLY INCREASED TROPONIN VALUES IN THE RANGE OF 0.04-0.49 ng/mL CAN BE SEEN IN:       -UNSTABLE ANGINA       -CONGESTIVE HEART FAILURE       -MYOCARDITIS       -CHEST TRAUMA       -ARRYHTHMIAS       -LATE PRESENTING MYOCARDIAL INFARCTION       -COPD   CLINICAL FOLLOW-UP RECOMMENDED.   I-Stat arterial blood gas, ED     Status: Abnormal   Collection Time: 05/28/14  2:08 AM  Result Value Ref Range   pH, Arterial 7.219 (L) 7.350 - 7.450   pCO2 arterial 23.7 (L) 35.0 - 45.0 mmHg   pO2, Arterial 182.0 (H) 80.0 - 100.0 mmHg   Bicarbonate 9.8 (L) 20.0 - 24.0 mEq/L   TCO2 10 0 - 100 mmol/L   O2 Saturation 99.0 %   Acid-base deficit 17.0 (H) 0.0 - 2.0 mmol/L   Patient temperature 97.2 F    Collection site RADIAL, ALLEN'S TEST ACCEPTABLE    Drawn by Operator    Sample type ARTERIAL   I-STAT, chem 8     Status: Abnormal   Collection Time: 05/28/14  3:18 AM  Result Value Ref Range   Sodium 144 135 - 145 mmol/L   Potassium 4.8 3.5 - 5.1 mmol/L   Chloride 116 (H) 96 - 112 mmol/L   BUN 6 6 - 23 mg/dL   Creatinine, Ser 2.20 (H) 0.50 - 1.35 mg/dL   Glucose, Bld 135 (H) 70 - 99 mg/dL   Calcium, Ion 1.04 (L) 1.13 - 1.30 mmol/L   TCO2 9 0 - 100 mmol/L   Hemoglobin 10.5 (L) 13.0 - 17.0 g/dL   HCT 31.0 (L) 39.0 - 52.0 %   Comment NOTIFIED PHYSICIAN   CBC     Status: Abnormal   Collection Time: 05/28/14  3:30 AM  Result Value Ref Range   WBC 13.8 (H) 4.0 - 10.5 K/uL   RBC 2.91 (L) 4.22 - 5.81 MIL/uL   Hemoglobin 8.8 (L) 13.0 - 17.0 g/dL   HCT 27.1 (L) 39.0 - 52.0 %   MCV 93.1 78.0 - 100.0 fL   MCH 30.2 26.0 - 34.0 pg   MCHC 32.5 30.0 - 36.0 g/dL   RDW 15.7 (H) 11.5 -  15.5 %   Platelets 158 150 - 400 K/uL  I-STAT 3, arterial blood gas (G3+)     Status: Abnormal   Collection Time: 05/28/14  3:31 AM  Result Value Ref Range   pH, Arterial 7.309 (L) 7.350 - 7.450   pCO2 arterial 17.2 (LL) 35.0 - 45.0 mmHg   pO2, Arterial 82.0 80.0 - 100.0 mmHg   Bicarbonate 8.9 (L) 20.0 - 24.0 mEq/L   TCO2 10 0 - 100 mmol/L   O2 Saturation 97.0 %   Acid-base deficit 16.0 (H) 0.0 - 2.0 mmol/L   Patient temperature 34.4 C    Collection site RADIAL, ALLEN'S TEST ACCEPTABLE    Drawn by RT    Sample type ARTERIAL    Comment MD NOTIFIED, SUGGEST RECOLLECT    Ct Head Wo Contrast  05/28/2014   CLINICAL DATA:  Status post cardiac arrest. Down for approximately 45 minutes. Initial encounter.  EXAM: CT HEAD WITHOUT CONTRAST  TECHNIQUE: Contiguous axial images were obtained from the base of  the skull through the vertex without intravenous contrast.  COMPARISON:  None.  FINDINGS: There appears to be diffuse cerebral edema at both cerebral hemispheres, more prominently involving the gray matter, with underlying evolving infarct at the basal ganglia. The cerebellum is grossly unremarkable, though difficult to fully assess. There is no evidence of hemorrhagic transformation. Mild underlying mass effect is noted, without evidence of midline shift. No mass lesions are seen.  There is no evidence of fracture; visualized osseous structures are unremarkable in appearance. The visualized portions of the orbits are within normal limits. There is partial opacification of the maxillary sinuses and sphenoid sinus, and partial opacification of the ethmoid air cells. The remaining paranasal sinuses and mastoid air cells are well-aerated. No significant soft tissue abnormalities are seen.  IMPRESSION: 1. Diffuse cerebral edema suspected, more prominently involving the gray matter, with underlying evolving infarct at the basal ganglia. Associated mass effect seen, without evidence of midline shift. No evidence  of hemorrhagic transformation at this time. 2. Partial opacification of the maxillary sinuses and sphenoid sinus, and partial opacification of the ethmoid air cells.  Critical Value/emergent results were called by telephone at the time of interpretation on 05/28/2014 at 2:36 am to Dr. Elsworth Soho, who verbally acknowledged these results.   Electronically Signed   By: Garald Balding M.D.   On: 05/28/2014 02:41   Dg Chest Port 1 View  05/28/2014   CLINICAL DATA:  Post CPR  EXAM: PORTABLE CHEST - 1 VIEW  COMPARISON:  05/22/2014  FINDINGS: Endotracheal tube tip is approximately 1 cm above the carina. There are extensive airspace opacities in the central lung bilaterally. No large pneumothorax or effusion is evident. Left lateral costophrenic angle is excluded from the image.  IMPRESSION: ETT approximately 1 cm above the carina. Extensive parenchymal infiltrate, hemorrhage or edema in the central lung regions.   Electronically Signed   By: Andreas Newport M.D.   On: 05/28/2014 00:04    Assessment/Plan 69 year old man with multiple medical problems admitted following cardiac arrest and prolonged resuscitation with probable significant diffuse anoxic brain injury. Prognosis is indeterminate at this point.  Recommendations: 1. EEG, routine about study. 2. Repeat CT of the head without contrast in 24-48 hours.  We'll continue to follow this patient with you.  C.R. Nicole Kindred, MD [facility]  05/28/2014, 4:25 AM

## 2014-05-29 ENCOUNTER — Inpatient Hospital Stay (HOSPITAL_COMMUNITY): Payer: Medicare Other

## 2014-05-29 DIAGNOSIS — J9601 Acute respiratory failure with hypoxia: Secondary | ICD-10-CM | POA: Insufficient documentation

## 2014-05-29 DIAGNOSIS — I469 Cardiac arrest, cause unspecified: Principal | ICD-10-CM

## 2014-05-29 DIAGNOSIS — G931 Anoxic brain damage, not elsewhere classified: Secondary | ICD-10-CM

## 2014-05-29 DIAGNOSIS — F1010 Alcohol abuse, uncomplicated: Secondary | ICD-10-CM

## 2014-05-29 LAB — BASIC METABOLIC PANEL
ANION GAP: 8 (ref 5–15)
Anion gap: 9 (ref 5–15)
Anion gap: 12 (ref 5–15)
Anion gap: 9 (ref 5–15)
Anion gap: 9 (ref 5–15)
BUN: 10 mg/dL (ref 6–23)
BUN: 5 mg/dL — ABNORMAL LOW (ref 6–23)
BUN: 6 mg/dL (ref 6–23)
BUN: 8 mg/dL (ref 6–23)
BUN: 8 mg/dL (ref 6–23)
CALCIUM: 6.7 mg/dL — AB (ref 8.4–10.5)
CALCIUM: 6.6 mg/dL — AB (ref 8.4–10.5)
CHLORIDE: 109 mmol/L (ref 96–112)
CO2: 17 mmol/L — ABNORMAL LOW (ref 19–32)
CO2: 18 mmol/L — AB (ref 19–32)
CO2: 19 mmol/L (ref 19–32)
CO2: 19 mmol/L (ref 19–32)
CO2: 20 mmol/L (ref 19–32)
Calcium: 6.7 mg/dL — ABNORMAL LOW (ref 8.4–10.5)
Calcium: 6.6 mg/dL — ABNORMAL LOW (ref 8.4–10.5)
Calcium: 6.3 mg/dL — CL (ref 8.4–10.5)
Chloride: 112 mmol/L (ref 96–112)
Chloride: 111 mmol/L (ref 96–112)
Chloride: 112 mmol/L (ref 96–112)
Chloride: 110 mmol/L (ref 96–112)
Creatinine, Ser: 2.42 mg/dL — ABNORMAL HIGH (ref 0.50–1.35)
Creatinine, Ser: 2.36 mg/dL — ABNORMAL HIGH (ref 0.50–1.35)
Creatinine, Ser: 2.38 mg/dL — ABNORMAL HIGH (ref 0.50–1.35)
Creatinine, Ser: 2.51 mg/dL — ABNORMAL HIGH (ref 0.50–1.35)
Creatinine, Ser: 2.55 mg/dL — ABNORMAL HIGH (ref 0.50–1.35)
GFR calc Af Amer: 30 mL/min — ABNORMAL LOW (ref >90)
GFR calc Af Amer: 31 mL/min — ABNORMAL LOW (ref >90)
GFR calc Af Amer: 31 mL/min — ABNORMAL LOW (ref >90)
GFR calc Af Amer: 29 mL/min — ABNORMAL LOW (ref >90)
GFR calc non Af Amer: 26 mL/min — ABNORMAL LOW (ref >90)
GFR calc non Af Amer: 27 mL/min — ABNORMAL LOW (ref >90)
GFR calc non Af Amer: 27 mL/min — ABNORMAL LOW (ref >90)
GFR calc non Af Amer: 25 mL/min — ABNORMAL LOW (ref >90)
GFR, EST AFRICAN AMERICAN: 28 mL/min — AB (ref >90)
GFR, EST NON AFRICAN AMERICAN: 24 mL/min — AB (ref >90)
GLUCOSE: 128 mg/dL — AB (ref 70–99)
GLUCOSE: 96 mg/dL (ref 70–99)
GLUCOSE: 221 mg/dL — AB (ref 70–99)
Glucose, Bld: 145 mg/dL — ABNORMAL HIGH (ref 70–99)
Glucose, Bld: 158 mg/dL — ABNORMAL HIGH (ref 70–99)
POTASSIUM: 3.7 mmol/L (ref 3.5–5.1)
POTASSIUM: 3.7 mmol/L (ref 3.5–5.1)
Potassium: 3.8 mmol/L (ref 3.5–5.1)
Potassium: 3.9 mmol/L (ref 3.5–5.1)
Potassium: 3.6 mmol/L (ref 3.5–5.1)
SODIUM: 138 mmol/L (ref 135–145)
SODIUM: 137 mmol/L (ref 135–145)
Sodium: 138 mmol/L (ref 135–145)
Sodium: 141 mmol/L (ref 135–145)
Sodium: 140 mmol/L (ref 135–145)

## 2014-05-29 LAB — GLUCOSE, CAPILLARY
GLUCOSE-CAPILLARY: 130 mg/dL — AB (ref 70–99)
GLUCOSE-CAPILLARY: 126 mg/dL — AB (ref 70–99)
GLUCOSE-CAPILLARY: 117 mg/dL — AB (ref 70–99)
GLUCOSE-CAPILLARY: 155 mg/dL — AB (ref 70–99)
GLUCOSE-CAPILLARY: 219 mg/dL — AB (ref 70–99)
Glucose-Capillary: 121 mg/dL — ABNORMAL HIGH (ref 70–99)
Glucose-Capillary: 136 mg/dL — ABNORMAL HIGH (ref 70–99)
Glucose-Capillary: 144 mg/dL — ABNORMAL HIGH (ref 70–99)
Glucose-Capillary: 160 mg/dL — ABNORMAL HIGH (ref 70–99)
Glucose-Capillary: 103 mg/dL — ABNORMAL HIGH (ref 70–99)
Glucose-Capillary: 125 mg/dL — ABNORMAL HIGH (ref 70–99)
Glucose-Capillary: 91 mg/dL (ref 70–99)
Glucose-Capillary: 94 mg/dL (ref 70–99)
Glucose-Capillary: 103 mg/dL — ABNORMAL HIGH (ref 70–99)

## 2014-05-29 LAB — BLOOD GAS, ARTERIAL
Acid-base deficit: 8.5 mmol/L — ABNORMAL HIGH (ref 0.0–2.0)
Bicarbonate: 16.6 mEq/L — ABNORMAL LOW (ref 20.0–24.0)
DRAWN BY: 36277
FIO2: 0.4 %
LHR: 20 {breaths}/min
O2 Saturation: 96.3 %
PCO2 ART: 33.8 mmHg — AB (ref 35.0–45.0)
PEEP/CPAP: 10 cmH2O
PH ART: 7.311 — AB (ref 7.350–7.450)
Patient temperature: 98.6
Pressure control: 16 cmH2O
TCO2: 17.6 mmol/L (ref 0–100)
pO2, Arterial: 101 mmHg — ABNORMAL HIGH (ref 80.0–100.0)

## 2014-05-29 LAB — CBC
HCT: 26.7 % — ABNORMAL LOW (ref 39.0–52.0)
HEMOGLOBIN: 8.7 g/dL — AB (ref 13.0–17.0)
MCH: 29.3 pg (ref 26.0–34.0)
MCHC: 32.6 g/dL (ref 30.0–36.0)
MCV: 89.9 fL (ref 78.0–100.0)
Platelets: 132 10*3/uL — ABNORMAL LOW (ref 150–400)
RBC: 2.97 MIL/uL — ABNORMAL LOW (ref 4.22–5.81)
RDW: 16.1 % — ABNORMAL HIGH (ref 11.5–15.5)
WBC: 11.8 10*3/uL — ABNORMAL HIGH (ref 4.0–10.5)

## 2014-05-29 LAB — TROPONIN I
TROPONIN I: 0.69 ng/mL — AB (ref <0.031)
Troponin I: 0.63 ng/mL (ref <0.031)

## 2014-05-29 LAB — MAGNESIUM: MAGNESIUM: 1.8 mg/dL (ref 1.5–2.5)

## 2014-05-29 LAB — PHOSPHORUS: PHOSPHORUS: 5 mg/dL — AB (ref 2.3–4.6)

## 2014-05-29 MED ORDER — INSULIN GLARGINE 100 UNIT/ML ~~LOC~~ SOLN
10.0000 [IU] | SUBCUTANEOUS | Status: DC
  Administered 2014-05-29 – 2014-05-30 (×2): 10 [IU] via SUBCUTANEOUS
  Filled 2014-05-29 (×2): qty 0.1

## 2014-05-29 MED ORDER — NOREPINEPHRINE BITARTRATE 1 MG/ML IV SOLN
0.0000 ug/min | INTRAVENOUS | Status: DC
  Administered 2014-05-29: 30 ug/min via INTRAVENOUS
  Administered 2014-05-29: 40 ug/min via INTRAVENOUS
  Administered 2014-05-29: 30 ug/min via INTRAVENOUS
  Filled 2014-05-29 (×5): qty 16

## 2014-05-29 MED ORDER — INSULIN ASPART 100 UNIT/ML ~~LOC~~ SOLN
1.0000 [IU] | SUBCUTANEOUS | Status: DC
  Administered 2014-05-29: 2 [IU] via SUBCUTANEOUS
  Administered 2014-05-29: 3 [IU] via SUBCUTANEOUS
  Administered 2014-05-29 – 2014-05-30 (×3): 2 [IU] via SUBCUTANEOUS
  Administered 2014-05-30: 3 [IU] via SUBCUTANEOUS

## 2014-05-29 MED ORDER — DEXTROSE 10 % IV SOLN: INTRAVENOUS | Status: DC | PRN

## 2014-05-29 MED ORDER — MAGNESIUM SULFATE 2 GM/50ML IV SOLN
2.0000 g | Freq: Once | INTRAVENOUS | Status: AC
  Administered 2014-05-29: 2 g via INTRAVENOUS
  Filled 2014-05-29: qty 50

## 2014-05-29 MED ORDER — SODIUM CHLORIDE 0.9 % IV SOLN: 2.0000 g | Freq: Once | INTRAVENOUS | Status: DC

## 2014-05-29 NOTE — Progress Notes (Signed)
PULMONARY / CRITICAL CARE MEDICINE   Name: Justin Gilmore MRN: [id number] DOB: 19-Jul-1945    ADMISSION DATE:  05/24/2014 CONSULTATION DATE:  05/28/14  REFERRING MD :  [facility]  CHIEF COMPLAINT:  Cardiorespiratory arrest  INITIAL PRESENTATION: Patient was just discharged home from [facility] yesterday for abscess and UTI. He was urinating then had an "anxiety" attack + epigastric discomfort. Then he rolled his eyes and passed out. CPR initiated by family members for 10 minutes then followed by EMS for another 20 minutes (total 30 minutes downtime), got 5 epi and 1 calcium. PEA arrest reported.  Lactate 10, troponin minimally elevated. EKG no acute ST elevation. Mild AKI on CKD.  He is a smoker and has significant EtOH intake history  STUDIES:  CXR 05/31/2014: ETT approximately 1 cm above the carina. Extensive parenchymal infiltrate, hemorrhage or edema in the central lung regions.  SIGNIFICANT EVENTS: CPR 06/11/2014  SUBJECTIVE: Head CT with diffuse edema.  VITAL SIGNS: Temp:  [90 F (32.2 C)-93 F (33.9 C)] 93 F (33.9 C) (03/15 0900) Pulse Rate:  [70-95] 93 (03/15 0811) Resp:  [10-24] 20 (03/15 0811) BP: (104-120)/(69-75) 104/69 mmHg (03/15 0811) SpO2:  [93 %-100 %] 100 % (03/15 0811) Arterial Line BP: (80-154)/(53-91) 112/70 mmHg (03/15 0800) FiO2 (%):  [40 %-50 %] 40 % (03/15 0900) Weight:  [86.1 kg (189 lb 13.1 oz)] 86.1 kg (189 lb 13.1 oz) (03/15 0200)\  HEMODYNAMICS: CVP:  [3 mmHg-12 mmHg] 7 mmHg VENTILATOR SETTINGS: Vent Mode:  [-] PCV FiO2 (%):  [40 %-50 %] 40 % Set Rate:  [20 bmp] 20 bmp PEEP:  [10 cmH20] 10 cmH20 Plateau Pressure:  [24 cmH20-25 cmH20] 24 cmH20  INTAKE / OUTPUT:  Intake/Output Summary (Last 24 hours) at 05/29/14 0929 Last data filed at 05/29/14 0900  Gross per 24 hour  Intake 4548.78 ml  Output   1478 ml  Net 3070.78 ml   PHYSICAL EXAMINATION: General:  Chronically ill appearing male, sedated and paralyzed. Neuro:  No cough, no gag, no pain  response at all, pupil dilated and fixed HEENT:  ET tube in place, no obvious trauma Cardiovascular:  RRR, no loud murmur Lungs:  Coarse BS diffusely. Abdomen:  Distended, no guarding, hypoactive bowel sounds Musculoskeletal:  No gross deformities, has grade 1 edema both legs Skin:  Chronic skin changes both LE, no ecchymosis  LABS:  CBC  Recent Labs Lab 06/14/2014 2340  05/28/14 0330 05/28/14 0531 05/28/14 1812 05/29/14 0415  WBC 9.1  --  13.8*  --   --  11.8*  HGB 7.7*  < > 8.8* 10.5* 10.2* 8.7*  HCT 24.7*  < > 27.1* 31.0* 30.0* 26.7*  PLT 181  --  158  --   --  132*  < > = values in this interval not displayed. Coag's  Recent Labs Lab 06/07/2014 2340 05/28/14 0900  APTT 87* 46*  INR 2.04* 1.54*   BMET  Recent Labs Lab 05/29/14 0015 05/29/14 0415 05/29/14 0800  NA 138 141 140  K 3.7 3.8 3.9  CL 112 111 112  CO2 17* 18* 19  BUN 6 5* 10  CREATININE 2.42* 2.36* 2.38*  GLUCOSE 145* 128* 96   Electrolytes  Recent Labs Lab 05/28/14 0204 05/28/14 0330  05/29/14 0015 05/29/14 0415 05/29/14 0800  CALCIUM 7.0*  --   < > 6.7* 6.6* 6.7*  MG 1.3* 1.3*  --   --  1.8  --   PHOS 6.6* 6.1*  --   --  5.0*  --   < > = values in this interval not displayed. Sepsis Markers  Recent Labs Lab 05/22/2014 2349 05/28/14 0204  LATICACIDVEN 10.76* 7.6*   ABG  Recent Labs Lab 05/28/14 0208 05/28/14 0331 05/29/14 0330  PHART 7.219* 7.309* 7.311*  PCO2ART 23.7* 17.2* 33.8*  PO2ART 182.0* 82.0 101.0*   Liver Enzymes  Recent Labs Lab 05/23/14 0425 05/28/14 0204  AST 53* 146*  ALT 39 57*  ALKPHOS 134* 230*  BILITOT 1.4* 1.4*  ALBUMIN 1.0* 1.1*   Cardiac Enzymes  Recent Labs Lab 05/28/14 2000 05/29/14 0215 05/29/14 0800  TROPONINI 0.58* 0.63* 0.69*   Glucose  Recent Labs Lab 05/29/14 0419 05/29/14 0519 05/29/14 0605 05/29/14 0716 05/29/14 0750 05/29/14 0912  GLUCAP 126* 125* 103* 91 94 103*   Imaging Ct Head Wo Contrast  05/28/2014   CLINICAL  DATA:  Status post cardiac arrest. Down for approximately 45 minutes. Initial encounter.  EXAM: CT HEAD WITHOUT CONTRAST  TECHNIQUE: Contiguous axial images were obtained from the base of the skull through the vertex without intravenous contrast.  COMPARISON:  None.  FINDINGS: There appears to be diffuse cerebral edema at both cerebral hemispheres, more prominently involving the gray matter, with underlying evolving infarct at the basal ganglia. The cerebellum is grossly unremarkable, though difficult to fully assess. There is no evidence of hemorrhagic transformation. Mild underlying mass effect is noted, without evidence of midline shift. No mass lesions are seen.  There is no evidence of fracture; visualized osseous structures are unremarkable in appearance. The visualized portions of the orbits are within normal limits. There is partial opacification of the maxillary sinuses and sphenoid sinus, and partial opacification of the ethmoid air cells. The remaining paranasal sinuses and mastoid air cells are well-aerated. No significant soft tissue abnormalities are seen.  IMPRESSION: 1. Diffuse cerebral edema suspected, more prominently involving the gray matter, with underlying evolving infarct at the basal ganglia. Associated mass effect seen, without evidence of midline shift. No evidence of hemorrhagic transformation at this time. 2. Partial opacification of the maxillary sinuses and sphenoid sinus, and partial opacification of the ethmoid air cells.  Critical Value/emergent results were called by telephone at the time of interpretation on 05/28/2014 at 2:36 am to Dr. Elsworth Soho, who verbally acknowledged these results.   Electronically Signed   By: Garald Balding M.D.   On: 05/28/2014 02:41   Dg Chest Port 1 View  05/28/2014   CLINICAL DATA:  Central line placement.  EXAM: PORTABLE CHEST - 1 VIEW  COMPARISON:  05/28/2014 at 0455 hours  FINDINGS: Endotracheal tube remains in place with tip approximately 3 cm above  the carina. Left jugular central venous catheter has been placed with tip projecting over the mid to upper SVC. Enteric tube courses into the left upper abdomen with tip not imaged. Relatively diffuse bilateral airspace opacity is appear stable to minimally worse. No definite pleural effusion or pneumothorax is identified.  IMPRESSION: 1. Left jugular catheter placement as above. 2. Extensive bilateral lung opacities, stable to slightly worse.   Electronically Signed   By: Logan Bores   On: 05/28/2014 10:46   Dg Chest Port 1 View  05/28/2014   CLINICAL DATA:  Hypoxia  EXAM: PORTABLE CHEST - 1 VIEW  COMPARISON:  06/11/2014  FINDINGS: The endotracheal tube tip is approximately 2.5 cm above the carina. Nasogastric tube extends into the stomach and off the inferior edge of the image. Multifocal airspace opacity persists.  IMPRESSION: ET tube tip approximately 2.5 cm  above the carina.   Electronically Signed   By: Andreas Newport M.D.   On: 05/28/2014 05:28   ASSESSMENT / PLAN:  PULMONARY OETT 05/28/14 >>> A: combined hypoxic and hypercarbic resp failure due to arrest COPD. Possible lung contusion from CPR P:   Continue full vet support until off paralytics and hypothermia protocol is complete, will arrange for family meeting in AM. VAP bundle. Schedule duoneb.  CARDIOVASCULAR A: PEA arrest, question of MI vs arrhythmias leading to PEA arrest History of HTN and HLD P:  PRN BP meds, continue statin Cardio consult pending. Trend troponin, Echo, EKG. Follow CVP. Levophed for BP support.  RENAL A:  AKI on CKD P:   Continue Bicarb drip. BMET in AM. Replace electrolytes as indicated. Will need diureses once off pressors, hold for now.  GASTROINTESTINAL A:  History of cirrhosis from EtOH abuse History of pancreatic abnormality on CT GERD P:   Continue PPI. TF per nutrition once off hypothermia. If more stable then may finally be able to do MRCP (was not able to be done in previous  hospitalization due to unable to lay flat for 30 minutes).  HEMATOLOGIC A:  Chronic anemia Elevated INR and aPTT P:  No acute intervention for now. Monitor Hb and INR + aPTT.  INFECTIOUS A:  Recent perianal abscess s/p I&D, UTI P:   BCx2  UC pseudomonas from previous hospitalization Sputum. Abx: cipro BID to complete another 4 days. Doxy BID to complete another 4 days.  ENDOCRINE A:  No acute issues P:   Monitor blood sugar, goal 140-180. ICU hyperglycemia protocol.  NEUROLOGIC A:  Encephalopathy likely from anoxia due to cardiac arrest P:   CT head with diffuse edema. EEG with poor findings. Neurology feels prognosis is very poor. Continue hypothermia protocol if CT shows no bleed, follow up hypothermia blood work (BMP, PT/PTT, ABG, etc). RASS goal: 0 to -2. Neurology consult appreciated.  FAMILY  - Updates: No family bedside.  Arrange for family meeting in AM with the recommendation of withdrawal of care.  - Inter-disciplinary family meet or Palliative Care meeting due by:    TODAY'S SUMMARY: Prognosis is very poor here, no chances at real recovery, family meeting in AM, full code for now as no family is bedside.  The patient is critically ill with multiple organ systems failure and requires high complexity decision making for assessment and support, frequent evaluation and titration of therapies, application of advanced monitoring technologies and extensive interpretation of multiple databases.   Critical Care Time devoted to patient care services described in this note is  45  Minutes. This time reflects time of care of this signee Dr Jennet Maduro. This critical care time does not reflect procedure time, or teaching time or supervisory time of PA/NP/Med student/Med Resident etc but could involve care discussion time.  [name], M.D. [facility] Pulmonary/Critical Care Medicine. Pager: [phone]. After hours pager: [phone].  05/29/2014, 9:29 AM

## 2014-05-29 NOTE — Progress Notes (Signed)
200 cc fentanyl drip (10 mcg per cc) and 40 cc versed drip (1 mg per cc) wasted in sink followed by water flush by 2 RN.

## 2014-05-29 NOTE — Progress Notes (Signed)
Subjective: No overnight events. EEG completed with findings consistent with profound cerebral dysfunction.   Objective: Current vital signs: BP 104/69 mmHg  Pulse 93  Temp(Src) 92.5 F (33.6 C) (Core (Comment))  Resp 20  Ht 5\' 9"  (1.753 m)  Wt 86.1 kg (189 lb 13.1 oz)  BMI 28.02 kg/m2  SpO2 100% Vital signs in last 24 hours: Temp:  [90 F (32.2 C)-92.5 F (33.6 C)] 92.5 F (33.6 C) (03/15 0800) Pulse Rate:  [70-95] 93 (03/15 0811) Resp:  [10-24] 20 (03/15 0811) BP: (104-120)/(69-75) 104/69 mmHg (03/15 0811) SpO2:  [93 %-100 %] 100 % (03/15 0811) Arterial Line BP: (80-154)/(53-91) 112/70 mmHg (03/15 0800) FiO2 (%):  [40 %-50 %] 40 % (03/15 0811) Weight:  [86.1 kg (189 lb 13.1 oz)] 86.1 kg (189 lb 13.1 oz) (03/15 0200)  Intake/Output from previous day: 03/14 0701 - 03/15 0700 In: 4284.9 [I.V.:3459.9; IV Piggyback:825] Out: 2563 [Urine:1563] Intake/Output this shift: Total I/O In: 155.1 [I.V.:155.1] Out: 20 [Urine:20] Nutritional status: Diet NPO time specified  Neurologic Exam: Patient was intubated and on mechanical ventilation. He was unresponsive to external stimuli including noxious stimuli. Pupils were moderately dilated and equal in size and did not react to light. Extraocular movements are absent with oculocephalic maneuvers. Corneal reflexes were absent bilaterally. No facial asymmetry was noted. Muscle tone was flaccid throughout. Patient had no spontaneous movements and no abnormal posturing.  Lab Results: Basic Metabolic Panel:  Recent Labs Lab 05/28/14 0204  05/28/14 0330  05/28/14 0900 05/28/14 1130 05/28/14 1812 05/28/14 2000 05/29/14 0015 05/29/14 0415  NA 142  < >  --   < > 140 138 143 141 138 141  K 4.9  < >  --   < > 4.3 4.1 3.9 3.9 3.7 3.8  CL 119*  < >  --   < > 116* 113* 111 115* 112 111  CO2 12*  --   --   < > 14* 15*  --  17* 17* 18*  GLUCOSE 137*  < >  --   < > 247* 241* 209* 168* 145* 128*  BUN 8  < >  --   < > 6 8 6 7 6  5*   CREATININE 2.40*  < >  --   < > 2.29* 2.36* 2.30* 2.38* 2.42* 2.36*  CALCIUM 7.0*  --   --   < > 7.1* 6.9*  --  6.9* 6.7* 6.6*  MG 1.3*  --  1.3*  --   --   --   --   --   --  1.8  PHOS 6.6*  --  6.1*  --   --   --   --   --   --  5.0*  < > = values in this interval not displayed.  Liver Function Tests:  Recent Labs Lab 05/23/14 0425 05/28/14 0204  AST 53* 146*  ALT 39 57*  ALKPHOS 134* 230*  BILITOT 1.4* 1.4*  PROT 3.8* 4.8*  ALBUMIN 1.0* 1.1*    Recent Labs Lab 05/28/14 0204  LIPASE 15  AMYLASE 15   No results for input(s): AMMONIA in the last 168 hours.  CBC:  Recent Labs Lab 05/23/14 0425 05/24/14 0519 06/08/2014 2340  05/28/14 0318 05/28/14 0330 05/28/14 0531 05/28/14 1812 05/29/14 0415  WBC 3.4* 3.5* 9.1  --   --  13.8*  --   --  11.8*  HGB 8.0* 7.8* 7.7*  < > 10.5* 8.8* 10.5* 10.2* 8.7*  HCT 23.3* 23.3* 24.7*  < >  31.0* 27.1* 31.0* 30.0* 26.7*  MCV 86.6 87.6 95.0  --   --  93.1  --   --  89.9  PLT 101* 113* 181  --   --  158  --   --  132*  < > = values in this interval not displayed.  Cardiac Enzymes:  Recent Labs Lab 06/14/2014 2350 05/28/14 0204 05/28/14 1300 05/28/14 2000 05/29/14 0215  TROPONINI 0.05* 0.27* 0.58* 0.58* 0.63*    Lipid Panel: No results for input(s): CHOL, TRIG, HDL, CHOLHDL, VLDL, LDLCALC in the last 168 hours.  CBG:  Recent Labs Lab 05/29/14 0419 05/29/14 0519 05/29/14 0605 05/29/14 0716 05/29/14 0750  GLUCAP 126* 125* 103* 91 94    Microbiology: Results for orders placed or performed during the hospital encounter of 05/26/2014  MRSA PCR Screening     Status: None   Collection Time: 05/28/14  3:55 AM  Result Value Ref Range Status   MRSA by PCR NEGATIVE NEGATIVE Final    Comment:        The GeneXpert MRSA Assay (FDA approved for NASAL specimens only), is one component of a comprehensive MRSA colonization surveillance program. It is not intended to diagnose MRSA infection nor to guide or monitor treatment  for MRSA infections.     Coagulation Studies:  Recent Labs  05/28/2014 2340 05/28/14 0900  LABPROT 23.2* 18.7*  INR 2.04* 1.54*    Imaging: Ct Head Wo Contrast  05/28/2014   CLINICAL DATA:  Status post cardiac arrest. Down for approximately 45 minutes. Initial encounter.  EXAM: CT HEAD WITHOUT CONTRAST  TECHNIQUE: Contiguous axial images were obtained from the base of the skull through the vertex without intravenous contrast.  COMPARISON:  None.  FINDINGS: There appears to be diffuse cerebral edema at both cerebral hemispheres, more prominently involving the gray matter, with underlying evolving infarct at the basal ganglia. The cerebellum is grossly unremarkable, though difficult to fully assess. There is no evidence of hemorrhagic transformation. Mild underlying mass effect is noted, without evidence of midline shift. No mass lesions are seen.  There is no evidence of fracture; visualized osseous structures are unremarkable in appearance. The visualized portions of the orbits are within normal limits. There is partial opacification of the maxillary sinuses and sphenoid sinus, and partial opacification of the ethmoid air cells. The remaining paranasal sinuses and mastoid air cells are well-aerated. No significant soft tissue abnormalities are seen.  IMPRESSION: 1. Diffuse cerebral edema suspected, more prominently involving the gray matter, with underlying evolving infarct at the basal ganglia. Associated mass effect seen, without evidence of midline shift. No evidence of hemorrhagic transformation at this time. 2. Partial opacification of the maxillary sinuses and sphenoid sinus, and partial opacification of the ethmoid air cells.  Critical Value/emergent results were called by telephone at the time of interpretation on 05/28/2014 at 2:36 am to Dr. Elsworth Soho, who verbally acknowledged these results.   Electronically Signed   By: Garald Balding M.D.   On: 05/28/2014 02:41   Dg Chest Port 1  View  05/29/2014   CLINICAL DATA:  Hypoxia/difficulty breathing  EXAM: PORTABLE CHEST - 1 VIEW  COMPARISON:  May 28, 2014  FINDINGS: Endotracheal tube tip is 3.2 cm above the carina. Nasogastric tube tip and side port are below the diaphragm. Central catheter tip is at the junction of the left innominate vein and superior vena cava. No pneumothorax. There is moderate interstitial edema throughout the lungs bilaterally. There is no appreciable airspace consolidation. Heart is mildly enlarged  with pulmonary venous hypertension. No adenopathy.  IMPRESSION: Evidence of congestive heart failure, stable. Tube and catheter positions as described without pneumothorax.   Electronically Signed   By: Lowella Grip III M.D.   On: 05/29/2014 07:28   Dg Chest Port 1 View  05/28/2014   CLINICAL DATA:  Central line placement.  EXAM: PORTABLE CHEST - 1 VIEW  COMPARISON:  05/28/2014 at 0455 hours  FINDINGS: Endotracheal tube remains in place with tip approximately 3 cm above the carina. Left jugular central venous catheter has been placed with tip projecting over the mid to upper SVC. Enteric tube courses into the left upper abdomen with tip not imaged. Relatively diffuse bilateral airspace opacity is appear stable to minimally worse. No definite pleural effusion or pneumothorax is identified.  IMPRESSION: 1. Left jugular catheter placement as above. 2. Extensive bilateral lung opacities, stable to slightly worse.   Electronically Signed   By: Logan Bores   On: 05/28/2014 10:46   Dg Chest Port 1 View  05/28/2014   CLINICAL DATA:  Hypoxia  EXAM: PORTABLE CHEST - 1 VIEW  COMPARISON:  06/14/2014  FINDINGS: The endotracheal tube tip is approximately 2.5 cm above the carina. Nasogastric tube extends into the stomach and off the inferior edge of the image. Multifocal airspace opacity persists.  IMPRESSION: ET tube tip approximately 2.5 cm above the carina.   Electronically Signed   By: Andreas Newport M.D.   On: 05/28/2014  05:28   Dg Chest Port 1 View  05/28/2014   CLINICAL DATA:  Post CPR  EXAM: PORTABLE CHEST - 1 VIEW  COMPARISON:  05/22/2014  FINDINGS: Endotracheal tube tip is approximately 1 cm above the carina. There are extensive airspace opacities in the central lung bilaterally. No large pneumothorax or effusion is evident. Left lateral costophrenic angle is excluded from the image.  IMPRESSION: ETT approximately 1 cm above the carina. Extensive parenchymal infiltrate, hemorrhage or edema in the central lung regions.   Electronically Signed   By: Andreas Newport M.D.   On: 05/28/2014 00:04    Medications:  Scheduled: . antiseptic oral rinse  7 mL Mouth Rinse QID  . artificial tears  1 application Both Eyes 3 times per day  . chlorhexidine  15 mL Mouth Rinse BID  . ciprofloxacin  400 mg Intravenous Q12H  . doxycycline (VIBRAMYCIN) IV  100 mg Intravenous Q12H  . heparin  5,000 Units Subcutaneous 3 times per day  . insulin aspart  1-3 Units Subcutaneous 6 times per day  . insulin glargine  10 Units Subcutaneous Q24H  . ipratropium-albuterol  3 mL Nebulization Q6H  . pantoprazole (PROTONIX) IV  40 mg Intravenous QHS    Assessment/Plan: 69 year old man with multiple medical problems admitted following cardiac arrest and prolonged resuscitation with probable significant diffuse anoxic brain injury. EEG is consistent with profound cerebral dysfunction. Currently in rewarming protocol. Guarded prognosis for meaningful neurological recovery at this time.   LOS: 1 day   Jim Like, DO [facility] [phone]  If 7pm- 7am, please page neurology on call as listed in [place]. 05/29/2014  9:05 AM

## 2014-05-29 NOTE — Progress Notes (Signed)
CRITICAL VALUE ALERT  Critical value received:  Calcium 6.3 Date of notification: 05/29/2014  Time of notification:  1703  Critical value read back:Yes.    Nurse who received alert:  Carleene Cooper RN   MD notified (1st page):  Isaac Bliss at [place] notified.  She will notify Dr. Corinna Lines.  Time of first page:  1703  MD notified (2nd page):  Time of second page:  Responding MD:  Ysidro Evert RN Time MD responded:  [phone]

## 2014-05-29 NOTE — Progress Notes (Signed)
[facility] Progress Note Patient Name: MCDANIEL OHMS DOB: November 23, 1945 MRN: [id number]   Date of Service  05/29/2014  HPI/Events of Note  Called d/t Ca++ = 6.3 and albumin = 1.1. Calcium corrected for albumin = 8.62 which is normal.  eICU Interventions  Calcium supplementation not indicated.      Intervention Category Intermediate Interventions: Electrolyte abnormality - evaluation and management  Sommer,Steven Eugene 05/29/2014, 6:17 PM

## 2014-05-29 NOTE — Progress Notes (Addendum)
Patient Name: Maretta Los Date of Encounter: 05/29/2014  Active Problems:   Hypertension   ETOH abuse   Cardiac arrest   Anoxic brain injury   Troponin level elevated   Length of Stay: 1  SUBJECTIVE  The patient is sedated and paralyzed. He was started on Levophed.   CURRENT MEDS . antiseptic oral rinse  7 mL Mouth Rinse QID  . artificial tears  1 application Both Eyes 3 times per day  . chlorhexidine  15 mL Mouth Rinse BID  . ciprofloxacin  400 mg Intravenous Q12H  . doxycycline (VIBRAMYCIN) IV  100 mg Intravenous Q12H  . heparin  5,000 Units Subcutaneous 3 times per day  . insulin aspart  1-3 Units Subcutaneous 6 times per day  . insulin glargine  10 Units Subcutaneous Q24H  . ipratropium-albuterol  3 mL Nebulization Q6H  . pantoprazole (PROTONIX) IV  40 mg Intravenous QHS   . sodium chloride Stopped (05/28/14 1600)  . cisatracurium (NIMBEX) infusion 1 mcg/kg/min (05/28/14 2000)  . dextrose    . feeding supplement (VITAL AF 1.2 CAL) Stopped (05/28/14 1345)  . fentaNYL infusion INTRAVENOUS 200 mcg/hr (05/28/14 1939)  . insulin (NOVOLIN-R) infusion Stopped (05/29/14 0800)  . midazolam (VERSED) infusion 2 mg/hr (05/28/14 2000)  . norepinephrine (LEVOPHED) Adult infusion 35 mcg/min (05/29/14 0832)  .  sodium bicarbonate  infusion 1000 mL 100 mL/hr at 05/29/14 0758    OBJECTIVE  Filed Vitals:   05/29/14 0700 05/29/14 0800 05/29/14 0811 05/29/14 0900  BP:   104/69   Pulse: 84 93 93   Temp: 92.3 F (33.5 C) 92.5 F (33.6 C)  93 F (33.9 C)  TempSrc: Core (Comment) Core (Comment)  Core (Comment)  Resp: 20 20 20    Height:      Weight:      SpO2: 100% 100% 100%     Intake/Output Summary (Last 24 hours) at 05/29/14 0912 Last data filed at 05/29/14 0800  Gross per 24 hour  Intake 4391.04 ml  Output   1448 ml  Net 2943.04 ml   Filed Weights   06/06/2014 2332 05/28/14 0300 05/29/14 0200  Weight: 173 lb (78.472 kg) 182 lb 12.2 oz (82.9 kg) 189 lb 13.1 oz (86.1  kg)    PHYSICAL EXAM  General: Sedated, paralyzed Neck: unable to assess JVDs Lungs:  Rales B/L Heart: RRR no s3, s4, 2/6 systolic murmur. Abdomen: anasarca.  Extremities: No clubbing, cyanosis, 3+ pitting edema, DP/PT/Radials poor pulses B/L.  Accessory Clinical Findings  CBC  Recent Labs  05/28/14 0330  05/28/14 1812 05/29/14 0415  WBC 13.8*  --   --  11.8*  HGB 8.8*  < > 10.2* 8.7*  HCT 27.1*  < > 30.0* 26.7*  MCV 93.1  --   --  89.9  PLT 158  --   --  132*  < > = values in this interval not displayed.   Basic Metabolic Panel  Recent Labs  05/28/14 0330  05/29/14 0015 05/29/14 0415  NA  --   < > 138 141  K  --   < > 3.7 3.8  CL  --   < > 112 111  CO2  --   < > 17* 18*  GLUCOSE  --   < > 145* 128*  BUN  --   < > 6 5*  CREATININE  --   < > 2.42* 2.36*  CALCIUM  --   < > 6.7* 6.6*  MG 1.3*  --   --  1.8  PHOS 6.1*  --   --  5.0*  < > = values in this interval not displayed. Liver Function Tests  Recent Labs  05/28/14 0204  AST 146*  ALT 57*  ALKPHOS 230*  BILITOT 1.4*  PROT 4.8*  ALBUMIN 1.1*    Recent Labs  05/28/14 0204  LIPASE 15  AMYLASE 15   Cardiac Enzymes  Recent Labs  05/28/14 1300 05/28/14 2000 05/29/14 0215  TROPONINI 0.58* 0.58* 0.63*   Radiology/Studies  Dg Chest Port 1 View  05/29/2014   CLINICAL DATA:  Hypoxia/difficulty breathing  EXAM: PORTABLE CHEST - 1 VIEW  COMPARISON:  May 28, 2014   IMPRESSION: Evidence of congestive heart failure, stable. Tube and catheter positions as described without pneumothorax.     TELE: SR --> a-fib at 7:30 am today, then SR with frequent PACs, and possible idiopathic ventricular rhythm     ASSESSMENT AND PLAN  69 year old male with h/o HTN, DVT, CKD stage IV, PAD, S/P Right CFA to above-knee popliteal BP, ongoing etoh and tobacco abuse admitted after cardiac arrest. Head CT is showing diffuse cerebral edema suspected, more prominently involving the gray matter, with underlying  evolving infarct at the basal ganglia.  He has troponin elevation (0.05 --> 0.27-->0.58--> 0.62) elevated that is most probably sec to CPR and CKD stage 3. We can stop cycling. No heparin infusion as he has new stroke on head CT. No statins as his LFTs are significantly elevated. ECG is unchanged from ECG before cardiac arrest.   The patient was started on Levophed. Now in atrial fibrillation that is rate controlled. No Heparin.   He has been receiving significant amount of fluids and is in anasarca, gained 16 lbs in 2 days, 173 --> 189 lbs, I would recommend to hold iv fluids if ok with CCM. No Lasix as his CVP is 7 and he is hypotensive.   Per CCM and neurology his prognosis is very poor. Echo from yesterday showed LVEF is 25%. Elevated LFTs and worsening Crea.  With poor neurological prognosis and multiorgan failure we are not planning on any further intervention unless patient recovers his mental status.  Family meeting set up for tomorrow.   We will sign off.  Signed, Dorothy Spark MD, [facility] 05/29/2014

## 2014-05-30 ENCOUNTER — Inpatient Hospital Stay (HOSPITAL_COMMUNITY): Payer: Medicare Other

## 2014-05-30 LAB — BLOOD GAS, ARTERIAL
Acid-base deficit: 1.5 mmol/L (ref 0.0–2.0)
Bicarbonate: 21.9 mEq/L (ref 20.0–24.0)
Drawn by: 43098
FIO2: 0.4 %
LHR: 20 {breaths}/min
O2 SAT: 95.8 %
PEEP/CPAP: 10 cmH2O
PRESSURE CONTROL: 16 cmH2O
Patient temperature: 98.6
TCO2: 22.9 mmol/L (ref 0–100)
pCO2 arterial: 31.9 mmHg — ABNORMAL LOW (ref 35.0–45.0)
pH, Arterial: 7.452 — ABNORMAL HIGH (ref 7.350–7.450)
pO2, Arterial: 87.8 mmHg (ref 80.0–100.0)

## 2014-05-30 LAB — BASIC METABOLIC PANEL
ANION GAP: 5 (ref 5–15)
BUN: 9 mg/dL (ref 6–23)
CALCIUM: 6.4 mg/dL — AB (ref 8.4–10.5)
CO2: 26 mmol/L (ref 19–32)
Chloride: 103 mmol/L (ref 96–112)
Creatinine, Ser: 2.85 mg/dL — ABNORMAL HIGH (ref 0.50–1.35)
GFR calc Af Amer: 25 mL/min — ABNORMAL LOW (ref >90)
GFR calc non Af Amer: 21 mL/min — ABNORMAL LOW (ref >90)
GLUCOSE: 229 mg/dL — AB (ref 70–99)
POTASSIUM: 3.6 mmol/L (ref 3.5–5.1)
SODIUM: 134 mmol/L — AB (ref 135–145)

## 2014-05-30 LAB — CBC
HCT: 27.3 % — ABNORMAL LOW (ref 39.0–52.0)
Hemoglobin: 9 g/dL — ABNORMAL LOW (ref 13.0–17.0)
MCH: 29 pg (ref 26.0–34.0)
MCHC: 33 g/dL (ref 30.0–36.0)
MCV: 88.1 fL (ref 78.0–100.0)
PLATELETS: 86 10*3/uL — AB (ref 150–400)
RBC: 3.1 MIL/uL — ABNORMAL LOW (ref 4.22–5.81)
RDW: 16.3 % — ABNORMAL HIGH (ref 11.5–15.5)
WBC: 12.4 10*3/uL — ABNORMAL HIGH (ref 4.0–10.5)

## 2014-05-30 LAB — GLUCOSE, CAPILLARY
GLUCOSE-CAPILLARY: 173 mg/dL — AB (ref 70–99)
GLUCOSE-CAPILLARY: 177 mg/dL — AB (ref 70–99)
Glucose-Capillary: 175 mg/dL — ABNORMAL HIGH (ref 70–99)
Glucose-Capillary: 242 mg/dL — ABNORMAL HIGH (ref 70–99)

## 2014-05-30 LAB — MAGNESIUM: Magnesium: 1.9 mg/dL (ref 1.5–2.5)

## 2014-05-30 LAB — PHOSPHORUS: Phosphorus: 4.7 mg/dL — ABNORMAL HIGH (ref 2.3–4.6)

## 2014-05-30 MED ORDER — MORPHINE SULFATE 25 MG/ML IV SOLN
10.0000 mg/h | INTRAVENOUS | Status: DC
  Administered 2014-05-30: 10 mg/h via INTRAVENOUS
  Filled 2014-05-30: qty 10

## 2014-05-30 MED ORDER — MORPHINE BOLUS VIA INFUSION
5.0000 mg | INTRAVENOUS | Status: DC | PRN
  Filled 2014-05-30: qty 20

## 2014-05-31 ENCOUNTER — Telehealth: Payer: Self-pay

## 2014-05-31 NOTE — Telephone Encounter (Signed)
Received death certificate 8/75/79 from [facility].  Will be for Dr. Titus Mould.  Will send to 3100 06/01/14 for signature.  Received back signed by Dr. Nelda Marseille 06/01/14 and called [facility] for pick-up.

## 2014-06-07 NOTE — Discharge Summary (Signed)
NAME:  Justin Gilmore, Justin Gilmore NO.:  [id number]  MEDICAL RECORD NO.:  [id number]  LOCATION:                                FACILITY:  [facility]  PHYSICIAN:  Providence Lanius, MD  DATE OF BIRTH:  1945/10/27  DATE OF ADMISSION:  05/24/2014 DATE OF DISCHARGE:  2014/06/22                              DISCHARGE SUMMARY   DEATH SUMMARY  PRIMARY DIAGNOSIS/CAUSE OF DEATH:  Pulseless electrical activity cardiac arrest.  SECONDARY DIAGNOSES:  Anoxic brain injury, cardiogenic shock, chronic obstructive pulmonary disease, acute kidney injury, alcoholic cirrhosis, gastroesophageal reflux disease, perianal abscess, urinary tract infection.  HOSPITAL COURSE:  The patient is a 69 year old with past medical history significant for alcohol abuse presented to the hospital after recent discharge from [facility] for perianal access after being discharged home, the patient had anxiety attack, complained of epigastric discomfort and lost consciousness.  CPR was started, 5 rounds of __________ were given.  __________ showed PA, cardiac arrest.  The patient was admitted to the hospital, where he was noted to have significant anoxic injury.  After discussion with the family, family decided the patient would not want to be maintained alive under this condition.  The patient was made DNR and morphine was started.  After starting the morphine, was extubated at the family's request, expired shortly thereafter.     Providence Lanius, MD     WJY/MEDQ  D:  06/06/2014  T:  06/07/2014  Job:  [id number]

## 2014-06-15 NOTE — Progress Notes (Signed)
PULMONARY / CRITICAL CARE MEDICINE   Name: Justin Gilmore MRN: [id number] DOB: Jul 11, 1945    ADMISSION DATE:  05/26/2014 CONSULTATION DATE:  05/28/14  REFERRING MD :  [facility]  CHIEF COMPLAINT:  Cardiorespiratory arrest  INITIAL PRESENTATION: Patient was just discharged home from [facility] yesterday for abscess and UTI. He was urinating then had an "anxiety" attack + epigastric discomfort. Then he rolled his eyes and passed out. CPR initiated by family members for 10 minutes then followed by EMS for another 20 minutes (total 30 minutes downtime), got 5 epi and 1 calcium. PEA arrest reported.  Lactate 10, troponin minimally elevated. EKG no acute ST elevation. Mild AKI on CKD.  He is a smoker and has significant EtOH intake history  STUDIES:  CXR 05/24/2014: ETT approximately 1 cm above the carina. Extensive parenchymal infiltrate, hemorrhage or edema in the central lung regions.  SIGNIFICANT EVENTS: CPR 05/26/2014  SUBJECTIVE: Head CT with diffuse edema.  VITAL SIGNS: Temp:  [93.9 F (34.4 C)-98.8 F (37.1 C)] 98.8 F (37.1 C) (03/16 0800) Pulse Rate:  [51-106] 103 (03/16 0900) Resp:  [18-20] 20 (03/16 0900) BP: (114-130)/(73-74) 130/74 mmHg (03/16 0824) SpO2:  [99 %-100 %] 100 % (03/16 0900) Arterial Line BP: (74-135)/(48-80) 116/66 mmHg (03/16 0900) FiO2 (%):  [40 %] 40 % (03/16 0824) Weight:  [91.7 kg (202 lb 2.6 oz)] 91.7 kg (202 lb 2.6 oz) (03/16 0500)\  HEMODYNAMICS: CVP:  [5 mmHg-11 mmHg] 10 mmHg VENTILATOR SETTINGS: Vent Mode:  [-] PCV FiO2 (%):  [40 %] 40 % Set Rate:  [20 bmp] 20 bmp PEEP:  [10 cmH20] 10 cmH20 Plateau Pressure:  [20 cmH20-24 cmH20] 23 cmH20  INTAKE / OUTPUT:  Intake/Output Summary (Last 24 hours) at 2014/06/22 0958 Last data filed at 22-Jun-2014 0900  Gross per 24 hour  Intake 4549.52 ml  Output    445 ml  Net 4104.52 ml   PHYSICAL EXAMINATION: General:  Chronically ill appearing male, completely unresponsive. Neuro:  No cough, no gag, no pain  response at all, pupil dilated and fixed, does have a respiratory drive. HEENT:  ET tube in place, no obvious trauma, pupils dilated and non-reactive, no EOM, MMMM Cardiovascular:  RRR, Nl S1/S2, -M/R/G. Lungs:  Coarse BS diffusely. Abdomen:  Distended, no guarding, hypoactive bowel sounds Musculoskeletal:  No gross deformities, has grade 1 edema both legs Skin:  Chronic skin changes both LE, no ecchymosis  LABS:  CBC  Recent Labs Lab 05/28/14 0330  05/28/14 1812 05/29/14 0415 Jun 22, 2014 0433  WBC 13.8*  --   --  11.8* 12.4*  HGB 8.8*  < > 10.2* 8.7* 9.0*  HCT 27.1*  < > 30.0* 26.7* 27.3*  PLT 158  --   --  132* 86*  < > = values in this interval not displayed. Coag's  Recent Labs Lab 06/02/2014 2340 05/28/14 0900  APTT 87* 46*  INR 2.04* 1.54*   BMET  Recent Labs Lab 05/29/14 1145 05/29/14 1600 2014-06-22 0433  NA 138 137 134*  K 3.6 3.7 3.6  CL 110 109 103  CO2 19 20 26   BUN 8 8 9   CREATININE 2.51* 2.55* 2.85*  GLUCOSE 158* 221* 229*   Electrolytes  Recent Labs Lab 05/28/14 0330  05/29/14 0415  05/29/14 1145 05/29/14 1600 2014/06/22 0433  CALCIUM  --   < > 6.6*  < > 6.6* 6.3* 6.4*  MG 1.3*  --  1.8  --   --   --  1.9  PHOS  6.1*  --  5.0*  --   --   --  4.7*  < > = values in this interval not displayed. Sepsis Markers  Recent Labs Lab 06/10/2014 2349 05/28/14 0204  LATICACIDVEN 10.76* 7.6*   ABG  Recent Labs Lab 05/28/14 0331 05/29/14 0330 June 07, 2014 0500  PHART 7.309* 7.311* 7.452*  PCO2ART 17.2* 33.8* 31.9*  PO2ART 82.0 101.0* 87.8   Liver Enzymes  Recent Labs Lab 05/28/14 0204  AST 146*  ALT 57*  ALKPHOS 230*  BILITOT 1.4*  ALBUMIN 1.1*   Cardiac Enzymes  Recent Labs Lab 05/28/14 2000 05/29/14 0215 05/29/14 0800  TROPONINI 0.58* 0.63* 0.69*   Glucose  Recent Labs Lab 05/29/14 1141 05/29/14 1623 05/29/14 2031 05/29/14 2343 June 07, 2014 0352 2014/06/07 0730  GLUCAP 155* 219* 173* 175* 242* 177*   Imaging Dg Chest Port 1  View  05/29/2014   CLINICAL DATA:  Hypoxia/difficulty breathing  EXAM: PORTABLE CHEST - 1 VIEW  COMPARISON:  May 28, 2014  FINDINGS: Endotracheal tube tip is 3.2 cm above the carina. Nasogastric tube tip and side port are below the diaphragm. Central catheter tip is at the junction of the left innominate vein and superior vena cava. No pneumothorax. There is moderate interstitial edema throughout the lungs bilaterally. There is no appreciable airspace consolidation. Heart is mildly enlarged with pulmonary venous hypertension. No adenopathy.  IMPRESSION: Evidence of congestive heart failure, stable. Tube and catheter positions as described without pneumothorax.   Electronically Signed   By: Lowella Grip III M.D.   On: 05/29/2014 07:28   ASSESSMENT / PLAN:  PULMONARY OETT 05/28/14 >>> A: combined hypoxic and hypercarbic resp failure due to arrest COPD. Possible lung contusion from CPR P:   Continue full vet support until family decides on timing of withdrawal. VAP bundle. Schedule duoneb.  CARDIOVASCULAR A: PEA arrest, question of MI vs arrhythmias leading to PEA arrest History of HTN and HLD P:  PRN BP meds, continue statin Cardio consult appreciated, nothing further to offer. Levophed for BP support for now but do not increase, full DNR now.  RENAL A:  AKI on CKD P:   D/C Bicarb drip. D/C blood draws at this point.  GASTROINTESTINAL A:  History of cirrhosis from EtOH abuse History of pancreatic abnormality on CT GERD P:   Monitor, no further interventions.  HEMATOLOGIC A:  Chronic anemia Elevated INR and aPTT P:  D/C blood draws.  INFECTIOUS A:  Recent perianal abscess s/p I&D, UTI P:   D/C abx.  ENDOCRINE A:  No acute issues P:   D/C CBGs. D/C insulin. D/C D10.  NEUROLOGIC A:  Encephalopathy likely from anoxia due to cardiac arrest P:   CT head with diffuse edema. EEG with poor findings. Neurology feels prognosis is very poor. Will likely withdraw  later today. Neurology consult appreciated.  FAMILY  - Updates: Entire family updated bedside, per son's decision will make a full DNR with no further escalation of care.  Family is deciding on when to terminally extubate.  No further titration in levophed and no further blood draws.  - Inter-disciplinary family meet or Palliative Care meeting due by:    The patient is critically ill with multiple organ systems failure and requires high complexity decision making for assessment and support, frequent evaluation and titration of therapies, application of advanced monitoring technologies and extensive interpretation of multiple databases.   Critical Care Time devoted to patient care services described in this note is  45  Minutes. This time reflects  time of care of this signee Dr Jennet Maduro. This critical care time does not reflect procedure time, or teaching time or supervisory time of PA/NP/Med student/Med Resident etc but could involve care discussion time.  [name], M.D. [facility] Pulmonary/Critical Care Medicine. Pager: [phone]. After hours pager: [phone].  2014/06/08, 9:58 AM

## 2014-06-15 NOTE — Progress Notes (Signed)
Family ready for withdrawal.  Will place withdrawal order set.  [name], M.D. [facility] Pulmonary/Critical Care Medicine. Pager: [phone]. After hours pager: [phone].

## 2014-06-15 NOTE — Progress Notes (Signed)
ETT removed. Pt not breathing.  Morphine gtt at 12 mL/Hr  Family at bedside.

## 2014-06-15 NOTE — Progress Notes (Signed)
CRITICAL VALUE ALERT  Critical value received:  Ca: 6.4  Date of notification:  05/29/13  Time of notification:  8527  Critical value read back:Yes.    Nurse who received alert:  Ellsworth Lennox  MD notified : Warren Lacy  Time of first page: [phone]

## 2014-06-15 NOTE — Progress Notes (Signed)
Applied by ED RN

## 2014-06-15 NOTE — Progress Notes (Addendum)
06-07-14 1305  Pt without resp.heartbeat, pusle. No electrical activity on monitor, Verified by 2 nurses. Family at bedside Comfort expressed to all.  Justin Gilmore/ .

## 2014-06-15 NOTE — Progress Notes (Signed)
**Note De-Identified  Obfuscation** Patient extubated per Withdrawal of Life-Sustaining protocol

## 2014-06-15 NOTE — Progress Notes (Signed)
   2014-06-27 1300  Clinical Encounter Type  Visited With Patient and family together  Visit Type Initial;Follow-up;Death;Patient actively dying  Spiritual Encounters  Spiritual Needs Emotional;Grief support  Stress Factors  Family Stress Factors Loss   Chaplain was paged to patient's room at 11:40 AM. Chaplain was notified that the medical team planned to take the patient off of the vent today. When chaplain arrived patient's son and two brothers were present. Patient's son travelled from out of town to be with his father during this time. Family shared a few stories while chaplain was present. Patient's family members that were present were waiting on more family to arrive. Chaplain said he would follow up with them after lunch. Chaplain has followed up and the patient has since passed away. Patient's family appears to be calm and well supportive of each other as they wait to spend some more time at the patient's bedside. Chaplain will continue to provide grief support for the patient's family as needed. Abdulkareem Badolato, Claudius Sis, Chaplain  1:44 PM

## 2014-06-15 NOTE — Plan of Care (Signed)
Problem: Phase II Progression Outcomes Goal: Extubated and maintains O2 sats > 92% Outcome: Completed/Met Date Met:  06-07-14 DNR, withdrawal and comfort care protocol

## 2014-06-15 DEATH — deceased

## 2014-07-06 ENCOUNTER — Ambulatory Visit: Payer: Medicare Other | Admitting: Family

## 2014-07-06 ENCOUNTER — Encounter (HOSPITAL_COMMUNITY): Payer: Medicare Other

## 2023-05-15 DEATH — deceased
